# Patient Record
Sex: Female | Born: 1975 | Hispanic: No | Marital: Married | Smoking: Never smoker
Health system: Southern US, Community
[De-identification: ages and names within clinical notes are randomized; demographics above are authoritative.]

## PROBLEM LIST (undated history)

## (undated) DIAGNOSIS — N939 Abnormal uterine and vaginal bleeding, unspecified: Secondary | ICD-10-CM

## (undated) DIAGNOSIS — G629 Polyneuropathy, unspecified: Secondary | ICD-10-CM

## (undated) DIAGNOSIS — F329 Major depressive disorder, single episode, unspecified: Secondary | ICD-10-CM

## (undated) DIAGNOSIS — L630 Alopecia (capitis) totalis: Secondary | ICD-10-CM

## (undated) DIAGNOSIS — E282 Polycystic ovarian syndrome: Secondary | ICD-10-CM

## (undated) DIAGNOSIS — Z973 Presence of spectacles and contact lenses: Secondary | ICD-10-CM

## (undated) DIAGNOSIS — D649 Anemia, unspecified: Secondary | ICD-10-CM

## (undated) DIAGNOSIS — M1990 Unspecified osteoarthritis, unspecified site: Secondary | ICD-10-CM

## (undated) DIAGNOSIS — G4733 Obstructive sleep apnea (adult) (pediatric): Secondary | ICD-10-CM

## (undated) DIAGNOSIS — G43909 Migraine, unspecified, not intractable, without status migrainosus: Secondary | ICD-10-CM

## (undated) DIAGNOSIS — K219 Gastro-esophageal reflux disease without esophagitis: Secondary | ICD-10-CM

## (undated) DIAGNOSIS — I10 Essential (primary) hypertension: Secondary | ICD-10-CM

## (undated) DIAGNOSIS — F419 Anxiety disorder, unspecified: Secondary | ICD-10-CM

## (undated) DIAGNOSIS — J302 Other seasonal allergic rhinitis: Secondary | ICD-10-CM

## (undated) DIAGNOSIS — E119 Type 2 diabetes mellitus without complications: Secondary | ICD-10-CM

## (undated) DIAGNOSIS — T7840XA Allergy, unspecified, initial encounter: Secondary | ICD-10-CM

## (undated) DIAGNOSIS — F32A Depression, unspecified: Secondary | ICD-10-CM

## (undated) DIAGNOSIS — M069 Rheumatoid arthritis, unspecified: Secondary | ICD-10-CM

## (undated) DIAGNOSIS — B0229 Other postherpetic nervous system involvement: Secondary | ICD-10-CM

## (undated) HISTORY — PX: DILATION AND CURETTAGE OF UTERUS: SHX78

## (undated) HISTORY — DX: Allergy, unspecified, initial encounter: T78.40XA

## (undated) HISTORY — PX: CHOLECYSTECTOMY: SHX55

## (undated) HISTORY — DX: Anxiety disorder, unspecified: F41.9

## (undated) HISTORY — DX: Major depressive disorder, single episode, unspecified: F32.9

## (undated) HISTORY — DX: Depression, unspecified: F32.A

## (undated) HISTORY — PX: WISDOM TOOTH EXTRACTION: SHX21

## (undated) HISTORY — DX: Essential (primary) hypertension: I10

---

## 1999-07-12 ENCOUNTER — Other Ambulatory Visit: Admission: RE | Admit: 1999-07-12 | Discharge: 1999-07-12 | Payer: Self-pay | Admitting: Obstetrics and Gynecology

## 2000-05-13 ENCOUNTER — Emergency Department (HOSPITAL_COMMUNITY): Admission: EM | Admit: 2000-05-13 | Discharge: 2000-05-14 | Payer: Self-pay | Admitting: *Deleted

## 2000-10-24 HISTORY — PX: NASAL SEPTUM SURGERY: SHX37

## 2000-11-08 ENCOUNTER — Other Ambulatory Visit: Admission: RE | Admit: 2000-11-08 | Discharge: 2000-11-08 | Payer: Self-pay | Admitting: Family Medicine

## 2001-03-06 ENCOUNTER — Encounter: Admission: RE | Admit: 2001-03-06 | Discharge: 2001-05-23 | Payer: Self-pay | Admitting: Internal Medicine

## 2001-09-03 ENCOUNTER — Emergency Department (HOSPITAL_COMMUNITY): Admission: EM | Admit: 2001-09-03 | Discharge: 2001-09-03 | Payer: Self-pay | Admitting: Emergency Medicine

## 2001-10-17 ENCOUNTER — Emergency Department (HOSPITAL_COMMUNITY): Admission: EM | Admit: 2001-10-17 | Discharge: 2001-10-18 | Payer: Self-pay | Admitting: *Deleted

## 2001-10-24 ENCOUNTER — Emergency Department (HOSPITAL_COMMUNITY): Admission: EM | Admit: 2001-10-24 | Discharge: 2001-10-25 | Payer: Self-pay | Admitting: *Deleted

## 2001-12-14 ENCOUNTER — Emergency Department (HOSPITAL_COMMUNITY): Admission: EM | Admit: 2001-12-14 | Discharge: 2001-12-15 | Payer: Self-pay | Admitting: Emergency Medicine

## 2002-01-06 ENCOUNTER — Emergency Department (HOSPITAL_COMMUNITY): Admission: EM | Admit: 2002-01-06 | Discharge: 2002-01-06 | Payer: Self-pay | Admitting: Emergency Medicine

## 2002-02-05 ENCOUNTER — Emergency Department (HOSPITAL_COMMUNITY): Admission: EM | Admit: 2002-02-05 | Discharge: 2002-02-05 | Payer: Self-pay | Admitting: Emergency Medicine

## 2003-04-03 ENCOUNTER — Encounter: Payer: Self-pay | Admitting: Emergency Medicine

## 2003-04-03 ENCOUNTER — Emergency Department (HOSPITAL_COMMUNITY): Admission: EM | Admit: 2003-04-03 | Discharge: 2003-04-03 | Payer: Self-pay | Admitting: Emergency Medicine

## 2003-04-24 ENCOUNTER — Encounter: Payer: Self-pay | Admitting: Surgery

## 2003-04-24 ENCOUNTER — Encounter (INDEPENDENT_AMBULATORY_CARE_PROVIDER_SITE_OTHER): Payer: Self-pay | Admitting: Specialist

## 2003-04-24 ENCOUNTER — Observation Stay (HOSPITAL_COMMUNITY): Admission: RE | Admit: 2003-04-24 | Discharge: 2003-04-25 | Payer: Self-pay | Admitting: Surgery

## 2003-04-24 HISTORY — PX: LAPAROSCOPIC CHOLECYSTECTOMY: SUR755

## 2004-02-18 ENCOUNTER — Emergency Department (HOSPITAL_COMMUNITY): Admission: EM | Admit: 2004-02-18 | Discharge: 2004-02-18 | Payer: Self-pay | Admitting: Emergency Medicine

## 2006-08-08 ENCOUNTER — Ambulatory Visit (HOSPITAL_COMMUNITY): Admission: RE | Admit: 2006-08-08 | Discharge: 2006-08-08 | Payer: Self-pay | Admitting: Family Medicine

## 2007-06-01 ENCOUNTER — Ambulatory Visit (HOSPITAL_COMMUNITY): Admission: RE | Admit: 2007-06-01 | Discharge: 2007-06-01 | Payer: Self-pay | Admitting: Emergency Medicine

## 2008-07-30 ENCOUNTER — Emergency Department (HOSPITAL_COMMUNITY): Admission: EM | Admit: 2008-07-30 | Discharge: 2008-07-30 | Payer: Self-pay | Admitting: Emergency Medicine

## 2010-11-10 LAB — CBC
HCT: 37.9 % (ref 36.0–46.0)
Hemoglobin: 12.3 g/dL (ref 12.0–15.0)
MCH: 25.2 pg — ABNORMAL LOW (ref 26.0–34.0)
MCHC: 32.5 g/dL (ref 30.0–36.0)
MCV: 77.5 fL — ABNORMAL LOW (ref 78.0–100.0)
Platelets: 436 10*3/uL — ABNORMAL HIGH (ref 150–400)
RBC: 4.89 MIL/uL (ref 3.87–5.11)
RDW: 13.8 % (ref 11.5–15.5)
WBC: 12.8 10*3/uL — ABNORMAL HIGH (ref 4.0–10.5)

## 2010-11-10 LAB — BASIC METABOLIC PANEL
BUN: 9 mg/dL (ref 6–23)
CO2: 26 mEq/L (ref 19–32)
Calcium: 9.5 mg/dL (ref 8.4–10.5)
Chloride: 102 mEq/L (ref 96–112)
Creatinine, Ser: 0.62 mg/dL (ref 0.4–1.2)
GFR calc Af Amer: >60 mL/min (ref >60)
GFR calc non Af Amer: >60 mL/min (ref >60)
Glucose, Bld: 108 mg/dL — ABNORMAL HIGH (ref 70–99)
Potassium: 3.6 mEq/L (ref 3.5–5.1)
Sodium: 137 mEq/L (ref 135–145)

## 2010-11-15 ENCOUNTER — Ambulatory Visit (HOSPITAL_COMMUNITY)
Admission: RE | Admit: 2010-11-15 | Discharge: 2010-11-15 | Payer: Self-pay | Source: Home / Self Care | Attending: Obstetrics and Gynecology | Admitting: Obstetrics and Gynecology

## 2010-11-15 HISTORY — PX: HYSTEROSCOPY WITH D & C: SHX1775

## 2010-11-15 NOTE — H&P (Signed)
NAMEMARIO, [name]                 ACCOUNT NO.:  [id number]  MEDICAL RECORD NO.:  [id number]        PATIENT TYPE:  WAMB  LOCATION:                                FACILITY:  [facility]  PHYSICIAN:  Aprille Sawhney A. Richad Ramsay, M.D. DATE OF BIRTH:  04/26/1977  DATE OF ADMISSION:  11/15/2010 DATE OF DISCHARGE:                             HISTORY & PHYSICAL   DIAGNOSES:  Endometrial polyps.  The patient desires a dilation and curettage, hysteroscopy and polypectomy.  The patient is a 35 year old, gravida 1, para 0 who presented to me complaining of very irregular menses.  The patient was without a menses from 2008-2009.  At that time, it was very stressful time in her life because her father passed away.  Her periods restarted in 2009, but she only had 5 periods that year, then her periods restarted in June 2011, and she had a period started September 03, 2010, and she continued to bleed until October 04, 2010.  She said she was changing pads 2 times a day.  Denied any bleeding disorders or shortness of breath or chest pain.  She denies being on any contraception.  No history of fibroids or hormone therapy.  She did have some new medications.  No menopausal symptoms, vaginal discharge, abdominal pain or stress.  The patient had an endometrial biopsy which was significant for chronic endometritis and LEEP secretory endometrium.  Her uterus measures 6.6 x 5.37 x 3.51 with normal ovaries.  On sonohysterogram, there were 2 masses, one measuring 1.2 x 1.16 and two measuring 1.13 x 0.31.  The patient is allergic to [facility] and PLENDIL.  PAST MEDICAL HISTORY:  Significant for diabetes, chronic hypertension and diverticulitis.  Her medications include atenolol, hydrochlorothiazide, Bella-phenobarb, metformin, Zyrtec and vitamin.  SOCIAL HISTORY:  Negative for alcohol, tobacco and drug use.  The patient is married.  PHYSICAL EXAMINATION:  VITAL SIGNS:  She weighs 203 pounds, her height is 5 feet 2  inches, her pulse is 72 and her blood pressure is 110/83. EYES:  Her pupils are equal. EARS:  Her hearing is normal. NECK:  Throat is clear.  She has 4 ball spots in her head.  Thyroid is not enlarged. CARDIOVASCULAR:  Regular rate and rhythm. CHEST:  Clear to auscultation bilaterally. BREASTS:  No masses, discharge, skin changes or nipple retraction. BACK:  No CVA tenderness. ABDOMEN:  Nontender without any masses or organomegaly. EXTREMITIES:  No cyanosis, clubbing or edema. NEUROLOGIC:  Within normal limits. PELVIC:  Full vaginal exam is within normal limits.  Cervix are nontender without any lesions.  Uterus is normal, shape, size, consistency and nontender.  Adnexa has no masses.  Urine pregnancy test was negative.  ASSESSMENT:  Abnormal vaginal bleeding with polyps.  The patient was given the option of endometritis.  PERTINENT LABORATORY DATA:  Her FSH was 7.2, prolactin was 11.9, testosterone was normal at 30.61 and a Pap smear was within normal limits.  The patient was given the option of observation with Provera for 3 months and then repeat sonohysterogram or D and C, hysteroscopy, polypectomy.  She has chosen the latter.  She understands the risk  of, but not limited to bleeding, infection, damage to internal organs such as bowel, bladder and major blood vessels.  She was also referred to Dr. 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     Arleatha Philipps A. Normand Sloop, M.D.     NAD/MEDQ  D:  11/14/2010  T:  11/15/2010  Job:  [id number]  Electronically Signed by Jaymes Graff M.D. on 11/15/2010 03:04:35 PM

## 2010-11-16 LAB — GLUCOSE, CAPILLARY
Glucose-Capillary: 127 mg/dL — ABNORMAL HIGH (ref 70–99)
Glucose-Capillary: 101 mg/dL — ABNORMAL HIGH (ref 70–99)

## 2010-11-16 LAB — HCG, QUANTITATIVE, PREGNANCY: hCG, Beta Chain, Quant, S: <2 m[IU]/mL (ref <5)

## 2010-12-01 NOTE — Op Note (Signed)
  NAME:  [name], CARMICKLE       ACCOUNT NO.:  [id number]  MEDICAL RECORD NO.:  [id number]          PATIENT TYPE:  AMB  LOCATION:  SDC                           [facility]ACILITY:  [facility]  PHYSICIAN:  Chevi Lim A. Xayla Puzio, M.D. DATE OF BIRTH:  February 21, 1976  DATE OF PROCEDURE:  11/15/2010 DATE OF DISCHARGE:                              OPERATIVE REPORT   PREOPERATIVE DIAGNOSES: 1. Endometrial polyps. 2. Irregular vaginal bleeding.  POSTOPERATIVE DIAGNOSIS:  Irregular vaginal bleeding.  PROCEDURE:  D and C hysteroscopy.  SURGEON:  Karry Causer A. Taraji Mungo, MD  ASSISTANTS:  There are no assistants.  ANESTHESIA:  General and local.  FINDINGS:  Scant endometrium.  Both ostia were visualized.  There were no intracavitary lesions.  Endometrial curettings were sent to Pathology.  ESTIMATED BLOOD LOSS:  Minimal.  DEFICIT:  45 mL.  COMPLICATIONS:  There were no complications.  The patient went to PACU in stable condition.  PROCEDURE IN DETAIL:  The patient was taken to the operating room where she was given general LMA anesthesia, placed in dorsal lithotomy position and prepped and draped in a normal sterile fashion.  In-and-out catheter was used to drain the bladder.  The uterus was noted to be anteverted with no adnexal masses.  A bivalve speculum was placed into the vagina.  The anterior lip of the cervix was grasped with single- tooth tenaculum.  Cervical block was obtained with 20 mL of 1% Xylocaine.  The cervix was dilated with Pratt dilators up to 21.  The hysteroscope was placed in to the uterine cavity.  Both the cervix and uterus were noted to be normal.  There was scant endometrium noted. Both ostia were visualized.  There were no intracavitary lesions.  The hysteroscope was removed.  A sharp curettage was done on all 4 walls of the uterus.  The endometrium was obtained and sent to Pathology.  All instruments were removed from the cervix and vagina without difficulty.  The tenaculum was  removed and there was some bleeding at the tenaculum site which was made hemostatic with pressure and silver nitrate.  Sponge, lap, and needle counts were correct.  The patient went to recovery room in stable condition.     Makhia Vosler A. Normand Sloop, M.D.     NAD/MEDQ  D:  11/15/2010  T:  11/16/2010  Job:  [id number]  Electronically Signed by Jaymes Graff M.D. on 12/01/2010 10:15:54 AM

## 2011-03-11 NOTE — Op Note (Signed)
[name], [name]                         ACCOUNT NO.:  [id number]   MEDICAL RECORD NO.:  [id number]                   PATIENT TYPE:  AMB   LOCATION:  DAY                                  FACILITY:  [facility]   PHYSICIAN:  Sandria Bales. Ezzard Standing, M.D.               DATE OF BIRTH:  03/12/1976   DATE OF PROCEDURE:  04/24/2003  DATE OF DISCHARGE:                                 OPERATIVE REPORT   PREOPERATIVE DIAGNOSIS:  Chronic cholecystitis and cholelithiasis.   POSTOPERATIVE DIAGNOSIS:  Chronic cholecystitis and cholelithiasis.   PROCEDURE:  Laparoscopic cholecystectomy with intraoperative cholangiogram.   SURGEON:  Christopher E. Ezzard Standing, M.D.   FIRST ASSISTANT:  ______________.   ANESTHESIA:  General endotracheal.   ESTIMATED BLOOD LOSS:  Minimal.   INDICATION FOR PROCEDURE:  Ms. [name] is a 35 year old white female who has  had symptomatic cholelithiasis.  Discussion carried out with her about  cholecystectomy as treatment for gallbladder disease.  We discussed the  indications and potential complications of the procedure, potential  complications included but not limited to bleeding, infection, bile duct  injury, bowel injury, open surgery.   The patient presents to the operating room for laparoscopic cholecystectomy.   OPERATIVE NOTE:  Patient placed in supine position, given 1 g Ancef at the  initiation of the procedure.  She had PAS stockings in place.  Her abdomen  is prepped with Betadine solution and sterilely draped.   An initial incision into the abdomen, an infraumbilical incision with sharp  dissection carried down to the abdominal cavity.  A 12 mm Hasson trocar was  secured with a 0 Vicryl suture and abdominal exploration carried out.  The  right and left lobes of the liver were unremarkable, the stomach wall was  unremarkable.  The remainder of the bowel was covered with omentum, and  there really was not much other to visualize.  Three additional trocars were  placed, a 10 mm Ethicon trocar at the subxiphoid location and two 5 mm  Ethicon trocars in the right upper quadrant location.   The gallbladder was identified and grasped and rotated cephalad.  The  patient had a healthy anterior and posterior branch of the cystic artery,  which were triply clipped and divided.  I then placed a clip on the  gallbladder side of the cystic duct and an intraoperative cholangiogram was  obtained.  The intraoperative cholangiogram was obtained using cut-off Taut  catheter inserted through a 14-gauge Jelco into the side of the cut cystic  duct.  An intraoperative cholangiogram was obtained using half-strength  Hypaque solution, about 8 mL.  This was injected under fluoroscopy, and this  showed contrast flowing freely into the cystic duct, into the common bile  duct, into the duodenum, and up the hepatic radicles.  This was read to be a  normal intraoperative cholangiogram.  There was no filling defect or mass.   The Taut  catheter was then removed.  The cystic duct was triply Endoclipped  and divided.  The gallbladder was then sharply and bluntly dissected from  the gallbladder bed.  There was a third branch of the vascular system going  up the gallbladder bed posteriorly, and this was clipped and divided.  Prior  to complete division from the gallbladder bed, the triangle of Calot and the  gallbladder bed were visualized.  There was no bleeding, no bile leak.  The  gallbladder was then delivered into an EndoCatch bag and delivered through  the umbilicus.  The patient had at least six or seven almost 2+ cm stones,  and these stones were collected for the patient.  The gallbladder was then  sent to pathology. The abdomen was irrigated with about a liter of saline,  the umbilical wound closed with 3-0 Vicryl suture.  Each trocar site was  infiltrated with 0.25% Marcaine and the skin edge was closed with 5-0  Vicryl, painted with tincture of Benzoin and  steri-stripped.   The patient tolerated the procedure well and was transported to the recovery  room in good condition.                                               Sandria Bales. Ezzard Standing, M.D.    DHN/MEDQ  D:  04/24/2003  T:  04/24/2003  Job:  [id number]   cc:   Harrel Lemon. Merla Riches, M.D.  [address]  [place]  [place] [address]  Fax: [phone]

## 2011-05-03 ENCOUNTER — Other Ambulatory Visit: Payer: Self-pay | Admitting: Internal Medicine

## 2011-05-03 DIAGNOSIS — E229 Hyperfunction of pituitary gland, unspecified: Secondary | ICD-10-CM

## 2011-05-03 DIAGNOSIS — R51 Headache: Secondary | ICD-10-CM

## 2011-05-07 ENCOUNTER — Ambulatory Visit
Admission: RE | Admit: 2011-05-07 | Discharge: 2011-05-07 | Disposition: A | Discharge status: Home / Self Care | Payer: BC Managed Care – PPO | Source: Ambulatory Visit | Attending: Internal Medicine | Admitting: Internal Medicine

## 2011-05-07 DIAGNOSIS — R51 Headache: Secondary | ICD-10-CM

## 2011-05-07 DIAGNOSIS — E229 Hyperfunction of pituitary gland, unspecified: Secondary | ICD-10-CM

## 2011-05-07 MED ORDER — GADOBENATE DIMEGLUMINE 529 MG/ML IV SOLN: 15.0000 mL | Freq: Once | INTRAVENOUS | Status: AC | PRN

## 2011-11-20 ENCOUNTER — Ambulatory Visit (INDEPENDENT_AMBULATORY_CARE_PROVIDER_SITE_OTHER): Payer: BC Managed Care – PPO

## 2011-11-20 DIAGNOSIS — G43009 Migraine without aura, not intractable, without status migrainosus: Secondary | ICD-10-CM

## 2011-11-27 ENCOUNTER — Emergency Department (HOSPITAL_COMMUNITY)
Admission: EM | Admit: 2011-11-27 | Discharge: 2011-11-28 | Disposition: A | Discharge status: Home / Self Care | Payer: BC Managed Care – PPO | Attending: Emergency Medicine | Admitting: Emergency Medicine

## 2011-11-27 ENCOUNTER — Encounter (HOSPITAL_COMMUNITY): Payer: Self-pay | Admitting: *Deleted

## 2011-11-27 DIAGNOSIS — G43909 Migraine, unspecified, not intractable, without status migrainosus: Secondary | ICD-10-CM | POA: Insufficient documentation

## 2011-11-27 MED ORDER — METOCLOPRAMIDE HCL 5 MG/ML IJ SOLN
10.0000 mg | Freq: Once | INTRAMUSCULAR | Status: AC
  Administered 2011-11-27: 10 mg via INTRAVENOUS
  Filled 2011-11-27: qty 2

## 2011-11-27 MED ORDER — HYDROMORPHONE HCL PF 1 MG/ML IJ SOLN
1.0000 mg | Freq: Once | INTRAMUSCULAR | Status: AC
  Administered 2011-11-27: 1 mg via INTRAVENOUS
  Filled 2011-11-27: qty 1

## 2011-11-27 MED ORDER — DIPHENHYDRAMINE HCL 50 MG/ML IJ SOLN
25.0000 mg | Freq: Once | INTRAMUSCULAR | Status: AC
Start: 2011-11-27 — End: 2011-11-27
  Administered 2011-11-27: 25 mg via INTRAVENOUS
  Filled 2011-11-27: qty 1

## 2011-11-27 NOTE — ED Provider Notes (Signed)
36 year old female with a history of migraines had onset this morning of a bifrontal headache typical of her migraines. Pain is severe and throbbing. There's been associated nausea and vomiting. She will be given IV fluids and a headache cocktail and reassessed.  Dione Booze, MD 11/27/11 [phone]

## 2011-11-27 NOTE — ED Provider Notes (Signed)
History     CSN: [id number]  Arrival date & time 11/27/11  2223   First MD Initiated Contact with Patient 11/27/11 2248      Chief Complaint  Patient presents with  . Migraine    (Consider location/radiation/quality/duration/timing/severity/associated sxs/prior treatment) HPI Comments: Patient with history of migraine headaches presents with frontal migraine that started yesterday. The pain is described as throbbing. It is similar to previous headaches. Patient has taken her typical prescribed medications for migraines but these have not helped. She also took a Zofran and Vicodin today. Patient had a negative MRI last year. Patient has photophobia and phonophobia. No vomiting. Denies head injury. Patient states that dilaudid shots have worked well for her in the past. She has also been treated with Nubain in the past. She is not recall if she has been treated with Reglan.  Patient is a 36 y.o. female presenting with migraine. The history is provided by the patient.  Migraine This is a recurrent problem. The current episode started yesterday. The problem has been unchanged. Associated symptoms include headaches and nausea. Pertinent negatives include no fever, neck pain, numbness, vomiting or weakness. The symptoms are aggravated by nothing.    Past Medical History  Diagnosis Date  . Migraines     History reviewed. No pertinent past surgical history.  History reviewed. No pertinent family history.  History  Substance Use Topics  . Smoking status: Not on file  . Smokeless tobacco: Not on file  . Alcohol Use:     OB History    Grav Para Term Preterm Abortions TAB SAB Ect Mult Living                  Review of Systems  Constitutional: Negative for fever.  HENT: Negative for neck pain and neck stiffness.   Eyes: Positive for photophobia. Negative for pain and visual disturbance.  Gastrointestinal: Positive for nausea. Negative for vomiting.  Neurological: Positive for  headaches. Negative for dizziness, weakness, light-headedness and numbness.  Psychiatric/Behavioral: Negative for confusion.    Allergies  Review of patient's allergies indicates no known allergies.  Home Medications   Current Outpatient Rx  Name Route Sig Dispense Refill  . ATENOLOL 25 MG PO TABS Oral Take 25 mg by mouth daily.    Marland Kitchen OMEPRAZOLE 10 MG PO CPDR Oral Take 10 mg by mouth daily.      BP 145/97  Temp(Src) 98.4 F (36.9 C) (Oral)  Resp 19  SpO2 100%  LMP 11/15/2011  Physical Exam  Nursing note and vitals reviewed. Constitutional: She is oriented to person, place, and time. She appears well-developed and well-nourished.  HENT:  Head: Normocephalic and atraumatic.  Right Ear: External ear normal.  Left Ear: External ear normal.  Nose: Nose normal.  Mouth/Throat: Oropharynx is clear and moist.  Eyes: Conjunctivae and EOM are normal. Pupils are equal, round, and reactive to light. Right eye exhibits no discharge. Left eye exhibits no discharge.  Neck: Normal range of motion. Neck supple.  Cardiovascular: Normal rate, regular rhythm and normal heart sounds.   Pulmonary/Chest: Effort normal and breath sounds normal.  Neurological: She is alert and oriented to person, place, and time. She has normal strength. No cranial nerve deficit or sensory deficit. Coordination normal. GCS eye subscore is 4. GCS verbal subscore is 5. GCS motor subscore is 6.  Skin: Skin is warm and dry.  Psychiatric: She has a normal mood and affect.    ED Course  Procedures (including critical care time)  Labs Reviewed - No data to display No results found.   1. Migraine     11:05 PM Patient seen and examined. Medications ordered.   Vital signs reviewed and are as follows: Filed Vitals:   11/27/11 2240  BP: 145/97  Temp: 98.4 F (36.9 C)  Resp: 19   12:28 AM patient seen and reexamined. Exam stable. With treatments the pain decreased to 1/10. Patient states that she is ready for  discharge. Urged followup with her primary care physician for management of her headaches. She verbalizes understanding and agrees with plan.   MDM  Migraine, typical of previous migraines. Pain controlled in emergency department. Patient has followup for migraines. No concern for intracranial injury or process that is causing her headaches. Patient had MRI of her head that was normal in the past year.        Carolee Rota, [place] 11/28/11 0030

## 2011-11-27 NOTE — ED Notes (Signed)
Pt has a severe headache that is similar to the migraines she has experienced since she was 17 and has been diagnosed with and is treated for.  Pt states that she has used her usual medications and nothing has worked so far.

## 2011-11-28 NOTE — ED Provider Notes (Signed)
Medical screening examination/treatment/procedure(s) were conducted as a shared visit with non-physician practitioner(s) and myself.  I personally evaluated the patient during the encounter   Dione Booze, MD 11/28/11 [phone]

## 2011-12-13 ENCOUNTER — Other Ambulatory Visit: Payer: Self-pay | Admitting: Internal Medicine

## 2011-12-23 DIAGNOSIS — Z0271 Encounter for disability determination: Secondary | ICD-10-CM

## 2011-12-28 ENCOUNTER — Encounter: Payer: Self-pay | Admitting: Internal Medicine

## 2012-01-27 ENCOUNTER — Ambulatory Visit (INDEPENDENT_AMBULATORY_CARE_PROVIDER_SITE_OTHER): Payer: BC Managed Care – PPO | Admitting: Physician Assistant

## 2012-01-27 VITALS — BP 147/86 | HR 105 | Temp 98.1°F | Resp 18 | Ht 62.5 in | Wt 207.0 lb

## 2012-01-27 DIAGNOSIS — H9209 Otalgia, unspecified ear: Secondary | ICD-10-CM

## 2012-01-27 DIAGNOSIS — H6500 Acute serous otitis media, unspecified ear: Secondary | ICD-10-CM

## 2012-01-27 DIAGNOSIS — H6590 Unspecified nonsuppurative otitis media, unspecified ear: Secondary | ICD-10-CM

## 2012-01-27 MED ORDER — IPRATROPIUM BROMIDE 0.06 % NA SOLN: 2.0000 | Freq: Three times a day (TID) | NASAL | Status: DC

## 2012-01-27 NOTE — Progress Notes (Signed)
[name] MRN: [id number], DOB: 12-02-1975, 36 y.o. Date of Encounter: 01/27/2012, 4:04 PM  Primary Physician: No primary provider on file.  Chief Complaint:  Chief Complaint  Patient presents with  . Otalgia    left ear pain for 3 to 4 days    HPI: 36 y.o. year old female presents with a 3-4 day history of left greater than right otalgia. Typically with allergies this time of year. Symptoms began after cleaning a very dusty/dirty room in her house. Afebrile. Mild rhinorrhea. No cough. Left ear more painful than the right ear, but both feel like she is swimming under water, leading to sensation of muffled hearing. No drainage or discharge from affected ear. Has tried OTC cold preps without success. No GI complaints. Appetite normal.  No sick contacts, recent antibiotics, or recent travels.    No past medical history on file.   Home Meds: Prior to Admission medications   Medication Sig Start Date End Date Taking? Authorizing Provider  atenolol (TENORMIN) 25 MG tablet Take 25 mg by mouth daily. Takes 1/2 pill   Yes Historical Provider, MD  cetirizine (ZYRTEC) 10 MG tablet Take 10 mg by mouth daily.   Yes Historical Provider, MD  drospirenone-ethinyl estradiol (YAZ,GIANVI,LORYNA) 3-0.02 MG tablet Take 1 tablet by mouth daily.   Yes Historical Provider, MD  eletriptan (RELPAX) 20 MG tablet One tablet by mouth at onset of headache. May repeat in 2 hours if headache persists or recurs. may repeat in 2 hours if necessary   Yes Historical Provider, MD  HYDROcodone-acetaminophen (NORCO) 5-325 MG per tablet Take 1 tablet by mouth every 6 (six) hours as needed.   Yes Historical Provider, MD  omeprazole (PRILOSEC) 40 MG capsule Take 40 mg by mouth daily.   Yes Historical Provider, MD  ipratropium (ATROVENT) 0.06 % nasal spray Place 2 sprays into the nose 3 (three) times daily. 01/27/12 01/26/13  Sondra Barges, [name]    Allergies:  Allergies  Allergen Reactions  . Darvocet  (Propoxacet-N)   . Imitrex (Sumatriptan Base)   . Plendil     History   Social History  . Marital Status: Married    Spouse Name: N/A    Number of Children: N/A  . Years of Education: N/A   Occupational History  . Not on file.   Social History Main Topics  . Smoking status: Never Smoker   . Smokeless tobacco: Not on file  . Alcohol Use: Not on file  . Drug Use: Not on file  . Sexually Active: Not on file   Other Topics Concern  . Not on file   Social History Narrative  . No narrative on file     Review of Systems: Constitutional: negative for chills, fever, night sweats or weight changes HEENT: see above Respiratory: negative for hemoptysis, wheezing, or shortness of breath Abdominal: negative for abdominal pain, nausea, vomiting or diarrhea Dermatological: negative for rash Neurologic: negative for headache   Physical Exam: Blood pressure 147/86, pulse 105, temperature 98.1 F (36.7 C), temperature source Oral, resp. rate 18, height 5' 2.5" (1.588 m), weight 207 lb (93.895 kg)., Body mass index is 37.26 kg/(m^2). General: Well developed, well nourished, in no acute distress. Head: Normocephalic, atraumatic, eyes without discharge, sclera non-icteric, nares are congested. Bilateral auditory canals clear. Bilateral TM's with serous effusion behind. No perforation visualized. TM's pearly grey bilaterally. Oral cavity moist, dentition normal. Posterior pharynx with post nasal drip. No erythema, peritonsillar abscess, or tonsillar  exudate. Neck: Supple. No thyromegaly. Full ROM. No lymphadenopathy. Lungs: Clear bilaterally to auscultation without wheezes, rales, or rhonchi. Breathing is unlabored.  Heart: RRR with S1 S2. No murmurs, rubs, or gallops appreciated. Msk:  Strength and tone normal for age. Extremities: No clubbing or cyanosis. No edema. Neuro: Alert and oriented X 3. Moves all extremities spontaneously. CNII-XII grossly in tact. Psych:  Responds to questions  appropriately with a normal affect.     ASSESSMENT AND PLAN:  36 y.o. year old female with acute serous otitis media of nilateral ears without perforation or infection. -Atrovent NS 0.06% 2 sprays each nare bid prn #1 RF 2 -Mucinex  -Tylenol prn -Rest/fluids -RTC precautions -RTC 3 days if no improvement  SignedEula Listen, [name] 01/27/2012 4:04 PM

## 2012-02-15 ENCOUNTER — Other Ambulatory Visit: Payer: Self-pay | Admitting: Family Medicine

## 2012-02-15 MED ORDER — HYDROCODONE-ACETAMINOPHEN 5-325 MG PO TABS
1.0000 | ORAL_TABLET | Freq: Four times a day (QID) | ORAL | Status: DC | PRN
Start: 2012-02-15 — End: 2012-10-18

## 2012-02-28 ENCOUNTER — Ambulatory Visit: Payer: BC Managed Care – PPO

## 2012-02-28 ENCOUNTER — Ambulatory Visit (INDEPENDENT_AMBULATORY_CARE_PROVIDER_SITE_OTHER): Payer: BC Managed Care – PPO | Admitting: Internal Medicine

## 2012-02-28 VITALS — BP 135/88 | HR 123 | Temp 98.9°F | Resp 18 | Ht 62.5 in | Wt 207.0 lb

## 2012-02-28 DIAGNOSIS — R05 Cough: Secondary | ICD-10-CM

## 2012-02-28 DIAGNOSIS — G4700 Insomnia, unspecified: Secondary | ICD-10-CM

## 2012-02-28 DIAGNOSIS — G43909 Migraine, unspecified, not intractable, without status migrainosus: Secondary | ICD-10-CM

## 2012-02-28 DIAGNOSIS — J9801 Acute bronchospasm: Secondary | ICD-10-CM

## 2012-02-28 DIAGNOSIS — R42 Dizziness and giddiness: Secondary | ICD-10-CM

## 2012-02-28 MED ORDER — ALBUTEROL SULFATE HFA 108 (90 BASE) MCG/ACT IN AERS: 2.0000 | INHALATION_SPRAY | Freq: Four times a day (QID) | RESPIRATORY_TRACT | Status: DC | PRN

## 2012-02-28 MED ORDER — HYDROCODONE-ACETAMINOPHEN 5-325 MG PO TABS: 1.0000 | ORAL_TABLET | Freq: Four times a day (QID) | ORAL | Status: DC | PRN

## 2012-02-28 MED ORDER — ATENOLOL 25 MG PO TABS: 25.0000 mg | ORAL_TABLET | Freq: Every day | ORAL | Status: DC

## 2012-02-28 MED ORDER — CLONAZEPAM 0.5 MG PO TABS: 0.5000 mg | ORAL_TABLET | Freq: Every evening | ORAL | Status: DC | PRN

## 2012-02-28 NOTE — Progress Notes (Signed)
  Subjective:    [name], female    DOB: [birth year], 36 y.o.   MRN: [id number]  HPI Episodes of feeling shakey and dizzy 3 times this morning. First episode was in the shower. She suspected hypoglycemia and took sugar and lay down. The symptoms resolved but then recurred when she got up to move. There was no chest pain shortness of breath or palpitations. Her vision was not altered. She is not having a migraine headache. The second episode resolve with rest but then it happened again before her husband got home.  Recent cough For 5-7 days-nonproductive-no dyspnea on exertion or shortness of breath-occasionally worse at night. 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 So sleeping only 4-5 hours a night and feeling stressed.  Chest tightness Noted for the last 2 days episodically without clear shortness of breath.  C. Allergic problems causing serous otitis last month that resolved with treatment. 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 Now her hair seems to be starting to regrow After treatment with Dr. Zella Ball acupunc/adjustments/probiotics/Supplements-he wants her to avoid all prednisone and although biotics is much as possible  Her migraine headaches have been good except for one problematic. From April 21 to the 25th when she couldn't get the headache to break and she had to miss work for 4 days     Objective:   Physical Exam Vital signs revealed pulse to be 123/recheck in exam room is 105 HEENT is clear including pupils and extraocular movements/nares are boggy without purulent discharge The heart is regular without murmurs rubs or gallops/her heart was rechecked several times during the  exam and the pulse continued to come down until an accident it was 82 Lungs clear except very mild wheeze with forced expiration Neurological-cranial nerves II through XII are intact Romberg negative, deep tendon reflexes symmetrical   [facility] reading (PRIMARY) by  Dr. Yamir Carignan=No active infiltrate/no cardiomegaly    Assessment & Plan:  Problem #1 allergic rhinitis with mild allergic bronchospasm 2 use Ventolin as needed Continue Atrovent nasal spray and Zyrtec  Problem #2 dizziness episodes related to sleep deprivation Klonopin 0.5 mg at bedtime to ensure 7-8 hours sleep sessions She will work on decreasing her stress  Problem #3 migraine headaches Refill hydrocodone and Tenormin  Followup in 6 days if not well

## 2012-03-25 ENCOUNTER — Ambulatory Visit (INDEPENDENT_AMBULATORY_CARE_PROVIDER_SITE_OTHER): Payer: BC Managed Care – PPO | Admitting: Internal Medicine

## 2012-03-25 VITALS — BP 133/83 | HR 99 | Temp 99.3°F | Resp 16 | Ht 62.5 in | Wt 206.0 lb

## 2012-03-25 DIAGNOSIS — E119 Type 2 diabetes mellitus without complications: Secondary | ICD-10-CM | POA: Insufficient documentation

## 2012-03-25 DIAGNOSIS — H9209 Otalgia, unspecified ear: Secondary | ICD-10-CM

## 2012-03-25 DIAGNOSIS — K219 Gastro-esophageal reflux disease without esophagitis: Secondary | ICD-10-CM | POA: Insufficient documentation

## 2012-03-25 DIAGNOSIS — G43009 Migraine without aura, not intractable, without status migrainosus: Secondary | ICD-10-CM

## 2012-03-25 DIAGNOSIS — Z6837 Body mass index (BMI) 37.0-37.9, adult: Secondary | ICD-10-CM | POA: Insufficient documentation

## 2012-03-25 DIAGNOSIS — L658 Other specified nonscarring hair loss: Secondary | ICD-10-CM

## 2012-03-25 DIAGNOSIS — H9203 Otalgia, bilateral: Secondary | ICD-10-CM

## 2012-03-25 DIAGNOSIS — G43909 Migraine, unspecified, not intractable, without status migrainosus: Secondary | ICD-10-CM | POA: Insufficient documentation

## 2012-03-25 DIAGNOSIS — L630 Alopecia (capitis) totalis: Secondary | ICD-10-CM | POA: Insufficient documentation

## 2012-03-25 DIAGNOSIS — E229 Hyperfunction of pituitary gland, unspecified: Secondary | ICD-10-CM | POA: Insufficient documentation

## 2012-03-25 DIAGNOSIS — E221 Hyperprolactinemia: Secondary | ICD-10-CM | POA: Insufficient documentation

## 2012-03-25 DIAGNOSIS — E282 Polycystic ovarian syndrome: Secondary | ICD-10-CM | POA: Insufficient documentation

## 2012-03-25 DIAGNOSIS — J301 Allergic rhinitis due to pollen: Secondary | ICD-10-CM | POA: Insufficient documentation

## 2012-03-25 DIAGNOSIS — E249 Cushing's syndrome, unspecified: Secondary | ICD-10-CM | POA: Insufficient documentation

## 2012-03-25 NOTE — Progress Notes (Signed)
  Subjective:    [name], female    DOB: May 15, 1976, 36 y.o.   MRN: [id number]  [facility] been using headphones at work and has ear pain bilaterally/no change in hearing/no popping/serous otitis resolved with treatment in April/allergies have improved but still require daily therapy  Migraine headaches still require FMLA and new forms needed today-headaches still occur 2-4 times a month requiring one to 3 days to recover-she did respond to axert but it leaves her too sleepy to drive home from work  She enjoys her job has less stress from the job now  Has been seeing acupuncture for autoimmune hair loss and is having some recovery of underarm hair Her GYN and endocrinology consults see no reason for further hormone testing and see no other options for treatment besides intermittent steroids-she got after Iatrogenic Cushing's and does not want any steroids any more  Hemoglobin A1c was below 6 at Dr. Daune Perch office    Review of Systems  Constitutional: Negative for activity change and appetite change.       Still gets easily fatigued but has been very sedentary in the past few years  HENT: Negative for hearing loss, neck stiffness, tinnitus and ear discharge.   Eyes: Negative for visual disturbance.  Respiratory: Negative for shortness of breath and wheezing.   Cardiovascular: Negative for palpitations and leg swelling.  Psychiatric/Behavioral:       Insomnia responded well to treatment and is now intact       Objective:   Physical Exam Stable vital signs Both TMs are clear with no evidence of fluid/both canals are clear/nontender to palpation/no regional nodes Nares with watery discharge Throat clear Heart regular without murmur No peripheral edema Neurological intact Has newly grown axillary hair       Assessment & Plan:   1. Polycystic ovaries - GI intolerance to metformin/no other reason to treat at this point/they are discussing childbearing  2.  BMI 37.0-37.9,adult   3. Alopecia areata totalis -Continue acupuncture/remember that both testosterone and prolactin have been elevated with negative workups beyond that but may need reevaluation in the future  4. Allergic rhinitis due to pollen-Add Sudafed for one week to current regimen to see if the otalgia resolves   5. Migraine headache -Continue same protocol except increase atenolol to 50 mg  6. Otalgia of both ears

## 2012-04-01 ENCOUNTER — Emergency Department (HOSPITAL_COMMUNITY)
Admission: EM | Admit: 2012-04-01 | Discharge: 2012-04-01 | Disposition: A | Discharge status: Home / Self Care | Payer: BC Managed Care – PPO | Attending: Emergency Medicine | Admitting: Emergency Medicine

## 2012-04-01 ENCOUNTER — Encounter (HOSPITAL_COMMUNITY): Payer: Self-pay | Admitting: Emergency Medicine

## 2012-04-01 DIAGNOSIS — K219 Gastro-esophageal reflux disease without esophagitis: Secondary | ICD-10-CM | POA: Insufficient documentation

## 2012-04-01 DIAGNOSIS — G43909 Migraine, unspecified, not intractable, without status migrainosus: Secondary | ICD-10-CM | POA: Insufficient documentation

## 2012-04-01 HISTORY — DX: Migraine, unspecified, not intractable, without status migrainosus: G43.909

## 2012-04-01 HISTORY — DX: Polycystic ovarian syndrome: E28.2

## 2012-04-01 HISTORY — DX: Alopecia (capitis) totalis: L63.0

## 2012-04-01 HISTORY — DX: Gastro-esophageal reflux disease without esophagitis: K21.9

## 2012-04-01 MED ORDER — DEXAMETHASONE SODIUM PHOSPHATE 10 MG/ML IJ SOLN
10.0000 mg | Freq: Once | INTRAMUSCULAR | Status: DC
  Filled 2012-04-01: qty 1

## 2012-04-01 MED ORDER — SODIUM CHLORIDE 0.9 % IV BOLUS (SEPSIS)
1000.0000 mL | Freq: Once | INTRAVENOUS | Status: AC
  Administered 2012-04-01: 1000 mL via INTRAVENOUS

## 2012-04-01 MED ORDER — DIPHENHYDRAMINE HCL 50 MG/ML IJ SOLN
25.0000 mg | Freq: Once | INTRAMUSCULAR | Status: AC
  Administered 2012-04-01: 25 mg via INTRAVENOUS
  Filled 2012-04-01: qty 1

## 2012-04-01 MED ORDER — HYDROMORPHONE HCL PF 1 MG/ML IJ SOLN
1.0000 mg | Freq: Once | INTRAMUSCULAR | Status: AC
  Administered 2012-04-01: 1 mg via INTRAVENOUS
  Filled 2012-04-01: qty 1

## 2012-04-01 MED ORDER — METOCLOPRAMIDE HCL 5 MG/ML IJ SOLN
10.0000 mg | Freq: Once | INTRAMUSCULAR | Status: AC
  Administered 2012-04-01: 10 mg via INTRAVENOUS
  Filled 2012-04-01: qty 2

## 2012-04-01 NOTE — ED Notes (Signed)
Pt alert, nad, c/o migraine h/a, onset last week, pt ambulates to triage, room, steady gait noted, speech clear, per states "i need a pain medication", "toradol doesn't work for me", "i cant take dexamethasone, it will give me cushings", pt alert, resp even unlabored, skin pwd, pt states "my Vicodin, relpax, and generic nausea medication not working"

## 2012-04-01 NOTE — ED Notes (Signed)
Pt c/o frontal HA x 1 week. Pt unsure has never had HA last this long, pt has had several visits to PCP tried accupuncture. Pt also states started period 2 days ago, may be hormonal.

## 2012-04-01 NOTE — Discharge Instructions (Signed)

## 2012-04-01 NOTE — ED Provider Notes (Signed)
History     CSN: [id number]  Arrival date & time 04/01/12  2030   First MD Initiated Contact with Patient 04/01/12 2148      Chief Complaint  Patient presents with  . Migraine    (Consider location/radiation/quality/duration/timing/severity/associated sxs/prior treatment) HPI Comments: Typical for prior migraines. Gradual in onset. Not worst headache of life. No associated neurologic symptoms  Patient is a 36 y.o. female presenting with migraine. The history is provided by the patient. No language interpreter was used.  Migraine This is a new problem. The current episode started more than 2 days ago (1 week ago). The problem has been gradually worsening. Associated symptoms include headaches. Pertinent negatives include no chest pain, no abdominal pain and no shortness of breath. Exacerbated by: light and sound. The symptoms are relieved by nothing. Treatments tried: acupuncture and home meds prescribed for migraines without improvement. The treatment provided no relief.    Past Medical History  Diagnosis Date  . Migraine   . Alopecia areata totalis   . Polycystic ovary disease   . GERD (gastroesophageal reflux disease)     Past Surgical History  Procedure Date  . Dilation and curettage of uterus     No family history on file.  History  Substance Use Topics  . Smoking status: Never Smoker   . Smokeless tobacco: Not on file  . Alcohol Use: Yes     occasional    OB History    Grav Para Term Preterm Abortions TAB SAB Ect Mult Living                  Review of Systems  Constitutional: Negative for fever, chills, activity change, appetite change and fatigue.  HENT: Negative for congestion, sore throat, rhinorrhea, neck pain and neck stiffness.   Eyes: Positive for photophobia. Negative for pain and visual disturbance.  Respiratory: Negative for cough and shortness of breath.   Cardiovascular: Negative for chest pain and palpitations.  Gastrointestinal: Positive for  nausea. Negative for vomiting and abdominal pain.  Genitourinary: Negative for dysuria, urgency, frequency and flank pain.  Musculoskeletal: Negative for myalgias, back pain and arthralgias.  Neurological: Positive for headaches. Negative for dizziness, weakness, light-headedness and numbness.  All other systems reviewed and are negative.    Allergies  Darvocet; Felodipine er; and Imitrex  Home Medications   Current Outpatient Rx  Name Route Sig Dispense Refill  . ATENOLOL 25 MG PO TABS Oral Take 1 tablet (25 mg total) by mouth daily. Takes 1/2 pill 30 tablet 5  . CETIRIZINE HCL 10 MG PO TABS Oral Take 10 mg by mouth daily.    Marland Kitchen CLONAZEPAM 0.5 MG PO TABS Oral Take 0.25 mg by mouth daily.    . DROSPIRENONE-ETHINYL ESTRADIOL 3-0.02 MG PO TABS Oral Take 1 tablet by mouth daily.    Marland Kitchen ELETRIPTAN HYDROBROMIDE 20 MG PO TABS Oral One tablet by mouth at onset of headache. May repeat in 2 hours if headache persists or recurs. may repeat in 2 hours if necessary    . HYDROCODONE-ACETAMINOPHEN 5-325 MG PO TABS Oral Take 1 tablet by mouth every 6 (six) hours as needed. 30 tablet 5  . IPRATROPIUM BROMIDE 0.06 % NA SOLN Nasal Place 2 sprays into the nose 3 (three) times daily. 15 mL 2  . OMEPRAZOLE 40 MG PO CPDR Oral Take 40 mg by mouth daily.    . ALBUTEROL SULFATE HFA 108 (90 BASE) MCG/ACT IN AERS Inhalation Inhale 2 puffs into the lungs every 6 (  six) hours as needed for wheezing. 1 Inhaler 2    BP 135/74  Pulse 83  Temp(Src) 98.2 F (36.8 C) (Oral)  Resp 16  SpO2 98%  LMP 03/30/2012  Physical Exam  Nursing note and vitals reviewed. Constitutional: She is oriented to person, place, and time. She appears well-developed and well-nourished. No distress.       Appears slightly uncomfortable  HENT:  Head: Normocephalic and atraumatic.  Mouth/Throat: Oropharynx is clear and moist.  Eyes: Conjunctivae and EOM are normal. Pupils are equal, round, and reactive to light.  Neck: Normal range of  motion. Neck supple.  Cardiovascular: Normal rate, regular rhythm, normal heart sounds and intact distal pulses.  Exam reveals no gallop and no friction rub.   No murmur heard. Pulmonary/Chest: Effort normal and breath sounds normal. No respiratory distress. She exhibits no tenderness.  Abdominal: Soft. Bowel sounds are normal. There is no tenderness. There is no rebound and no guarding.  Musculoskeletal: Normal range of motion. She exhibits no edema and no tenderness.  Neurological: She is alert and oriented to person, place, and time. She has normal strength. No cranial nerve deficit or sensory deficit.  Skin: Skin is warm and dry. No rash noted.    ED Course  Procedures (including critical care time)  Labs Reviewed - No data to display No results found.   1. Migraine       MDM  Migraine headache typical for prior migraines. I have no concern about a malignant cause of headache such as subarachnoid hemorrhage or meningitis. There is no indication for imaging or testing at this time. I administered IV fluids and a headache cocktail. On receiving the medications the patient refused dexamethasone and requested something for pain. I explained that narcotics are not indicated for migraine headaches and that they will resolve and rebound headaches when administered. I explained that this would worsen her overall condition. When given this information the patient still requested medication for pain. I explained that the Reglan and Benadryl would be the therapeutic choice for her migraine however she still requested pain medication. She received a single dose of Dilaudid and was probably discharge in the emergency department. She'll not be discharged home with any additional medications.        Dayton Bailiff, MD 04/01/12 2235

## 2012-05-07 ENCOUNTER — Other Ambulatory Visit: Payer: Self-pay | Admitting: Emergency Medicine

## 2012-05-23 ENCOUNTER — Telehealth: Payer: Self-pay

## 2012-05-23 NOTE — Telephone Encounter (Signed)
Chart is in your box. I don't see where she has been on Clonezapam. Please advise.

## 2012-05-23 NOTE — Telephone Encounter (Signed)
[facility] pls advise on Clonazepam refill

## 2012-05-23 NOTE — Telephone Encounter (Signed)
The [facility] called regarding request for Clonezapam .5mg .  Please call the pharmacy back at [phone].

## 2012-05-23 NOTE — Telephone Encounter (Signed)
Please pull chart.

## 2012-05-24 NOTE — Telephone Encounter (Signed)
I can't find in her chart that this was done in the past Could the pharmacy fax [place] a copy of the written prescription or tell us the date it was written on? She is a long-time patient and has been anxious before It also might be possible that they're making mistaken she is requesting a refill of her hydrocodone

## 2012-05-24 NOTE — Telephone Encounter (Signed)
Left mssg for patient to call back, have verified with pharmacy and this is the medication requested.

## 2012-05-28 NOTE — Telephone Encounter (Signed)
Multiple attempts, patient does not call back. If she has further questions, she was advised to call on message

## 2012-06-23 ENCOUNTER — Ambulatory Visit (INDEPENDENT_AMBULATORY_CARE_PROVIDER_SITE_OTHER): Payer: BC Managed Care – PPO | Admitting: Internal Medicine

## 2012-06-23 VITALS — BP 150/102 | HR 82 | Temp 98.6°F | Resp 18 | Ht 62.5 in | Wt 212.0 lb

## 2012-06-23 DIAGNOSIS — G4700 Insomnia, unspecified: Secondary | ICD-10-CM

## 2012-06-23 DIAGNOSIS — M2550 Pain in unspecified joint: Secondary | ICD-10-CM

## 2012-06-23 LAB — POCT CBC
Granulocyte percent: 66.9 %G (ref 37–80)
HCT, POC: 43.6 % (ref 37.7–47.9)
Hemoglobin: 13.5 g/dL (ref 12.2–16.2)
Lymph, poc: 3.1 (ref 0.6–3.4)
MCH, POC: 24.6 pg — AB (ref 27–31.2)
MCHC: 31 g/dL — AB (ref 31.8–35.4)
MCV: 79.5 fL — AB (ref 80–97)
MID (cbc): 0.5 (ref 0–0.9)
MPV: 9.8 fL (ref 0–99.8)
POC Granulocyte: 7.3 — AB (ref 2–6.9)
POC LYMPH PERCENT: 28.6 %L (ref 10–50)
POC MID %: 4.5 %M (ref 0–12)
Platelet Count, POC: 523 10*3/uL — AB (ref 142–424)
RBC: 5.48 M/uL (ref 4.04–5.48)
RDW, POC: 14 %
WBC: 10.9 10*3/uL — AB (ref 4.6–10.2)

## 2012-06-23 LAB — POCT SEDIMENTATION RATE: POCT SED RATE: 31 mm/hr — AB (ref 0–22)

## 2012-06-23 MED ORDER — CLONAZEPAM 0.5 MG PO TABS: 0.2500 mg | ORAL_TABLET | Freq: Every evening | ORAL | Status: DC | PRN

## 2012-06-23 MED ORDER — MELOXICAM 15 MG PO TABS: 15.0000 mg | ORAL_TABLET | Freq: Every day | ORAL | Status: AC

## 2012-06-23 NOTE — Progress Notes (Signed)
Subjective:    [name], female    DOB: [birth year], 36 y.o.   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 Polycystic ovaries  . Other abnormal glucose  . BMI 37.0-37.9,adult  . Cushing's syndrome-iatrogenic  . Other and unspecified anterior pituitary hyperfunction  . Hyperprolactinemia  . Alopecia areata totalis  . Allergic rhinitis due to pollen  . GERD (gastroesophageal reflux disease)  . Migraine headache  Still has no recovery of hair growth  Review of Systems She has had problems with insomnia recently Her migraine headaches have not been active No fever chills or night sweats No weight loss Blood pressure has been elevated intermittently however at 50 mg of atenolol she will get blood pressures in the 100/60 range and be dizzy with postural changes    Objective:   Physical Exam Filed Vitals:   06/23/12 1551  BP: 150/102  Pulse: 82  Temp: 98.6 F (37 C)  Resp: 18    She is mildly tender over the right forearm and the left epicondyle area/Supination and pronation are intact/wrist range of motion is intact She is tender around the first [facility] joint bilaterally but has good thumb function/tinels negative The right shoulder has mild discomfort at 90 of abduction        Results for orders placed in visit on 06/23/12  POCT CBC      Component Value Range   WBC 10.9 (*) 4.6 - 10.2 K/uL   Lymph, poc 3.1  0.6 - 3.4   POC LYMPH PERCENT 28.6  10 - 50 %L   MID (cbc) 0.5  0 - 0.9   POC  MID % 4.5  0 - 12 %M   POC Granulocyte 7.3 (*) 2 - 6.9   Granulocyte percent 66.9  37 - 80 %G   RBC 5.48  4.04 - 5.48 M/uL   Hemoglobin 13.5  12.2 - 16.2 g/dL   HCT, POC 40.9  81.1 - 47.9 %   MCV 79.5 (*) 80 - 97 fL   MCH, POC 24.6 (*) 27 - 31.2 pg   MCHC 31.0 (*) 31.8 - 35.4 g/dL   RDW, POC 91.4     Platelet Count, POC 523 (*) 142 - 424 K/uL   MPV 9.8  0 - 99.8 fL    Assessment & Plan:   1. Arthralgia of multiple joints  TSH, POCT SEDIMENTATION RATE, ANA, POCT CBC, Comprehensive metabolic panel  2. Insomnia  TSH, T4, Free   Meds ordered this encounter  Medications  . clonazePAM (KLONOPIN) 0.5 MG tablet    Sig: Take 0.5 tablets (0.25 mg total) by mouth at bedtime as needed for anxiety.    Dispense:  30 tablet    Refill:  5  . meloxicam (MOBIC) 15 MG tablet    Sig: Take 1 tablet (15 mg total) by mouth daily.    Dispense:  30 tablet    Refill:  0   Call with lab results and  plan

## 2012-06-24 LAB — COMPREHENSIVE METABOLIC PANEL
ALT: 18 U/L (ref 0–35)
AST: 16 U/L (ref 0–37)
Alkaline Phosphatase: 73 U/L (ref 39–117)
Calcium: 9.5 mg/dL (ref 8.4–10.5)
Chloride: 106 mEq/L (ref 96–112)
Creat: 0.68 mg/dL (ref 0.50–1.10)

## 2012-06-24 LAB — T4, FREE: Free T4: 1.09 ng/dL (ref 0.80–1.80)

## 2012-06-24 LAB — TSH: TSH: 2.029 u[IU]/mL (ref 0.350–4.500)

## 2012-06-27 ENCOUNTER — Encounter: Payer: Self-pay | Admitting: Internal Medicine

## 2012-06-27 LAB — ANA: Anti Nuclear Antibody(ANA): NEGATIVE

## 2012-10-15 ENCOUNTER — Ambulatory Visit: Payer: BC Managed Care – PPO

## 2012-10-15 ENCOUNTER — Ambulatory Visit (INDEPENDENT_AMBULATORY_CARE_PROVIDER_SITE_OTHER): Payer: BC Managed Care – PPO | Admitting: Emergency Medicine

## 2012-10-15 VITALS — BP 134/93 | HR 105 | Temp 98.7°F | Resp 16 | Ht 62.0 in | Wt 212.0 lb

## 2012-10-15 DIAGNOSIS — J111 Influenza due to unidentified influenza virus with other respiratory manifestations: Secondary | ICD-10-CM

## 2012-10-15 DIAGNOSIS — R05 Cough: Secondary | ICD-10-CM

## 2012-10-15 DIAGNOSIS — J9801 Acute bronchospasm: Secondary | ICD-10-CM

## 2012-10-15 DIAGNOSIS — J45909 Unspecified asthma, uncomplicated: Secondary | ICD-10-CM

## 2012-10-15 LAB — POCT INFLUENZA A/B
Influenza A, POC: POSITIVE
Influenza B, POC: NEGATIVE

## 2012-10-15 MED ORDER — ALBUTEROL SULFATE (2.5 MG/3ML) 0.083% IN NEBU
2.5000 mg | INHALATION_SOLUTION | Freq: Once | RESPIRATORY_TRACT | Status: AC
  Administered 2012-10-15: 2.5 mg via RESPIRATORY_TRACT

## 2012-10-15 MED ORDER — OSELTAMIVIR PHOSPHATE 75 MG PO CAPS: 75.0000 mg | ORAL_CAPSULE | Freq: Two times a day (BID) | ORAL | Status: DC

## 2012-10-15 MED ORDER — ALBUTEROL SULFATE HFA 108 (90 BASE) MCG/ACT IN AERS: 2.0000 | INHALATION_SPRAY | Freq: Four times a day (QID) | RESPIRATORY_TRACT | Status: DC | PRN

## 2012-10-15 NOTE — Patient Instructions (Addendum)
Influenza, Adult  Influenza ("the flu") is a viral infection of the respiratory tract. It occurs more often in winter months because people spend more time in close contact with one another. Influenza can make you feel very sick. Influenza easily spreads from person to person (contagious).  CAUSES   Influenza is caused by a virus that infects the respiratory tract. You can catch the virus by breathing in droplets from an infected person's cough or sneeze. You can also catch the virus by touching something that was recently contaminated with the virus and then touching your mouth, nose, or eyes.  SYMPTOMS   Symptoms typically last 4 to 10 days and may include:   Fever.   Chills.   Headache, body aches, and muscle aches.   Sore throat.   Chest discomfort and cough.   Poor appetite.   Weakness or feeling tired.   Dizziness.   Nausea or vomiting.  DIAGNOSIS   Diagnosis of influenza is often made based on your history and a physical exam. A nose or throat swab test can be done to confirm the diagnosis.  RISKS AND COMPLICATIONS  You may be at risk for a more severe case of influenza if you smoke cigarettes, have diabetes, have chronic heart disease (such as heart failure) or lung disease (such as asthma), or if you have a weakened immune system. Elderly people and pregnant women are also at risk for more serious infections. The most common complication of influenza is a lung infection (pneumonia). Sometimes, this complication can require emergency medical care and may be life-threatening.  PREVENTION   An annual influenza vaccination (flu shot) is the best way to avoid getting influenza. An annual flu shot is now routinely recommended for all adults in the [place]  TREATMENT   In mild cases, influenza goes away on its own. Treatment is directed at relieving symptoms. For more severe cases, your caregiver may prescribe antiviral medicines to shorten the sickness. Antibiotic medicines are not effective, because the  infection is caused by a virus, not by bacteria.  HOME CARE INSTRUCTIONS   Only take over-the-counter or prescription medicines for pain, discomfort, or fever as directed by your caregiver.   Use a cool mist humidifier to make breathing easier.   Get plenty of rest until your temperature returns to normal. This usually takes 3 to 4 days.   Drink enough fluids to keep your urine clear or pale yellow.   Cover your mouth and nose when coughing or sneezing, and wash your hands well to avoid spreading the virus.   Stay home from work or school until your fever has been gone for at least 1 full day.  SEEK MEDICAL CARE IF:    You have chest pain or a deep cough that worsens or produces more mucus.   You have nausea, vomiting, or diarrhea.  SEEK IMMEDIATE MEDICAL CARE IF:    You have difficulty breathing, shortness of breath, or your skin or nails turn bluish.   You have severe neck pain or stiffness.   You have a severe headache, facial pain, or earache.   You have a worsening or recurring fever.   You have nausea or vomiting that cannot be controlled.  MAKE SURE YOU:   Understand these instructions.   Will watch your condition.   Will get help right away if you are not doing well or get worse.  Document Released: 10/07/2000 Document Revised: 04/10/2012 Document Reviewed: 01/09/2012  ExitCare Patient Information 2013 [employer]    Asthma, Acute Bronchospasm  Your exam shows you have asthma, or acute bronchospasm that acts like asthma. Bronchospasm means your air passages become narrowed. These conditions are due to inflammation and airway spasm that cause narrowing of the bronchial tubes in the lungs. This causes you to have wheezing and shortness of breath.  CAUSES   Respiratory infections and allergies most often bring on these attacks. Smoking, air pollution, cold air, emotional upsets, and vigorous exercise can also bring them on.   TREATMENT    Treatment is aimed at making the narrowed airways  larger. Mild asthma/bronchospasm is usually controlled with inhaled medicines. Albuterol is a common medicine that you breathe in to open spastic or narrowed airways. Some trade names for albuterol are Ventolin or Proventil. Steroid medicine is also used to reduce the inflammation when an attack is moderate or severe. Antibiotics (medications used to kill germs) are only used if a bacterial infection is present.   If you are pregnant and need to use Albuterol (Ventolin or Proventil), you can expect the baby to move more than usual shortly after the medicine is used.  HOME CARE INSTRUCTIONS    Rest.   Drink plenty of liquids. This helps the mucus to remain thin and easily coughed up. Do not use caffeine or alcohol.   Do not smoke. Avoid being exposed to second-hand smoke.   You play a critical role in keeping yourself in good health. Avoid exposure to things that cause you to wheeze. Avoid exposure to things that cause you to have breathing problems. Keep your medications up-to-date and available. Carefully follow your doctor's treatment plan.   When pollen or pollution is bad, keep windows closed and use an air conditioner go to places with air conditioning. If you are allergic to furry pets or birds, find new homes for them or keep them outside.   Take your medicine exactly as prescribed.   Asthma requires careful medical attention. See your caregiver for follow-up as advised. If you are more than [redacted] weeks pregnant and you were prescribed any new medications, let your Obstetrician know about the visit and how you are doing. Arrange a recheck.  SEEK IMMEDIATE MEDICAL CARE IF:    You are getting worse.   You have trouble breathing. If severe, call 911.   You develop chest pain or discomfort.   You are throwing up or not drinking fluids.   You are not getting better within 24 hours.   You are coughing up yellow, green, brown, or bloody sputum.   You develop a fever over 102 F (38.9 C).   You have  trouble swallowing.  MAKE SURE YOU:    Understand these instructions.   Will watch your condition.   Will get help right away if you are not doing well or get worse.  Document Released: 01/25/2007 Document Revised: 01/02/2012 Document Reviewed: 09/24/2007  [employer] Patient Information 2013 [employer]

## 2012-10-15 NOTE — Progress Notes (Signed)
  Subjective:    [name], female    DOB: 07/15/1976, 36 y.o.   MRN: [id number]  HPI patient became ill on Friday. She started feeling bad with head congestion scratchy throat. She then developed chest congestion cough and wheezing. She's been using for the weekend and has been using her inhaler once a day. She does not have a history of asthma but has used inhalers with illness in the past. She has had some flulike symptoms with aching associated with the scratchy throat and cough.    Review of Systems     Objective:   Physical Exam patient is a wheezing in the room. She has paroxysms of cough. Her neck is supple. Her chest examination reveals bilateral wheezes there are no areas of dullness cardiac exam reveals a regular rate without murmurs or gallops. Extremities are without edema  [facility] reading (PRIMARY) by  Dr. Cleta Alberts poor inspiration but otherwise  Results for orders placed in visit on 10/15/12  POCT INFLUENZA A/B      Component Value Range   Influenza A, POC Positive     Influenza B, POC Negative          Assessment & Plan:  Patient here with probable reactive airways disease. We'll check a flu swab and give an  albuterol treatment. Test is positive we'll treat with albuterol and Tamiflu

## 2012-10-18 ENCOUNTER — Other Ambulatory Visit: Payer: Self-pay | Admitting: Internal Medicine

## 2012-10-18 ENCOUNTER — Ambulatory Visit (INDEPENDENT_AMBULATORY_CARE_PROVIDER_SITE_OTHER): Payer: BC Managed Care – PPO | Admitting: Internal Medicine

## 2012-10-18 VITALS — BP 135/94 | HR 109 | Temp 98.1°F | Resp 16 | Ht 62.0 in | Wt 209.0 lb

## 2012-10-18 DIAGNOSIS — J189 Pneumonia, unspecified organism: Secondary | ICD-10-CM

## 2012-10-18 DIAGNOSIS — J9801 Acute bronchospasm: Secondary | ICD-10-CM

## 2012-10-18 MED ORDER — PREDNISONE 20 MG PO TABS: ORAL_TABLET | ORAL | Status: DC

## 2012-10-18 MED ORDER — AZITHROMYCIN 500 MG PO TABS: 500.0000 mg | ORAL_TABLET | Freq: Every day | ORAL | Status: DC

## 2012-10-19 NOTE — Progress Notes (Signed)
  Subjective:    [name], female    DOB: July 30, 1976, 36 y.o.   MRN: [id number]  HPIsee 12/23 influ A w/ RAD   is somewhat improved, but nail has productive cough with purulent sputum, and anterior chest wall pain with coughing. Continues with marked fatigue. No fever or night sweats. No sinus congestion. Limited response to albuterol with regard to wheezing. Has never had reactive airway disease before although has a history of allergic rhinitis.  Review of Systems No rhinitis No sore throat No palpitations or edema No nausea or vomiting/ No diarrhea No skin rash No headaches or neck stiffness    Objective:   Physical Exam Vital signs stable except overweight Conjunctiva clear TMs clear Nares with clear rhinorrhea Oral pharynx is noninjected No anterior cervical nodes Chest with wheezing bilaterally on expiration/rhonchi at the right base       Assessment & Plan:  Problem #1 pneumonia status post viral influenza Meds ordered this encounter  Medications  . azithromycin (ZITHROMAX) 500 MG tablet    Sig: Take 1 tablet (500 mg total) by mouth daily. For pneumonia    Dispense:  5 tablet    Refill:  0  . predniSONE (DELTASONE) 20 MG tablet    Sig: 3/3/2/2/1/1single daily dose for 6 days    Dispense:  12 tablet    Refill:  0   Continue albuterol Out of work until 10/25/2012 Followup 10/24/2012 if not better/resolved

## 2012-11-10 ENCOUNTER — Ambulatory Visit: Payer: BC Managed Care – PPO

## 2012-11-10 ENCOUNTER — Ambulatory Visit (INDEPENDENT_AMBULATORY_CARE_PROVIDER_SITE_OTHER): Payer: BC Managed Care – PPO | Admitting: Internal Medicine

## 2012-11-10 VITALS — BP 136/96 | HR 94 | Temp 98.5°F | Resp 18 | Wt 211.0 lb

## 2012-11-10 DIAGNOSIS — D72829 Elevated white blood cell count, unspecified: Secondary | ICD-10-CM

## 2012-11-10 DIAGNOSIS — R05 Cough: Secondary | ICD-10-CM

## 2012-11-10 DIAGNOSIS — R059 Cough, unspecified: Secondary | ICD-10-CM

## 2012-11-10 DIAGNOSIS — J9801 Acute bronchospasm: Secondary | ICD-10-CM

## 2012-11-10 LAB — COMPREHENSIVE METABOLIC PANEL
Albumin: 4.6 g/dL (ref 3.5–5.2)
CO2: 24 mEq/L (ref 19–32)
Calcium: 9.6 mg/dL (ref 8.4–10.5)
Chloride: 103 mEq/L (ref 96–112)
Glucose, Bld: 146 mg/dL — ABNORMAL HIGH (ref 70–99)
Potassium: 4.5 mEq/L (ref 3.5–5.3)
Sodium: 138 mEq/L (ref 135–145)
Total Protein: 7.7 g/dL (ref 6.0–8.3)

## 2012-11-10 LAB — POCT CBC
Granulocyte percent: 65.2 %G (ref 37–80)
HCT, POC: 45.8 % (ref 37.7–47.9)
Hemoglobin: 14.1 g/dL (ref 12.2–16.2)
Lymph, poc: 4 — AB (ref 0.6–3.4)
MCHC: 30.8 g/dL — AB (ref 31.8–35.4)
POC Granulocyte: 8.9 — AB (ref 2–6.9)
RBC: 5.68 M/uL — AB (ref 4.04–5.48)

## 2012-11-10 MED ORDER — PREDNISONE 10 MG PO TABS: ORAL_TABLET | ORAL | Status: DC

## 2012-11-10 MED ORDER — MOXIFLOXACIN HCL 400 MG PO TABS: 400.0000 mg | ORAL_TABLET | Freq: Every day | ORAL | Status: DC

## 2012-11-10 NOTE — Progress Notes (Signed)
  Subjective:    [name], female    DOB: 1976-04-18, 37 y.o.   MRN: [id number]  HPIpost hosp for syncope-[facility] via em[facility]//8d ago=1/10 Fever/cough/ht rate 140/leucocytosis No dx but high dose antibios(rocephin and vancomycin alternating Had Resumed work after pneu 1/2 feeling OK , then Cough restarted 1/8-nonproduc but very bothersome No fever or night sweats Feels slightly winded with activity Mild congestion with headaches in the sinus area   Past medical history Patient Active Problem List  Diagnosis  . Polycystic ovaries  . Other abnormal glucose  . BMI 37.0-37.9,adult  . Cushing's syndrome-iatrogenic  . Other and unspecified anterior pituitary hyperfunction  . Hyperprolactinemia  . Alopecia areata totalis  . Allergic rhinitis due to pollen  . GERD (gastroesophageal reflux disease)  . Migraine headache   Current medications include albuterol, Tenormin, omeprazole among others    Review of Systems No fever chills or night sweats No weight loss No pleuritic chest pain No shortness of breath No nausea or vomiting/appetite is decreased No peripheral edema No genitourinary symptoms No skin rash    Objective:   Physical Exam BP 136/96  Pulse 94  Temp 98.5 F (36.9 C) (Oral)  Resp 18  Wt 211 lb (95.709 kg)  SpO2 95%  LMP 08/08/2012 Conjunctiva clear/TMs clear/nares slightly boggy without mucus Throat clear/no cervical adenopathy Neck supple Lungs clear except wheezing bilaterally at the bases with forced expiration Heart regular without murmur Abdomen benign No peripheral edema/good pulses No flank tenderness Neurological intact      [facility] reading (PRIMARY) by  Dr. Aava Deland=NAD  Results for orders placed in visit on 11/10/12  POCT CBC      Component Value Range   WBC 13.6 (*) 4.6 - 10.2 K/uL   Lymph, poc 4.0 (*) 0.6 - 3.4   POC LYMPH PERCENT 29.3  10 - 50 %L   POC Granulocyte 8.9 (*) 2 - 6.9   Granulocyte percent 65.2   37 - 80 %G   RBC 5.68 (*) 4.04 - 5.48 M/uL   Hemoglobin 14.1  12.2 - 16.2 g/dL   HCT, POC 40.9  81.1 - 47.9 %   Platelet Count, POC 538 (*) 142 - 424 K/uL     Assessment & Plan:   1. Cough  POCT CBC, DG Chest 2 View, CT Chest W Contrast, moxifloxacin (AVELOX) 400 MG tablet  2. Leucocytosis  CT Chest W Contrast, Comprehensive metabolic panel, Sedimentation rate  3. Bronchospasm  predniSONE (DELTASONE) 10 MG tablet   This does not appear to be a viral illness/the most likely source of infection for what has occurred would be in the chest so we should proceed with a CT Meds ordered this encounter  Medications  . pseudoephedrine-guaifenesin (MUCINEX D) 60-600 MG per tablet    Sig: Take 1 tablet by mouth every 12 (twelve) hours.  . benzonatate (TESSALON) 100 MG capsule    Sig: Take 100 mg by mouth 2 (two) times daily as needed.  . predniSONE (DELTASONE) 10 MG tablet    Sig: 3/3/2/2/1/1 Single daily dose for 6 days    Dispense:  12 tablet    Refill:  0  . moxifloxacin (AVELOX) 400 MG tablet    Sig: Take 1 tablet (400 mg total) by mouth daily.    Dispense:  7 tablet    Refill:  0    Dr. Mike Craze [phone]  [address] [place]

## 2012-11-11 LAB — SEDIMENTATION RATE: Sed Rate: 28 mm/hr — ABNORMAL HIGH (ref 0–22)

## 2012-11-12 ENCOUNTER — Encounter: Payer: Self-pay | Admitting: Internal Medicine

## 2012-11-14 ENCOUNTER — Encounter: Payer: Self-pay | Admitting: Internal Medicine

## 2012-11-14 ENCOUNTER — Ambulatory Visit
Admission: RE | Admit: 2012-11-14 | Discharge: 2012-11-14 | Disposition: A | Discharge status: Home / Self Care | Payer: BC Managed Care – PPO | Source: Ambulatory Visit | Attending: Internal Medicine | Admitting: Internal Medicine

## 2012-11-14 DIAGNOSIS — D72829 Elevated white blood cell count, unspecified: Secondary | ICD-10-CM

## 2012-11-14 DIAGNOSIS — R05 Cough: Secondary | ICD-10-CM

## 2012-11-14 MED ORDER — IOHEXOL 300 MG/ML  SOLN
75.0000 mL | Freq: Once | INTRAMUSCULAR | Status: AC | PRN
  Administered 2012-11-14: 75 mL via INTRAVENOUS

## 2012-11-25 DIAGNOSIS — Z0271 Encounter for disability determination: Secondary | ICD-10-CM

## 2012-12-20 ENCOUNTER — Other Ambulatory Visit: Payer: Self-pay | Admitting: Internal Medicine

## 2013-01-04 ENCOUNTER — Ambulatory Visit (INDEPENDENT_AMBULATORY_CARE_PROVIDER_SITE_OTHER): Payer: BC Managed Care – PPO | Admitting: Internal Medicine

## 2013-01-04 VITALS — BP 122/84 | HR 84 | Temp 97.9°F | Resp 18 | Ht 62.0 in | Wt 214.0 lb

## 2013-01-04 DIAGNOSIS — G4700 Insomnia, unspecified: Secondary | ICD-10-CM

## 2013-01-04 DIAGNOSIS — G43909 Migraine, unspecified, not intractable, without status migrainosus: Secondary | ICD-10-CM

## 2013-01-04 DIAGNOSIS — R05 Cough: Secondary | ICD-10-CM

## 2013-01-04 DIAGNOSIS — J45909 Unspecified asthma, uncomplicated: Secondary | ICD-10-CM

## 2013-01-04 DIAGNOSIS — R059 Cough, unspecified: Secondary | ICD-10-CM

## 2013-01-04 MED ORDER — CLONAZEPAM 0.5 MG PO TABS: 0.2500 mg | ORAL_TABLET | Freq: Every evening | ORAL | Status: DC | PRN

## 2013-01-04 MED ORDER — HYDROCODONE-ACETAMINOPHEN 5-325 MG PO TABS: 1.0000 | ORAL_TABLET | Freq: Four times a day (QID) | ORAL | Status: DC | PRN

## 2013-01-04 MED ORDER — ALMOTRIPTAN MALATE 12.5 MG PO TABS: ORAL_TABLET | ORAL | Status: DC

## 2013-01-04 MED ORDER — BECLOMETHASONE DIPROPIONATE 80 MCG/ACT IN AERS: 1.0000 | INHALATION_SPRAY | Freq: Two times a day (BID) | RESPIRATORY_TRACT | Status: DC

## 2013-01-04 MED ORDER — ATENOLOL 25 MG PO TABS: 25.0000 mg | ORAL_TABLET | Freq: Every day | ORAL | Status: DC

## 2013-01-04 NOTE — Progress Notes (Signed)
  Subjective:    [name], female    DOB: 05/21/1976, 37 y.o.   MRN: [id number]  HPI  Patient comes in today with paperwork from work to have filled out for her migraines. No history. FMLA for migraines which are serious. Has had a better year home treatment but still has 1-4 headaches per month.   She has been having some coughing and congestion since being in the hospital with pneumonia. Note post hospital treatment with Avelox and prednisone in January. She got well although she  is still coughing. She is not sleeping long. Maybe 2-6 hours a night.   Her grandmother fell and broke her hip, a stroke and a heart attack. Patient has been taking care of her. Patient has been stressed out. Still trying to work. No nasal discharge or trouble breathing at night. Cough wakes her occasionally. No sore throat tho scratchy in the morning. Not hoarse  She needs refills.     Review of Systems No fever chills or night sweats  No chest pain or palpitations  no GI disturbance  no GU symptoms    Objective:   Physical Exam BP 122/84  Pulse 84  Temp(Src) 97.9 F (36.6 C) (Oral)  Resp 18  Ht 5\' 2"  (1.575 m)  Wt 214 lb (97.07 kg)  BMI 39.13 kg/m2  SpO2 98%  LMP 12/28/2012 TMs clear Nares clear Throat clear   no a.c. nodes or thyromegaly Chest clear to auscultation except mild rhonchi anteriorly but clear with cough Neuro intact       Assessment & Plan:    Problem #1 migraine syndrome  FMLA  Refill meds Problem #2 prolonged cough-suspect post infection inflammatory  Start Qvar  problem #3 insomnia  Refill Klonopin  Meds ordered this encounter  Medications  . beclomethasone (QVAR) 80 MCG/ACT inhaler    Sig: Inhale 1 puff into the lungs 2 (two) times daily.    Dispense:  1 Inhaler    Refill:  12  . atenolol (TENORMIN) 25 MG tablet    Sig: Take 1 tablet (25 mg total) by mouth daily. Takes 1/2 pill    Dispense:  30 tablet    Refill:  5  . almotriptan  (AXERT) 12.5 MG tablet    Sig: TAKE 1 TAB AT ONSET OF H/A,MAY REPEAT IN 2 HRS IF NEEDED. MAX.2 COURSES PER WEEK, 4 COURSES PER MONT    Dispense:  8 tablet    Refill:  0  . clonazePAM (KLONOPIN) 0.5 MG tablet    Sig: Take 0.5 tablets (0.25 mg total) by mouth at bedtime as needed for anxiety.    Dispense:  30 tablet    Refill:  5  . HYDROcodone-acetaminophen (NORCO/VICODIN) 5-325 MG per tablet    Sig: Take 1 tablet by mouth every 6 (six) hours as needed.    Dispense:  30 tablet    Refill:  5

## 2013-02-06 ENCOUNTER — Other Ambulatory Visit: Payer: Self-pay

## 2013-02-06 MED ORDER — IPRATROPIUM BROMIDE 0.06 % NA SOLN: 2.0000 | Freq: Three times a day (TID) | NASAL | Status: DC

## 2013-06-04 ENCOUNTER — Other Ambulatory Visit: Payer: Self-pay | Admitting: Internal Medicine

## 2013-06-05 NOTE — Telephone Encounter (Signed)
This RX request was sent to me by mistake; I have routed it to Dr. Merla Riches.

## 2013-06-06 NOTE — Telephone Encounter (Signed)
I phoned in this medication as I do not think the routing that I attempted on 06/05/13 was successful.

## 2013-08-27 ENCOUNTER — Ambulatory Visit (INDEPENDENT_AMBULATORY_CARE_PROVIDER_SITE_OTHER): Payer: BC Managed Care – PPO | Admitting: Internal Medicine

## 2013-08-27 VITALS — BP 110/70 | HR 82 | Temp 97.8°F | Resp 16 | Ht 62.0 in | Wt 217.0 lb

## 2013-08-27 DIAGNOSIS — M25561 Pain in right knee: Secondary | ICD-10-CM

## 2013-08-27 DIAGNOSIS — Z23 Encounter for immunization: Secondary | ICD-10-CM

## 2013-08-27 DIAGNOSIS — R739 Hyperglycemia, unspecified: Secondary | ICD-10-CM

## 2013-08-27 DIAGNOSIS — M25569 Pain in unspecified knee: Secondary | ICD-10-CM

## 2013-08-27 DIAGNOSIS — M2550 Pain in unspecified joint: Secondary | ICD-10-CM

## 2013-08-27 DIAGNOSIS — J019 Acute sinusitis, unspecified: Secondary | ICD-10-CM

## 2013-08-27 DIAGNOSIS — R7309 Other abnormal glucose: Secondary | ICD-10-CM

## 2013-08-27 LAB — POCT CBC
HCT, POC: 39 % (ref 37.7–47.9)
Hemoglobin: 12.1 g/dL — AB (ref 12.2–16.2)
Lymph, poc: 2.7 (ref 0.6–3.4)
MCH, POC: 24.5 pg — AB (ref 27–31.2)
MCHC: 31 g/dL — AB (ref 31.8–35.4)
MCV: 79.2 fL — AB (ref 80–97)
POC LYMPH PERCENT: 20.6 %L (ref 10–50)
RDW, POC: 16.1 %
WBC: 13 10*3/uL — AB (ref 4.6–10.2)

## 2013-08-27 LAB — POCT GLYCOSYLATED HEMOGLOBIN (HGB A1C): Hemoglobin A1C: 6.1

## 2013-08-27 MED ORDER — AMOXICILLIN 500 MG PO CAPS: 1000.0000 mg | ORAL_CAPSULE | Freq: Two times a day (BID) | ORAL | Status: AC

## 2013-08-27 NOTE — Progress Notes (Signed)
Subjective:    [name], female    DOB: [birth year], 37 y.o.   MRN: [id number] This chart was scribed for [name], MD by Valera Castle, ED Scribe. This patient was seen in room 10 and the patient's care was started at 2:31 PM.   HPI HPI Comments: [name] is a 37 y.o. female who presents toumfc complaining of sinus pressure and nasal congestion, onset a few days ago. She reports that on Sunday her ears started popping. She reports an associated headache. She states the nasal congestion is green phlegm. She reports having some nasal spray left over and using that for the symptoms, with some relief. She states she is out of her other medications, and is requesting a Rx refill.   She also reports bilateral knee, ankle, and arm pain, currently being evaluated Dr. Buzzy Han pending. She denies fever, and any other associated symptoms.  She is also requesting a flu vaccination.  Patient Active Problem List   Diagnosis Date Noted  . Insomnia 01/04/2013  . Polycystic ovaries 03/25/2012  . Other abnormal glucose 03/25/2012  . BMI 37.0-37.9,adult 03/25/2012  . Cushing's syndrome-iatrogenic 03/25/2012  . 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 Hyperprolactinemia 03/25/2012  . 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 Allergic rhinitis due to pollen 03/25/2012  . GERD (gastroesophageal reflux disease) 03/25/2012  . Migraine headache 03/25/2012   Past Medical History  Diagnosis Date  . Migraines   . Migraine   . Alopecia areata totalis   . Polycystic ovary disease   . GERD (gastroesophageal reflux disease)   . Hypertension   . Allergy   . Anxiety   . Alopecia    Past Surgical History  Procedure Laterality Date  . Dilation and curettage of uterus    . Cholecystectomy    . Dnc     Allergies  Allergen Reactions  . Darvocet [Propoxyphene-Acetaminophen]   . Felodipine Er   . Imitrex [Sumatriptan Base]    . Prednisone Other (See Comments)    Severe reactions. Patient was on 60mg  and was put into full cushions disease (face swelling and humped back)  . Imitrex [Sumatriptan Succinate] Palpitations    Chest pain   Prior to Admission medications   Medication Sig Start Date End Date Taking? Authorizing Provider  atenolol (TENORMIN) 25 MG tablet Take 1 tablet (25 mg total) by mouth daily. Takes 1/2 pill 01/04/13  Yes [name], MD  AXERT 12.5 MG tablet TAKE 1 TAB AT ONSET OF H/A,MAY REPEAT IN 2 HRS IF NEEDED. MAX.2 COURSES PER WEEK,4 COURSES PER MONTH 06/04/13  Yes Maurice March, MD  cetirizine (ZYRTEC) 10 MG tablet Take 10 mg by mouth daily.   Yes Historical Provider, MD  eletriptan (RELPAX) 20 MG tablet One tablet by mouth at onset of headache. May repeat in 2 hours if headache persists or recurs. may repeat in 2 hours if necessary   Yes Historical Provider, MD  HYDROcodone-acetaminophen (NORCO/VICODIN) 5-325 MG per tablet Take 1 tablet by mouth every 6 (six) hours as needed. 01/04/13  Yes [name], MD  ipratropium (ATROVENT) 0.06 % nasal spray Place 2 sprays into the nose 3 (three) times daily. 02/06/13  Yes [name], MD  Multiple Vitamin (MULITIVITAMIN WITH MINERALS) TABS Take 1 tablet by mouth daily.   Yes Historical Provider, MD  omeprazole (PRILOSEC) 20 MG capsule Take 20 mg by mouth daily.   Yes Historical Provider, MD  pseudoephedrine-guaifenesin [facility]  D) 60-600 MG per tablet Take 1 tablet by mouth every 12 (twelve) hours.   Yes Historical Provider, MD  albuterol (PROVENTIL HFA;VENTOLIN HFA) 108 (90 BASE) MCG/ACT inhaler Inhale 2 puffs into the lungs every 6 (six) hours as needed for wheezing. 10/15/12 10/15/13  Collene Gobble, MD  beclomethasone (QVAR) 80 MCG/ACT inhaler Inhale 1 puff into the lungs 2 (two) times daily. 01/04/13   [name], MD  Biotin 2.5 MG TABS Take 2.5 mg by mouth daily.    Historical Provider, MD  clonazePAM (KLONOPIN) 0.5 MG  tablet Take 0.5 tablets (0.25 mg total) by mouth at bedtime as needed for anxiety. 01/04/13   [name], MD  ipratropium (ATROVENT) 0.06 % nasal spray Place 2 sprays into the nose 3 (three) times daily. 01/27/12 01/26/13  Ryan M Dunn, [name]  RELPAX 40 MG tablet TAKE 1 TABLET BY MOUTH AT ONSET OF HEADACHE, MAY REPEAT IN 2 HOURS IF NEEDED 05/07/12   Nelva Nay, [name]    Review of Systems  Constitutional: Negative for fever.  HENT: Positive for congestion (nasal) and sinus pressure.        Bilateral ear popping.   Musculoskeletal: Positive for arthralgias (bilateral knees, ankles, elbows. ).  Neurological: Positive for headaches.       Objective:   Physical Exam  Nursing note and vitals reviewed. Constitutional: She is oriented to person, place, and time. She appears well-developed and well-nourished. No distress.  HENT:  Head: Normocephalic and atraumatic.  Right Ear: External ear normal.  Left Ear: External ear normal.  Mouth/Throat: Oropharynx is clear and moist.  Nares boggy with purulent discharge. Tender maxillary areas to percussion  Eyes: Conjunctivae and EOM are normal. Pupils are equal, round, and reactive to light.  Neck: Neck supple. No thyromegaly present.  Cardiovascular: Normal rate, regular rhythm and normal heart sounds.   Pulmonary/Chest: Effort normal and breath sounds normal.  Musculoskeletal: Normal range of motion.  Lymphadenopathy:    She has no cervical adenopathy.  Neurological: She is alert and oriented to person, place, and time. No cranial nerve deficit.  Skin: Skin is warm and dry. No rash noted.  Psychiatric: She has a normal mood and affect. Her behavior is normal.     Filed Vitals:   08/27/13 1301  BP: 110/70  Pulse: 82  Temp: 97.8 F (36.6 C)  TempSrc: Oral  Resp: 16  Height: 5\' 2"  (1.575 m)  Weight: 217 lb (98.431 kg)  SpO2: 97%   labs for Dr.Deveschwar ordered Results for orders placed in visit on 08/27/13  B. BURGDORFI  ANTIBODIES      Result Value Range   B burgdorferi Ab IgG+IgM 0.31    ROCKY MTN SPOTTED FVR AB, IGG-BLOOD      Result Value Range   RMSF IgG      PATHOLOGIST SMEAR REVIEW      Result Value Range   Path Review      POCT CBC      Result Value Range   WBC 13.0 (*) 4.6 - 10.2 K/uL   Lymph, poc 2.7  0.6 - 3.4   POC LYMPH PERCENT 20.6  10 - 50 %L   MID (cbc) 0.5  0 - 0.9   POC MID % 4.1  0 - 12 %M   POC Granulocyte 9.8 (*) 2 - 6.9   Granulocyte percent 75.3  37 - 80 %G   RBC 4.93  4.04 - 5.48 M/uL   Hemoglobin 12.1 (*) 12.2 -  16.2 g/dL   HCT, POC 91.4  78.2 - 47.9 %   MCV 79.2 (*) 80 - 97 fL   MCH, POC 24.5 (*) 27 - 31.2 pg   MCHC 31.0 (*) 31.8 - 35.4 g/dL   RDW, POC 95.6     Platelet Count, POC 399  142 - 424 K/uL   MPV 9.9  0 - 99.8 fL  POCT SEDIMENTATION RATE      Result Value Range   POCT SED RATE 47 (*) 0 - 22 mm/hr  POCT GLYCOSYLATED HEMOGLOBIN (HGB A1C)      Result Value Range   Hemoglobin A1C 6.1          Assessment & Plan:  Acute sinusitis, unspecified  Need for prophylactic vaccination and inoculation against influenza - Plan: Flu Vaccine QUAD 36+ mos IM  Arthralgia - Plan: POCT CBC, POCT SEDIMENTATION RATE, B. Burgdorfi Antibodies, Rocky mtn spotted fvr ab, IgG-blood, Pathologist smear review  Bilateral knee pain - Plan: POCT CBC, POCT SEDIMENTATION RATE, B. Burgdorfi Antibodies, Pathologist smear review  Hyperglycemia - Plan: POCT glycosylated hemoglobin (Hb A1C)  Meds ordered this encounter  Medications  . amoxicillin (AMOXIL) 500 MG capsule    Sig: Take 2 capsules (1,000 mg total) by mouth 2 (two) times daily.    Dispense:  40 capsule    Refill:  0       I have completed the patient encounter in its entirety as documented by the scribe, with editing by me where necessary. Robert P. Merla Riches, M.D.

## 2013-08-28 LAB — PATHOLOGIST SMEAR REVIEW

## 2013-08-29 NOTE — Progress Notes (Signed)
Fax ov to Dr Joan Mayans Deveschwar at new location([facility])

## 2013-09-03 ENCOUNTER — Encounter: Payer: Self-pay | Admitting: Internal Medicine

## 2013-09-04 ENCOUNTER — Telehealth: Payer: Self-pay

## 2013-09-04 DIAGNOSIS — G43909 Migraine, unspecified, not intractable, without status migrainosus: Secondary | ICD-10-CM

## 2013-09-04 MED ORDER — HYDROCODONE-ACETAMINOPHEN 5-325 MG PO TABS: 1.0000 | ORAL_TABLET | Freq: Four times a day (QID) | ORAL | Status: DC | PRN

## 2013-09-04 MED ORDER — ELETRIPTAN HYDROBROMIDE 40 MG PO TABS: ORAL_TABLET | ORAL | Status: DC

## 2013-09-04 NOTE — Telephone Encounter (Signed)
Meds ordered this encounter  Medications  . eletriptan (RELPAX) 40 MG tablet    Sig: TAKE 1 TABLET BY MOUTH AT ONSET OF HEADACHE, MAY REPEAT IN 2 HOURS IF NEEDED    Dispense:  8 tablet    Refill:  5  . HYDROcodone-acetaminophen (NORCO/VICODIN) 5-325 MG per tablet    Sig: Take 1 tablet by mouth every 6 (six) hours as needed.    Dispense:  30 tablet    Refill:  0

## 2013-09-04 NOTE — Telephone Encounter (Signed)
Pt called and reported that when she was in to see Dr Merla Riches last week, they discussed RFing her medications but then didn't pursue getting done at OV. Dr Merla Riches only sent in the Abx for her. Pt needs RF of her Relpax, which I am sending in for her, and her vicodin. Dr Merla Riches, do you want to RF? I will pend it for 1 RF for your review.

## 2013-09-05 ENCOUNTER — Telehealth: Payer: Self-pay | Admitting: Oncology

## 2013-09-05 NOTE — Telephone Encounter (Signed)
S/W PT AND GVE NP APPT 12/05 @ 10:30 W/DR. SHADAD REFERRING NAITIK PANWALA, PA DX- INC'D SED RATE/INC'D WBC WELCOME PACKET MAILED.

## 2013-09-05 NOTE — Telephone Encounter (Signed)
Rx in drawer, pt notified on VM

## 2013-09-06 ENCOUNTER — Telehealth: Payer: Self-pay | Admitting: Oncology

## 2013-09-06 NOTE — Telephone Encounter (Signed)
C/D 09/06/13 for appt. 09/27/13

## 2013-09-20 ENCOUNTER — Other Ambulatory Visit: Payer: Self-pay | Admitting: Oncology

## 2013-09-20 DIAGNOSIS — D72829 Elevated white blood cell count, unspecified: Secondary | ICD-10-CM

## 2013-09-27 ENCOUNTER — Encounter: Payer: Self-pay | Admitting: Oncology

## 2013-09-27 ENCOUNTER — Ambulatory Visit (HOSPITAL_BASED_OUTPATIENT_CLINIC_OR_DEPARTMENT_OTHER): Payer: BC Managed Care – PPO

## 2013-09-27 ENCOUNTER — Other Ambulatory Visit (HOSPITAL_BASED_OUTPATIENT_CLINIC_OR_DEPARTMENT_OTHER): Payer: BC Managed Care – PPO | Admitting: Lab

## 2013-09-27 ENCOUNTER — Encounter (INDEPENDENT_AMBULATORY_CARE_PROVIDER_SITE_OTHER): Payer: Self-pay

## 2013-09-27 ENCOUNTER — Ambulatory Visit (HOSPITAL_BASED_OUTPATIENT_CLINIC_OR_DEPARTMENT_OTHER): Payer: BC Managed Care – PPO | Admitting: Oncology

## 2013-09-27 ENCOUNTER — Telehealth: Payer: Self-pay | Admitting: Oncology

## 2013-09-27 VITALS — BP 135/89 | HR 83 | Temp 98.3°F | Resp 20 | Ht 62.0 in | Wt 217.2 lb

## 2013-09-27 DIAGNOSIS — D72829 Elevated white blood cell count, unspecified: Secondary | ICD-10-CM

## 2013-09-27 DIAGNOSIS — R718 Other abnormality of red blood cells: Secondary | ICD-10-CM

## 2013-09-27 LAB — CBC WITH DIFFERENTIAL/PLATELET
Basophils Absolute: 0.1 10*3/uL (ref 0.0–0.1)
EOS%: 3.1 % (ref 0.0–7.0)
Eosinophils Absolute: 0.4 10*3/uL (ref 0.0–0.5)
HGB: 12.8 g/dL (ref 11.6–15.9)
MCH: 24.4 pg — ABNORMAL LOW (ref 25.1–34.0)
MONO#: 0.7 10*3/uL (ref 0.1–0.9)
NEUT#: 8.3 10*3/uL — ABNORMAL HIGH (ref 1.5–6.5)
RDW: 14.9 % — ABNORMAL HIGH (ref 11.2–14.5)
WBC: 13.6 10*3/uL — ABNORMAL HIGH (ref 3.9–10.3)
lymph#: 4.1 10*3/uL — ABNORMAL HIGH (ref 0.9–3.3)

## 2013-09-27 LAB — COMPREHENSIVE METABOLIC PANEL (CC13)
ALT: 21 U/L (ref 0–55)
AST: 19 U/L (ref 5–34)
Albumin: 4.2 g/dL (ref 3.5–5.0)
BUN: 11.8 mg/dL (ref 7.0–26.0)
CO2: 25 mEq/L (ref 22–29)
Calcium: 9.9 mg/dL (ref 8.4–10.4)
Chloride: 104 mEq/L (ref 98–109)
Potassium: 3.9 mEq/L (ref 3.5–5.1)

## 2013-09-27 NOTE — Telephone Encounter (Signed)
appts made per 12/5 POF AVS and CAL mailed shh

## 2013-09-27 NOTE — Progress Notes (Signed)
Checked in new patient with no financial issues and has appt card. °

## 2013-09-27 NOTE — Progress Notes (Signed)
Please see consult note.  

## 2013-09-27 NOTE — Consult Note (Signed)
Reason for Referral: Leukocytosis.   HPI: This is a pleasant 37 year old woman with a past medical history significant for alopecia as well as polycystic ovary disease. She reports that she was in her normal state of health still she turned 34 about 3 years ago and developed a multitude of issues. She reports she developed alopecia as well as chronic joint swelling and pain. She was hospitalized earlier this year in January of 2014 for a respiratory infection. She been evaluated by rheumatology and her workup revealed a mild leukocytosis with a white cell count of 10.6 and an elevated sedimentation rate. I was asked to comments on these findings and to rule out a hematological disorder. Clinically, she does report symptoms of fatigue and tiredness. She does report joint swelling  and pain. She also reports physical and psychological stress associated with her life and job. She has not reported any fevers or chills or sweats. She has not reported any chest pain or shortness of breath or difficulty breathing. She does not report any cough or hemoptysis or hematemesis. She does report chronic sinus problems and sinusitis at times. She does not report any nausea or vomiting or abdominal pain. She has not reported any hematochezia or melena. Is that report any genitourinary complaints. She does reports menstrual cycles intermittently associated with her PCOS. She has not reported any thrombosis or bleeding history. She has not reported any heat or cold intolerance or any recent hospitalizations since January of 2014.   Past Medical History  Diagnosis Date  . Migraines   . Migraine   . Alopecia areata totalis   . Polycystic ovary disease   . GERD (gastroesophageal reflux disease)   . Hypertension   . Allergy   . Anxiety   . Alopecia   :  Past Surgical History  Procedure Laterality Date  . Dilation and curettage of uterus    . Cholecystectomy    . Dnc    :  Current Outpatient Prescriptions   Medication Sig Dispense Refill  . albuterol (PROVENTIL HFA;VENTOLIN HFA) 108 (90 BASE) MCG/ACT inhaler Inhale 2 puffs into the lungs every 6 (six) hours as needed for wheezing.  1 Inhaler  2  . atenolol (TENORMIN) 25 MG tablet Take 1 tablet (25 mg total) by mouth daily. Takes 1/2 pill  30 tablet  5  . AXERT 12.5 MG tablet TAKE 1 TAB AT ONSET OF H/A,MAY REPEAT IN 2 HRS IF NEEDED. MAX.2 COURSES PER WEEK,4 COURSES PER MONTH  8 tablet  0  . beclomethasone (QVAR) 80 MCG/ACT inhaler Inhale 1 puff into the lungs 2 (two) times daily.  1 Inhaler  12  . Biotin 2.5 MG TABS Take 2.5 mg by mouth daily.      . cetirizine (ZYRTEC) 10 MG tablet Take 10 mg by mouth daily.      . clonazePAM (KLONOPIN) 0.5 MG tablet Take 0.5 tablets (0.25 mg total) by mouth at bedtime as needed for anxiety.  30 tablet  5  . eletriptan (RELPAX) 20 MG tablet One tablet by mouth at onset of headache. May repeat in 2 hours if headache persists or recurs. may repeat in 2 hours if necessary      . eletriptan (RELPAX) 40 MG tablet TAKE 1 TABLET BY MOUTH AT ONSET OF HEADACHE, MAY REPEAT IN 2 HOURS IF NEEDED  8 tablet  5  . HYDROcodone-acetaminophen (NORCO/VICODIN) 5-325 MG per tablet Take 1 tablet by mouth every 6 (six) hours as needed.  30 tablet  0  .  ipratropium (ATROVENT) 0.06 % nasal spray Place 2 sprays into the nose 3 (three) times daily.  45 mL  1  . Melatonin 1 MG CAPS Take 1 capsule by mouth at bedtime as needed.      . Multiple Vitamin (MULITIVITAMIN WITH MINERALS) TABS Take 1 tablet by mouth daily.      Marland Kitchen omeprazole (PRILOSEC) 20 MG capsule Take 20 mg by mouth daily.      . pseudoephedrine-guaifenesin (MUCINEX D) 60-600 MG per tablet Take 1 tablet by mouth every 12 (twelve) hours.      Marland Kitchen ipratropium (ATROVENT) 0.06 % nasal spray Place 2 sprays into the nose 3 (three) times daily.  15 mL  2   No current facility-administered medications for this visit.       Allergies  Allergen Reactions  . Darvocet  [Propoxyphene-Acetaminophen]   . Felodipine Er   . Imitrex [Sumatriptan Base]   . Prednisone Other (See Comments)    Severe reactions. Patient was on 60mg  and was put into full cushions disease (face swelling and humped back)  . Imitrex [Sumatriptan Succinate] Palpitations    Chest pain  :  Family History  Problem Relation Age of Onset  . Hypertension Mother   . Hyperlipidemia Mother   . Hyperlipidemia Maternal Grandmother   . Hypertension Maternal Grandmother   :  History   Social History  . Marital Status: Married    Spouse Name: N/A    Number of Children: N/A  . Years of Education: N/A   Occupational History  . Not on file.   Social History Main Topics  . Smoking status: Never Smoker   . Smokeless tobacco: Not on file  . Alcohol Use: Yes     Comment: occasional  . Drug Use: No  . Sexual Activity: Yes   Other Topics Concern  . Not on file   Social History Narrative   ** Merged History Encounter **      :  Pertinent items are noted in HPI.  Exam: ECOG 1 Blood pressure 135/89, pulse 83, temperature 98.3 F (36.8 C), temperature source Oral, resp. rate 20, height 5\' 2"  (1.575 m), weight 217 lb 3.2 oz (98.521 kg), SpO2 98.00%. General appearance: alert, cooperative and appears stated age Head: Normocephalic, without obvious abnormality, atraumatic Nose: Nares normal. Septum midline. Mucosa normal. No drainage or sinus tenderness. Throat: lips, mucosa, and tongue normal; teeth and gums normal Neck: no adenopathy, no carotid bruit, no JVD, supple, symmetrical, trachea midline and thyroid not enlarged, symmetric, no tenderness/mass/nodules Back: negative Resp: clear to auscultation bilaterally Cardio: regular rate and rhythm, S1, S2 normal, no murmur, click, rub or gallop GI: soft, non-tender; bowel sounds normal; no masses,  no organomegaly Extremities: extremities normal, atraumatic, no cyanosis or edema Pulses: 2+ and symmetric   Recent Labs   09/27/13 1028  WBC 13.6*  HGB 12.8  HCT 39.5  PLT 428*    Recent Labs  09/27/13 1028  NA 140  K 3.9  CO2 25  GLUCOSE 159*  BUN 11.8  CREATININE 0.8  CALCIUM 9.9     Blood smear review: Leukocytosis and neutrophilia was noted slight the microcytosis and increased platelets noted.    Assessment and Plan:   36 year old woman with the following issues:  1. Leukocytosis an elevated sedimentation rate: Her differential is perfectly normal and her white cell count from 13.6 which has not dramatically changed from 13.0 about a month ago. Looking at the previous blood counts in the last 2 years  showed that she had fluctuating leukocytosis as high as 13.6 and is normal as 10.6. Differential diagnosis was discussed today with the patient and her husband. Most likely we're dealing with a reactive leukocytosis due to chronic inflammatory conditions. I think that there would be elevated sedimentation rate is most likely a baseline of chronic inflammatory conditions rather than a blood disorder. I doubt that there is anything to suggest a myeloproliferative disorder or a malignancy. I see no need for extensive hematological workup or bone marrow biopsy. I think continuous observation would be where it did like to repeat her blood count in about 3 months.  I recommended that she continue to follow with her rheumatologist as I see no clear-cut evidence of a hematological disorder.  2. Microcytosis and elevated RDW. This is most likely related to borderline iron deficiency which we can certainly continue to monitor and his iron supplementation if needed to.  All her questions were answered today and I will see her back again in 3 months

## 2013-10-29 ENCOUNTER — Ambulatory Visit (INDEPENDENT_AMBULATORY_CARE_PROVIDER_SITE_OTHER): Payer: BC Managed Care – PPO | Admitting: Internal Medicine

## 2013-10-29 VITALS — BP 138/88 | HR 110 | Temp 99.3°F | Resp 16 | Ht 63.0 in | Wt 218.0 lb

## 2013-10-29 DIAGNOSIS — J029 Acute pharyngitis, unspecified: Secondary | ICD-10-CM

## 2013-10-29 DIAGNOSIS — G43909 Migraine, unspecified, not intractable, without status migrainosus: Secondary | ICD-10-CM

## 2013-10-29 DIAGNOSIS — G4700 Insomnia, unspecified: Secondary | ICD-10-CM

## 2013-10-29 LAB — POCT RAPID STREP A (OFFICE): Rapid Strep A Screen: NEGATIVE

## 2013-10-29 MED ORDER — AMOXICILLIN 500 MG PO CAPS: 1000.0000 mg | ORAL_CAPSULE | Freq: Two times a day (BID) | ORAL | Status: AC

## 2013-10-29 MED ORDER — HYDROCODONE-ACETAMINOPHEN 5-325 MG PO TABS: 1.0000 | ORAL_TABLET | Freq: Four times a day (QID) | ORAL | Status: DC | PRN

## 2013-10-29 MED ORDER — CLONAZEPAM 0.5 MG PO TABS: 0.2500 mg | ORAL_TABLET | Freq: Two times a day (BID) | ORAL | Status: DC | PRN

## 2013-10-29 MED ORDER — CYCLOBENZAPRINE HCL 10 MG PO TABS: 10.0000 mg | ORAL_TABLET | Freq: Every day | ORAL | Status: DC

## 2013-10-29 NOTE — Progress Notes (Signed)
Subjective:    [name], female    DOB: September 24, 1976, 38 y.o.   MRN: [id number]  HPI This chart was scribed for [employer] by Smiley Houseman, Scribe. This patient was seen in room 14 and the patient's care was started at 7:30 PM.  HPI Comments: [name] is a 38 y.o. female who presents to the [facility] complaining of flu-like symptoms that started about 3 days ago.  She has an associated cough, subjective fever, sore throat, otalgia, and rhinorrhea.  Temperature now is 99.68F.  She states the cough is keeping her up at night.  She states it is extremely painful when she swallows.  Pt states she did receive a flu shot this year.  Pt states she needs a new prescription for Vicodin for her chronic joint pain.  She states she has tried taking aleve, but without relief. Evaluation by multiple specialists now indicates a likely to a mean etiology for her hair loss and multiple joint pains. 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  She states her mother is going through breast cancer adding another stress factor.  These factors may contribute to her loss of sleep.  She states these factors and the lack of sleep is causing her anxiety.      Past Surgical History  Procedure Laterality Date   Dilation and curettage of uterus     Cholecystectomy     Dnc      Family History  Problem Relation Age of Onset   Hypertension Mother    Hyperlipidemia Mother    Hyperlipidemia Maternal Grandmother    Hypertension Maternal Grandmother       Allergies  Allergen Reactions   Darvocet [Propoxyphene N-Acetaminophen]    Felodipine Er    Imitrex [Sumatriptan Base]    Prednisone Other (See Comments)    Severe reactions. 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 Takes 1/2 pill, Disp: 30 tablet, Rfl: 5 AXERT 12.5 MG tablet, TAKE 1 TAB AT ONSET OF H/A,MAY REPEAT IN 2 HRS IF NEEDED. MAX.2 COURSES PER WEEK,4 COURSES PER MONTH, Disp: 8 tablet, Rfl: 0;  beclomethasone (QVAR) 80 MCG/ACT inhaler, Inhale 1 puff into the lungs 2 (two) times daily., Disp: 1 Inhaler, Rfl: 12;  Biotin 2.5 MG TABS, Take 2.5 mg by mouth daily., Disp: , Rfl: ;  cetirizine (ZYRTEC) 10 MG tablet,  Take 10 mg by mouth daily., Disp: , Rfl:  clonazePAM (KLONOPIN) 0.5 MG tablet, Take 0.5 tablets (0.25 mg total) by mouth 2 (two) times daily as needed for anxiety., Disp: 60 tablet, Rfl: 2;  cyclobenzaprine (FLEXERIL) 10 MG tablet, Take 1 tablet (10 mg total) by mouth at bedtime., Disp: 30 tablet, Rfl: 0;  eletriptan (RELPAX) 20 MG tablet, One tablet by mouth at onset of headache. May repeat in 2 hours if headache persists or recurs. may repeat in 2 hours if necessary, Disp: , Rfl:  eletriptan (RELPAX) 40 MG tablet, TAKE 1 TABLET BY  MOUTH AT ONSET OF HEADACHE, MAY REPEAT IN 2 HOURS IF NEEDED, Disp: 8 tablet, Rfl: 5;  HYDROcodone-acetaminophen (NORCO/VICODIN) 5-325 MG per tablet, Take 1 tablet by mouth every 6 (six) hours as needed., Disp: 30 tablet, Rfl: 0;  HYDROcodone-acetaminophen (NORCO/VICODIN) 5-325 MG per tablet, Take 1 tablet by mouth every 6 (six) hours as needed. For 30d from signing date, Disp: 30 tablet, Rfl: 0 ipratropium (ATROVENT) 0.06 % nasal spray, Place 2 sprays into the nose 3 (three) times daily., Disp: 15 mL, Rfl: 2;  ipratropium (ATROVENT) 0.06 % nasal spray, Place 2 sprays into the nose 3 (three) times daily., Disp: 45 mL, Rfl: 1;  Melatonin 1 MG CAPS, Take 1 capsule by mouth at bedtime as needed., Disp: , Rfl: ;  Multiple Vitamin (MULITIVITAMIN WITH MINERALS) TABS, Take 1 tablet by mouth daily., Disp: , Rfl:  omeprazole (PRILOSEC) 20 MG capsule, Take 20 mg by mouth daily., Disp: , Rfl: ;  pseudoephedrine-guaifenesin (MUCINEX D) 60-600 MG per tablet, Take 1 tablet by mouth every 12 (twelve) hours., Disp: , Rfl:    Review of Systems  Constitutional: Positive for fever. Negative for chills.  HENT: Positive for congestion, ear pain and sore throat. Negative for rhinorrhea.   Respiratory: Positive for cough. Negative for shortness of breath.   Cardiovascular: Negative for chest pain.  Gastrointestinal: Negative for nausea, vomiting, abdominal pain and diarrhea.  Musculoskeletal: Negative for back pain.  Skin: Negative for color change and rash.  Neurological: Negative for syncope.       Objective:   Physical Exam  Nursing note and vitals reviewed. Constitutional: She is oriented to person, place, and time. She appears well-developed and well-nourished. No distress.  HENT:  Head: Normocephalic and atraumatic.  Left Ear: External ear normal.  Nares are boggy with purulent.  PND Some erythema.     Eyes: Conjunctivae and EOM are normal. Pupils are equal, round, and reactive to light. Right eye exhibits  no discharge. Left eye exhibits no discharge.  Neck: Normal range of motion. No thyromegaly present.  Pulmonary/Chest: Effort normal. No respiratory distress.  Lymphadenopathy:    She has no cervical adenopathy.  Neurological: She is alert and oriented to person, place, and time.  Skin: Skin is warm and dry.  Psychiatric: She has a normal mood and affect. Her behavior is normal.    Triage Vitals: BP 138/88   Pulse 110   Temp(Src) 99.3 F (37.4 C) (Oral)   Resp 16   Ht 5\' 3"  (1.6 m)   Wt 218 lb (98.884 kg)   BMI 38.63 kg/m2   SpO2 97%  DIAGNOSTIC STUDIES: Oxygen Saturation is 97% on RA, normal by my interpretation.    COORDINATION OF CARE: 7:43 PM-Will prescribe Klonopin, Vicodin, and Flexeril.  Patient informed of current plan of treatment and evaluation and agrees with plan.        Assessment & Plan:  Sore throat -  Plan: POCT rapid strep A, Culture, Group A Strep  Insomnia - Plan: clonazePAM (KLONOPIN) 0.5 MG tablet, Culture, Group A Strep  Headache, migraine - Plan: HYDROcodone-acetaminophen (NORCO/VICODIN) 5-325 MG per tablet, HYDROcodone-acetaminophen (NORCO/VICODIN) 5-325 MG per tablet, Culture, Group A Strep    Meds ordered this encounter  Medications   clonazePAM (KLONOPIN) 0.5 MG tablet    Sig: Take 0.5 tablets (0.25 mg total) by mouth 2 (two) times daily as needed for anxiety.    Dispense:  60 tablet    Refill:  2   HYDROcodone-acetaminophen (NORCO/VICODIN) 5-325 MG per tablet    Sig: Take 1 tablet by mouth every 6 (six) hours as needed.    Dispense:  30 tablet    Refill:  0   cyclobenzaprine (FLEXERIL) 10 MG tablet    Sig: Take 1 tablet (10 mg total) by mouth at bedtime.    Dispense:  30 tablet    Refill:  0   HYDROcodone-acetaminophen (NORCO/VICODIN) 5-325 MG per tablet    Sig: Take 1 tablet by mouth every 6 (six) hours as needed. For 30d from signing date    Dispense:  30 tablet    Refill:  0     I have completed the patient encounter in its entirety  as documented by the scribe, with editing by me where necessary. Robert P. Merla Riches, M.D.

## 2013-11-01 LAB — CULTURE, GROUP A STREP: ORGANISM ID, BACTERIA: NORMAL

## 2013-11-24 ENCOUNTER — Other Ambulatory Visit: Payer: Self-pay | Admitting: Internal Medicine

## 2013-11-25 NOTE — Telephone Encounter (Signed)
Do you want to give pt RFs?

## 2013-11-25 NOTE — Telephone Encounter (Signed)
Looks like refiil authorized --that's ok Anything else

## 2013-12-31 ENCOUNTER — Telehealth: Payer: Self-pay | Admitting: Oncology

## 2013-12-31 ENCOUNTER — Other Ambulatory Visit (HOSPITAL_BASED_OUTPATIENT_CLINIC_OR_DEPARTMENT_OTHER): Payer: BC Managed Care – PPO

## 2013-12-31 ENCOUNTER — Encounter: Payer: Self-pay | Admitting: Oncology

## 2013-12-31 ENCOUNTER — Ambulatory Visit (HOSPITAL_BASED_OUTPATIENT_CLINIC_OR_DEPARTMENT_OTHER): Payer: BC Managed Care – PPO | Admitting: Oncology

## 2013-12-31 VITALS — BP 136/78 | HR 76 | Temp 98.1°F | Resp 18 | Ht 63.0 in | Wt 218.3 lb

## 2013-12-31 DIAGNOSIS — D72829 Elevated white blood cell count, unspecified: Secondary | ICD-10-CM

## 2013-12-31 DIAGNOSIS — D500 Iron deficiency anemia secondary to blood loss (chronic): Secondary | ICD-10-CM

## 2013-12-31 DIAGNOSIS — D649 Anemia, unspecified: Secondary | ICD-10-CM

## 2013-12-31 DIAGNOSIS — R718 Other abnormality of red blood cells: Secondary | ICD-10-CM

## 2013-12-31 LAB — COMPREHENSIVE METABOLIC PANEL (CC13)
ALBUMIN: 3.7 g/dL (ref 3.5–5.0)
ALT: 24 U/L (ref 0–55)
ANION GAP: 11 meq/L (ref 3–11)
AST: 22 U/L (ref 5–34)
Alkaline Phosphatase: 85 U/L (ref 40–150)
BUN: 8.2 mg/dL (ref 7.0–26.0)
CALCIUM: 9.2 mg/dL (ref 8.4–10.4)
CHLORIDE: 107 meq/L (ref 98–109)
CO2: 23 meq/L (ref 22–29)
CREATININE: 0.8 mg/dL (ref 0.6–1.1)
Glucose: 164 mg/dl — ABNORMAL HIGH (ref 70–140)
POTASSIUM: 3.8 meq/L (ref 3.5–5.1)
SODIUM: 141 meq/L (ref 136–145)
TOTAL PROTEIN: 7 g/dL (ref 6.4–8.3)
Total Bilirubin: 0.6 mg/dL (ref 0.20–1.20)

## 2013-12-31 LAB — CBC WITH DIFFERENTIAL/PLATELET
BASO%: 1 % (ref 0.0–2.0)
Basophils Absolute: 0.1 10*3/uL (ref 0.0–0.1)
EOS%: 3.4 % (ref 0.0–7.0)
Eosinophils Absolute: 0.4 10*3/uL (ref 0.0–0.5)
HEMATOCRIT: 34.8 % (ref 34.8–46.6)
HGB: 11.1 g/dL — ABNORMAL LOW (ref 11.6–15.9)
LYMPH#: 3 10*3/uL (ref 0.9–3.3)
LYMPH%: 28.1 % (ref 14.0–49.7)
MCH: 23.3 pg — AB (ref 25.1–34.0)
MCHC: 31.8 g/dL (ref 31.5–36.0)
MCV: 73.4 fL — ABNORMAL LOW (ref 79.5–101.0)
MONO#: 0.6 10*3/uL (ref 0.1–0.9)
MONO%: 5.4 % (ref 0.0–14.0)
NEUT#: 6.6 10*3/uL — ABNORMAL HIGH (ref 1.5–6.5)
NEUT%: 62.1 % (ref 38.4–76.8)
Platelets: 374 10*3/uL (ref 145–400)
RBC: 4.74 10*6/uL (ref 3.70–5.45)
RDW: 14.9 % — AB (ref 11.2–14.5)
WBC: 10.5 10*3/uL — AB (ref 3.9–10.3)

## 2013-12-31 NOTE — Progress Notes (Signed)
Hematology and Oncology Follow Up Visit  [name] [id number] Jun 30, 1976 38 y.o. 12/31/2013 12:22 PM DOOLITTLE, Harrel Lemon, MDDoolittle, Harrel Lemon, MD   Principle Diagnosis: 38 year old woman with leukocytosis likely reactive as well as iron deficiency anemia. This was diagnosed in December of 2014.  Current therapy: oral iron replacement to start soon.  Interim History:  This is a pleasant 38 year old woman him seen in consultation in followup after her initial evaluation in December of 2014. She was evaluated for mild leukocytosis and microcytosis. Her workup did not reveal any findings to suggest any malignancy. Her leukocytosis likely reactive but she does have microcytosis unlikely iron deficiency anemia. Since her last visit, she was started on a muscle relaxant which have helped her symptoms and was able to sleep better. She feels less stressed and more likely less inflammatory response in her body. She has not had any hospitalization or illnesses. Has not reported any constitutional symptoms weight loss or appetite changes.  Medications: I have reviewed the patient's current medications.  Current Outpatient Prescriptions  Medication Sig Dispense Refill  . atenolol (TENORMIN) 25 MG tablet Take 1 tablet (25 mg total) by mouth daily. Takes 1/2 pill  30 tablet  5  . AXERT 12.5 MG tablet TAKE 1 TAB AT ONSET OF H/A,MAY REPEAT IN 2 HRS IF NEEDED. MAX.2 COURSES PER WEEK,4 COURSES PER MONTH  8 tablet  0  . beclomethasone (QVAR) 80 MCG/ACT inhaler Inhale 1 puff into the lungs 2 (two) times daily.  1 Inhaler  12  . Biotin 2.5 MG TABS Take 2.5 mg by mouth daily.      . cetirizine (ZYRTEC) 10 MG tablet Take 10 mg by mouth daily.      . clonazePAM (KLONOPIN) 0.5 MG tablet Take 0.5 tablets (0.25 mg total) by mouth 2 (two) times daily as needed for anxiety.  60 tablet  2  . cyclobenzaprine (FLEXERIL) 10 MG tablet TAKE 1 TABLET (10 MG TOTAL) BY MOUTH AT BEDTIME.  30 tablet  4  . eletriptan  (RELPAX) 20 MG tablet One tablet by mouth at onset of headache. May repeat in 2 hours if headache persists or recurs. may repeat in 2 hours if necessary      . eletriptan (RELPAX) 40 MG tablet TAKE 1 TABLET BY MOUTH AT ONSET OF HEADACHE, MAY REPEAT IN 2 HOURS IF NEEDED  8 tablet  5  . HYDROcodone-acetaminophen (NORCO/VICODIN) 5-325 MG per tablet Take 1 tablet by mouth every 6 (six) hours as needed. For 30d from signing date  30 tablet  0  . ipratropium (ATROVENT) 0.06 % nasal spray Place 2 sprays into the nose 3 (three) times daily.  45 mL  1  . Melatonin 1 MG CAPS Take 1 capsule by mouth at bedtime as needed.      . Multiple Vitamin (MULITIVITAMIN WITH MINERALS) TABS Take 1 tablet by mouth daily.      Marland Kitchen omeprazole (PRILOSEC) 20 MG capsule Take 20 mg by mouth daily.      . pseudoephedrine-guaifenesin (MUCINEX D) 60-600 MG per tablet Take 1 tablet by mouth every 12 (twelve) hours.      Marland Kitchen albuterol (PROVENTIL HFA;VENTOLIN HFA) 108 (90 BASE) MCG/ACT inhaler Inhale 2 puffs into the lungs every 6 (six) hours as needed for wheezing.  1 Inhaler  2  . ipratropium (ATROVENT) 0.06 % nasal spray Place 2 sprays into the nose 3 (three) times daily.  15 mL  2   No current facility-administered medications for this visit.  Allergies:  Allergies  Allergen Reactions  . Darvocet [Propoxyphene N-Acetaminophen]   . Felodipine Er   . Imitrex [Sumatriptan Base]   . Prednisone Other (See Comments)    Severe reactions. Patient was on 60mg  and was put into full cushions disease (face swelling and humped back)  . Imitrex [Sumatriptan Succinate] Palpitations    Chest pain    Past Medical History, Surgical history, Social history, and Family History were reviewed and updated.  Review of Systems: Constitutional:  Negative for fever, chills, night sweats, anorexia, weight loss, pain. Cardiovascular: no chest pain or dyspnea on exertion Respiratory: no cough, shortness of breath, or wheezing Neurological: no  TIA or stroke symptoms Dermatological: negative ENT: negative Skin: Negative. Gastrointestinal: no abdominal pain, change in bowel habits, or black or bloody stools Genito-Urinary: no dysuria, trouble voiding, or hematuria Hematological and Lymphatic: negative Breast: negative Musculoskeletal: negative Remaining ROS negative. Physical Exam: Blood pressure 136/78, pulse 76, temperature 98.1 F (36.7 C), temperature source Oral, resp. rate 18, height 5\' 3"  (1.6 m), weight 218 lb 4.8 oz (99.02 kg), SpO2 100.00%. ECOG: 0 General appearance: alert, cooperative and appears stated age Head: Normocephalic, without obvious abnormality, atraumatic Neck: no adenopathy, no carotid bruit, no JVD, supple, symmetrical, trachea midline and thyroid not enlarged, symmetric, no tenderness/mass/nodules Lymph nodes: Cervical, supraclavicular, and axillary nodes normal. Heart:regular rate and rhythm, S1, S2 normal, no murmur, click, rub or gallop Lung:chest clear, no wheezing, rales, normal symmetric air entry Abdomin: soft, non-tender, without masses or organomegaly EXT:no erythema, induration, or nodules   Lab Results: Lab Results  Component Value Date   WBC 10.5* 12/31/2013   HGB 11.1* 12/31/2013   HCT 34.8 12/31/2013   MCV 73.4* 12/31/2013   PLT 374 12/31/2013     Chemistry      Component Value Date/Time   NA 140 09/27/2013 1028   NA 138 11/10/2012 1517   K 3.9 09/27/2013 1028   K 4.5 11/10/2012 1517   CL 103 11/10/2012 1517   CO2 25 09/27/2013 1028   CO2 24 11/10/2012 1517   BUN 11.8 09/27/2013 1028   BUN 7 11/10/2012 1517   CREATININE 0.8 09/27/2013 1028   CREATININE 0.75 11/10/2012 1517   CREATININE 0.62 11/08/2010 1143      Component Value Date/Time   CALCIUM 9.9 09/27/2013 1028   CALCIUM 9.6 11/10/2012 1517   ALKPHOS 105 09/27/2013 1028   ALKPHOS 105 11/10/2012 1517   AST 19 09/27/2013 1028   AST 49* 11/10/2012 1517   ALT 21 09/27/2013 1028   ALT 63* 11/10/2012 1517   BILITOT 0.76 09/27/2013 1028    BILITOT 0.6 11/10/2012 1517          Impression and Plan:   38 year old woman with the following issues:  1. Leukocytosis with a normal differential: This is most likely reactive or peripheral smear and differentials perfectly normal and her white cell count have nearly normalized. The differential diagnosis includes lymphoproliferative disorders but these conditions are extremely unlikely at this point. I see no need for any extensive workup at this time but we'll continue to monitor.  2. Microcytosis with mild anemia: This is likely related to iron deficiency I've instructed her to take oral iron daily with vitamin C and I will recheck her iron stores in about 3 months. I've also discuss with her lateral IV iron as a potential modality treatment.   [facility], MD 3/10/201512:22 PM

## 2013-12-31 NOTE — Telephone Encounter (Signed)
gv adn printed appt sched for pt for June....pt needed monday or tuesday appt.

## 2014-01-01 ENCOUNTER — Ambulatory Visit (INDEPENDENT_AMBULATORY_CARE_PROVIDER_SITE_OTHER): Payer: BC Managed Care – PPO | Admitting: Internal Medicine

## 2014-01-01 ENCOUNTER — Encounter: Payer: Self-pay | Admitting: Internal Medicine

## 2014-01-01 VITALS — BP 130/89 | HR 93 | Temp 98.4°F | Resp 16 | Ht 63.0 in | Wt 217.8 lb

## 2014-01-01 DIAGNOSIS — D649 Anemia, unspecified: Secondary | ICD-10-CM

## 2014-01-01 DIAGNOSIS — G4700 Insomnia, unspecified: Secondary | ICD-10-CM

## 2014-01-01 DIAGNOSIS — G43909 Migraine, unspecified, not intractable, without status migrainosus: Secondary | ICD-10-CM

## 2014-01-01 MED ORDER — ONDANSETRON HCL 4 MG PO TABS: 4.0000 mg | ORAL_TABLET | Freq: Three times a day (TID) | ORAL | Status: DC | PRN

## 2014-01-01 MED ORDER — IRON (FERROUS GLUCONATE) 256 (28 FE) MG PO TABS: 1.0000 | ORAL_TABLET | Freq: Two times a day (BID) | ORAL | Status: DC

## 2014-01-01 MED ORDER — CLONAZEPAM 0.5 MG PO TABS: 0.2500 mg | ORAL_TABLET | Freq: Two times a day (BID) | ORAL | Status: DC | PRN

## 2014-01-01 MED ORDER — HYDROCODONE-ACETAMINOPHEN 5-325 MG PO TABS: 1.0000 | ORAL_TABLET | Freq: Four times a day (QID) | ORAL | Status: DC | PRN

## 2014-01-01 MED ORDER — FE FUM-VIT C-VIT B12-FA 460-60-0.01-1 MG PO CAPS: 1.0000 | ORAL_CAPSULE | Freq: Every day | ORAL | Status: DC

## 2014-01-01 NOTE — Progress Notes (Signed)
This chart was scribed for [name], MD by Joaquin Music, ED Scribe. This patient was seen in room Room/bed 24 and the patient's care was started at 12:36 PM. Subjective:    [name], female    DOB: 1976/08/05, 38 y.o.   MRN: [id number] Chief Complaint  Patient presents with  . Follow-up    flexeril, paperwork for fmla,  discuss iron   HPI [name] is a 38 y.o. female who presents to the [facility] for F/U apt. Pt was seen by Dr. Clelia Croft due to concern for pts anemia and requested lab work to be done on patient. Pt states she was taking an Iron supplement without a prescription with orange juice and states "she did not tolerate it too well".  Pt will have a F/U apt with Dr. Clelia Croft in 3 months and is hoping to be released at this time. Pt states she has begun taking probiotics.   Pt states she has been able to sleep well due to being on Flexeril. Pt denies having any complications with Flexeril since prescribed.  Pt states Dr. Richelle Ito has requested a full bone scan to be done. She states she was informed no rheumatological abnormalities were found during her last scan to her hands. Pt had a doppler done to bilateral feet.    Pt states she has menstrual cycles every 4 months. She reports taking a certain drug that causes the 74-months bleeding. Pt states she is more stressed at this time compared to the last 2-years. She states she is change her [employer] cart at this time. She states this is her first time she hates going to work due to hating how the system operates. Pt reports working 6 consecutive days with 12 hour shifts last week while having her mother face chemotherapy.  Pt has requested her FMLA paperwork to be filled out.   Pt states her "HA have slightly worsened".  Pt has also requested a refill of her Vicodin and Klonopin. Pt states the 30 mg of Vicodin generally last her a month. She denies refill with Albuterol, Qvar,  Axel, Zyrtec, and Atrovent Nasal spray.  Current outpatient prescriptions:atenolol (TENORMIN) 25 MG tablet, Take 1 tablet (25 mg total) by mouth daily. Takes 1/2 pill, Disp: 30 tablet, Rfl: 5;  AXERT 12.5 MG tablet, TAKE 1 TAB AT ONSET OF H/A,MAY REPEAT IN 2 HRS IF NEEDED. MAX.2 COURSES PER WEEK,4 COURSES PER MONTH, Disp: 8 tablet, Rfl: 0;  beclomethasone (QVAR) 80 MCG/ACT inhaler, Inhale 1 puff into the lungs 2 (two) times daily., Disp: 1 Inhaler, Rfl: 12 Biotin 2.5 MG TABS, Take 2.5 mg by mouth daily., Disp: , Rfl: ;  cetirizine (ZYRTEC) 10 MG tablet, Take 10 mg by mouth daily., Disp: , Rfl: ;  clonazePAM (KLONOPIN) 0.5 MG tablet, Take 0.5 tablets (0.25 mg total) by mouth 2 (two) times daily as needed for anxiety., Disp: 60 tablet, Rfl: 2;  cyclobenzaprine (FLEXERIL) 10 MG tablet, TAKE 1 TABLET (10 MG TOTAL) BY MOUTH AT BEDTIME., Disp: 30 tablet, Rfl: 4 eletriptan (RELPAX) 20 MG tablet, One tablet by mouth at onset of headache. May repeat in 2 hours if headache persists or recurs. may repeat in 2 hours if necessary, Disp: , Rfl: ;  eletriptan (RELPAX) 40 MG tablet, TAKE 1 TABLET BY MOUTH AT ONSET OF HEADACHE, MAY REPEAT IN 2 HOURS IF NEEDED, Disp: 8 tablet, Rfl: 5 HYDROcodone-acetaminophen (NORCO/VICODIN) 5-325 MG per tablet, Take 1 tablet by mouth every 6 (  six) hours as needed. For 30d from signing date, Disp: 30 tablet, Rfl: 0;  ipratropium (ATROVENT) 0.06 % nasal spray, Place 2 sprays into the nose 3 (three) times daily., Disp: 45 mL, Rfl: 1;  Melatonin 1 MG CAPS, Take 1 capsule by mouth at bedtime as needed., Disp: , Rfl:  Multiple Vitamin (MULITIVITAMIN WITH MINERALS) TABS, Take 1 tablet by mouth daily., Disp: , Rfl: ;  omeprazole (PRILOSEC) 20 MG capsule, Take 20 mg by mouth daily., Disp: , Rfl: ;  pseudoephedrine-guaifenesin (MUCINEX D) 60-600 MG per tablet, Take 1 tablet by mouth every 12 (twelve) hours., Disp: , Rfl:  albuterol (PROVENTIL HFA;VENTOLIN HFA) 108 (90 BASE) MCG/ACT inhaler, Inhale 2  puffs into the lungs every 6 (six) hours as needed for wheezing., Disp: 1 Inhaler, Rfl: 2;  Fe Fum-Vit C-Vit B12-FA (TRIGELS-F) 460-60-0.01-1 MG CAPS capsule, Take 1 capsule by mouth daily., Disp: 30 capsule, Rfl: 2;  ipratropium (ATROVENT) 0.06 % nasal spray, Place 2 sprays into the nose 3 (three) times daily., Disp: 15 mL, Rfl: 2 Iron, Ferrous Gluconate, 256 (28 FE) MG TABS, Take 1 capsule by mouth 2 (two) times daily., Disp: 60 tablet, Rfl: 2  Patient Active Problem List   Diagnosis Date Noted  . Insomnia 01/04/2013  . Polycystic ovaries 03/25/2012  . Other abnormal glucose 03/25/2012  . BMI 37.0-37.9,adult 03/25/2012  . Cushing's syndrome-iatrogenic 03/25/2012  . 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 Hyperprolactinemia 03/25/2012  . 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 Allergic rhinitis due to pollen 03/25/2012  . GERD (gastroesophageal reflux disease) 03/25/2012  . Migraine headache 03/25/2012    Review of Systems  Psychiatric/Behavioral: Negative for sleep disturbance.   Objective:   Physical Exam  Nursing note and vitals reviewed. Constitutional: She is oriented to person, place, and time. She appears well-developed and well-nourished. No distress.  HENT:  Head: Normocephalic.  Eyes: EOM are normal. Pupils are equal, round, and reactive to light.  Neck: Neck supple.  Cardiovascular: Normal rate.   Pulmonary/Chest: Effort normal.  Neurological: She is alert and oriented to person, place, and time.  Psychiatric: She has a normal mood and affect. Her behavior is normal. Thought content normal.   Last A1C 08/27/2013 was 6.1.  Results for orders placed in visit on 12/31/13  CBC WITH DIFFERENTIAL      Result Value Ref Range   WBC 10.5 (*) 3.9 - 10.3 10e3/uL   NEUT# 6.6 (*) 1.5 - 6.5 10e3/uL   HGB 11.1 (*) 11.6 - 15.9 g/dL   HCT 95.6  38.7 - 56.4 %   Platelets 374  145 - 400 10e3/uL   MCV 73.4 (*) 79.5 - 101.0 fL   MCH 23.3 (*) 25.1 - 34.0  pg   MCHC 31.8  31.5 - 36.0 g/dL   RBC 3.32  9.51 - 8.84 10e6/uL   RDW 14.9 (*) 11.2 - 14.5 %   lymph# 3.0  0.9 - 3.3 10e3/uL   MONO# 0.6  0.1 - 0.9 10e3/uL   Eosinophils Absolute 0.4  0.0 - 0.5 10e3/uL   Basophils Absolute 0.1  0.0 - 0.1 10e3/uL   NEUT% 62.1  38.4 - 76.8 %   LYMPH% 28.1  14.0 - 49.7 %   MONO% 5.4  0.0 - 14.0 %   EOS% 3.4  0.0 - 7.0 %   BASO% 1.0  0.0 - 2.0 %  COMPREHENSIVE METABOLIC PANEL (CC13)      Result Value Ref Range   Sodium 141  136 - 145  mEq/L   Potassium 3.8  3.5 - 5.1 mEq/L   Chloride 107  98 - 109 mEq/L   CO2 23  22 - 29 mEq/L   Glucose 164 (*) 70 - 140 mg/dl   BUN 8.2  7.0 - 93.2 mg/dL   Creatinine 0.8  0.6 - 1.1 mg/dL   Total Bilirubin 6.71  0.20 - 1.20 mg/dL   Alkaline Phosphatase 85  40 - 150 U/L   AST 22  5 - 34 U/L   ALT 24  0 - 55 U/L   Total Protein 7.0  6.4 - 8.3 g/dL   Albumin 3.7  3.5 - 5.0 g/dL   Calcium 9.2  8.4 - 24.5 mg/dL   Anion Gap 11  3 - 11 mEq/L   Assessment & Plan:   I have completed the patient encounter in its entirety as documented by the scribe, with editing by me where necessary. Narvel Kozub P. Merla Riches, M.D. Insomnia - Plan: clonazePAM (KLONOPIN) 0.5 MG tablet  Headache, migraine - Plan: HYDROcodone-acetaminophen (NORCO/VICODIN) 5-325 MG per tablet//Zofran  Relpax or Axert, FMLA  Anemia--unclear etiology for iron loss/followed by oncology/intolerant to ferrous sulfate so far/  Try ferrous gluconate  Meds ordered this encounter  Medications  . Fe Fum-Vit C-Vit B12-FA (TRIGELS-F) 460-60-0.01-1 MG CAPS capsule    Sig: Take 1 capsule by mouth daily.    Dispense:  30 capsule    Refill:  2  . Iron, Ferrous Gluconate, 256 (28 FE) MG TABS    Sig: Take 1 capsule by mouth 2 (two) times daily.    Dispense:  60 tablet    Refill:  2  . ondansetron (ZOFRAN) 4 MG tablet    Sig: Take 1 tablet (4 mg total) by mouth every 8 (eight) hours as needed for nausea or vomiting.    Dispense:  20 tablet    Refill:  5  . clonazePAM  (KLONOPIN) 0.5 MG tablet    Sig: Take 0.5 tablets (0.25 mg total) by mouth 2 (two) times daily as needed for anxiety.    Dispense:  60 tablet    Refill:  2  . HYDROcodone-acetaminophen (NORCO/VICODIN) 5-325 MG per tablet    Sig: Take 1 tablet by mouth every 6 (six) hours as needed. For 30d from signing date    Dispense:  30 tablet    Refill:  0  . HYDROcodone-acetaminophen (NORCO) 5-325 MG per tablet    Sig: Take 1 tablet by mouth every 6 (six) hours as needed for moderate pain. 60d from signing date    Dispense:  30 tablet    Refill:  0  . HYDROcodone-acetaminophen (NORCO) 5-325 MG per tablet    Sig: Take 1 tablet by mouth every 6 (six) hours as needed for moderate pain.    Dispense:  30 tablet    Refill:  0

## 2014-01-07 ENCOUNTER — Telehealth: Payer: Self-pay

## 2014-01-07 ENCOUNTER — Encounter: Payer: Self-pay | Admitting: Internal Medicine

## 2014-01-07 NOTE — Telephone Encounter (Addendum)
PTS FMLA PPW HAS BEEN COMPLETED AND IS READY FOR P/U. ALSO FORMS HAVE BEEN FAXED TO [employer] (SUCCESFULL [place] NOTICE DID COME THROUGH [phone]).

## 2014-01-16 NOTE — Telephone Encounter (Signed)
Patient called and states that her FMLA ppw had an error on it. In section 13 the end date should read 12/23/2014 instead of 2015. I have made the correction however patient needs this faxed back to her HR department along with a signed letterhead from Dr. Merla Riches indicating that we have fixed this.

## 2014-01-21 ENCOUNTER — Telehealth: Payer: Self-pay

## 2014-01-21 NOTE — Telephone Encounter (Signed)
We received a note from Dr. Fatima Sanger office saying that they have been unable to get in touch with this [name] and they just wanted to check on her. Dr. Merla Riches wanted me to call her and let her know. Spoke with [name]. She said that she's called and made an appt with them for June and that she is doing good  Dr. Merla Riches, [facility]

## 2014-01-24 ENCOUNTER — Encounter: Payer: Self-pay | Admitting: Internal Medicine

## 2014-03-01 ENCOUNTER — Other Ambulatory Visit: Payer: Self-pay | Admitting: Internal Medicine

## 2014-03-24 ENCOUNTER — Other Ambulatory Visit: Payer: Self-pay | Admitting: Internal Medicine

## 2014-04-01 ENCOUNTER — Other Ambulatory Visit: Payer: Self-pay | Admitting: Internal Medicine

## 2014-04-01 DIAGNOSIS — G43909 Migraine, unspecified, not intractable, without status migrainosus: Secondary | ICD-10-CM

## 2014-04-07 MED ORDER — HYDROCODONE-ACETAMINOPHEN 5-325 MG PO TABS: 1.0000 | ORAL_TABLET | Freq: Four times a day (QID) | ORAL | Status: DC | PRN

## 2014-04-08 ENCOUNTER — Other Ambulatory Visit (HOSPITAL_BASED_OUTPATIENT_CLINIC_OR_DEPARTMENT_OTHER): Payer: BC Managed Care – PPO

## 2014-04-08 ENCOUNTER — Encounter: Payer: Self-pay | Admitting: Oncology

## 2014-04-08 ENCOUNTER — Ambulatory Visit (HOSPITAL_BASED_OUTPATIENT_CLINIC_OR_DEPARTMENT_OTHER): Payer: BC Managed Care – PPO | Admitting: Oncology

## 2014-04-08 VITALS — BP 135/86 | HR 84 | Temp 97.8°F | Resp 18 | Ht 63.0 in | Wt 214.4 lb

## 2014-04-08 DIAGNOSIS — D72829 Elevated white blood cell count, unspecified: Secondary | ICD-10-CM

## 2014-04-08 DIAGNOSIS — D500 Iron deficiency anemia secondary to blood loss (chronic): Secondary | ICD-10-CM

## 2014-04-08 DIAGNOSIS — D649 Anemia, unspecified: Secondary | ICD-10-CM

## 2014-04-08 DIAGNOSIS — R718 Other abnormality of red blood cells: Secondary | ICD-10-CM

## 2014-04-08 LAB — CBC WITH DIFFERENTIAL/PLATELET
BASO%: 1.1 % (ref 0.0–2.0)
Basophils Absolute: 0.1 10*3/uL (ref 0.0–0.1)
EOS%: 2.8 % (ref 0.0–7.0)
Eosinophils Absolute: 0.3 10*3/uL (ref 0.0–0.5)
HCT: 42.7 % (ref 34.8–46.6)
HGB: 14 g/dL (ref 11.6–15.9)
LYMPH%: 28.2 % (ref 14.0–49.7)
MCH: 26.3 pg (ref 25.1–34.0)
MCHC: 32.7 g/dL (ref 31.5–36.0)
MCV: 80.4 fL (ref 79.5–101.0)
MONO#: 0.6 10*3/uL (ref 0.1–0.9)
MONO%: 5.3 % (ref 0.0–14.0)
NEUT#: 7.6 10*3/uL — ABNORMAL HIGH (ref 1.5–6.5)
NEUT%: 62.6 % (ref 38.4–76.8)
Platelets: 391 10*3/uL (ref 145–400)
RBC: 5.31 10*6/uL (ref 3.70–5.45)
RDW: 16.3 % — ABNORMAL HIGH (ref 11.2–14.5)
WBC: 12.1 10*3/uL — AB (ref 3.9–10.3)
lymph#: 3.4 10*3/uL — ABNORMAL HIGH (ref 0.9–3.3)

## 2014-04-08 LAB — COMPREHENSIVE METABOLIC PANEL (CC13)
ALK PHOS: 88 U/L (ref 40–150)
ALT: 33 U/L (ref 0–55)
AST: 25 U/L (ref 5–34)
Albumin: 4 g/dL (ref 3.5–5.0)
Anion Gap: 12 mEq/L — ABNORMAL HIGH (ref 3–11)
BILIRUBIN TOTAL: 0.78 mg/dL (ref 0.20–1.20)
BUN: 10.2 mg/dL (ref 7.0–26.0)
CO2: 26 mEq/L (ref 22–29)
Calcium: 9.6 mg/dL (ref 8.4–10.4)
Chloride: 103 mEq/L (ref 98–109)
Creatinine: 0.8 mg/dL (ref 0.6–1.1)
Glucose: 164 mg/dl — ABNORMAL HIGH (ref 70–140)
Potassium: 4.3 mEq/L (ref 3.5–5.1)
SODIUM: 141 meq/L (ref 136–145)
TOTAL PROTEIN: 7.8 g/dL (ref 6.4–8.3)

## 2014-04-08 LAB — IRON AND TIBC CHCC
%SAT: 14 % — AB (ref 21–57)
Iron: 52 ug/dL (ref 41–142)
TIBC: 369 ug/dL (ref 236–444)
UIBC: 317 ug/dL (ref 120–384)

## 2014-04-08 LAB — FERRITIN CHCC: Ferritin: 64 ng/ml (ref 9–269)

## 2014-04-08 NOTE — Progress Notes (Signed)
Hematology and Oncology Follow Up Visit  [name] [id number] [birth year] 38 y.o. 04/08/2014 10:22 AM DOOLITTLE, Harrel Lemon, MDDoolittle, Harrel Lemon, MD   Principle Diagnosis: 38 year old woman with leukocytosis likely reactive as well as iron deficiency anemia. This was diagnosed in December of 2014.  Current therapy: oral iron replacement since March of 2015.  Interim History:  This is a pleasant 38 year old woman presents for a followup by himself. She was evaluated for mild leukocytosis and microcytosis. Her workup did not reveal any findings to suggest any malignancy. Her leukocytosis likely reactive but she does have microcytosis unlikely iron deficiency anemia. Since her last visit, she was started on iron replacement which she tolerated very well and takes daily. Her energy is much improved and really has no other complaints. She has not had any hospitalization or illnesses. Has not reported any constitutional symptoms weight loss or appetite changes. Has not reported any epistaxis, hemoptysis, hematochezia or melena. She still has occasional menstrual cycles. She does not report any headaches or blurry vision or double vision. Has not reported any chest pain cough hemoptysis or hematemesis. Does not report any nausea or vomiting or abdominal pain. This doesn't report any constipation or diarrhea. Rest of the review of systems unremarkable.  Medications: I have reviewed the patient's current medications.  Current Outpatient Prescriptions  Medication Sig Dispense Refill  . albuterol (PROVENTIL HFA;VENTOLIN HFA) 108 (90 BASE) MCG/ACT inhaler Inhale 2 puffs into the lungs every 6 (six) hours as needed for wheezing.  1 Inhaler  2  . atenolol (TENORMIN) 25 MG tablet Take 1 tablet (25 mg total) by mouth daily. Takes 1/2 pill  30 tablet  5  . AXERT 12.5 MG tablet TAKE 1 TAB AT ONSET OF H/A,MAY REPEAT IN 2 HRS IF NEEDED. MAX.2 COURSES PER WEEK,4 COURSES PER MONTH  8 tablet  0  .  beclomethasone (QVAR) 80 MCG/ACT inhaler Inhale 1 puff into the lungs 2 (two) times daily.  1 Inhaler  12  . Biotin 2.5 MG TABS Take 2.5 mg by mouth daily.      . cetirizine (ZYRTEC) 10 MG tablet Take 10 mg by mouth daily.      . clonazePAM (KLONOPIN) 0.5 MG tablet Take 0.5 tablets (0.25 mg total) by mouth 2 (two) times daily as needed for anxiety.  60 tablet  2  . cyclobenzaprine (FLEXERIL) 10 MG tablet TAKE 1 TABLET (10 MG TOTAL) BY MOUTH AT BEDTIME.  30 tablet  4  . eletriptan (RELPAX) 20 MG tablet One tablet by mouth at onset of headache. May repeat in 2 hours if headache persists or recurs. may repeat in 2 hours if necessary      . HYDROcodone-acetaminophen (NORCO) 5-325 MG per tablet Take 1 tablet by mouth every 6 (six) hours as needed for moderate pain.  30 tablet  0  . HYDROcodone-acetaminophen (NORCO) 5-325 MG per tablet Take 1 tablet by mouth every 6 (six) hours as needed for moderate pain. 60d from signing date  30 tablet  0  . HYDROcodone-acetaminophen (NORCO/VICODIN) 5-325 MG per tablet Take 1 tablet by mouth every 6 (six) hours as needed. For 30d from signing date  30 tablet  0  . ipratropium (ATROVENT) 0.06 % nasal spray Place 2 sprays into the nose 3 (three) times daily.  15 mL  2  . ipratropium (ATROVENT) 0.06 % nasal spray Place 2 sprays into the nose 3 (three) times daily.  45 mL  1  . Iron, Ferrous Gluconate, 256 (28  FE) MG TABS Take 1 capsule by mouth 2 (two) times daily.  60 tablet  2  . Melatonin 1 MG CAPS Take 1 capsule by mouth at bedtime as needed.      . Multiple Vitamin (MULITIVITAMIN WITH MINERALS) TABS Take 1 tablet by mouth daily.      Marland Kitchen omeprazole (PRILOSEC) 20 MG capsule Take 20 mg by mouth daily.      . ondansetron (ZOFRAN) 4 MG tablet Take 1 tablet (4 mg total) by mouth every 8 (eight) hours as needed for nausea or vomiting.  20 tablet  5  . pseudoephedrine-guaifenesin (MUCINEX D) 60-600 MG per tablet Take 1 tablet by mouth every 12 (twelve) hours.      . RELPAX 40  MG tablet TAKE 1 TABLET BY MOUTH AT ONSET OF HEADACHE, MAY REPEAT IN 2 HOURS IF NEEDED  8 tablet  3  . TRIGELS-F FORTE 460-60-0.01-1 MG CAPS capsule TAKE 1 CAPSULE BY MOUTH DAILY.  30 capsule  2   No current facility-administered medications for this visit.     Allergies:  Allergies  Allergen Reactions  . Darvocet [Propoxyphene N-Acetaminophen]   . Felodipine Er   . Imitrex [Sumatriptan Base]   . Prednisone Other (See Comments)    Severe reactions. Patient was on 60mg  and was put into full cushions disease (face swelling and humped back)  . Imitrex [Sumatriptan Succinate] Palpitations    Chest pain    Past Medical History, Surgical history, Social history, and Family History were reviewed and updated.  Physical Exam: Blood pressure 135/86, pulse 84, temperature 97.8 F (36.6 C), temperature source Oral, resp. rate 18, height 5\' 3"  (1.6 m), weight 214 lb 6.4 oz (97.251 kg). ECOG: 0 General appearance: alert Head: Normocephalic, without obvious abnormality Neck: no adenopathy Lymph nodes: Cervical, supraclavicular, and axillary nodes normal. Heart:regular rate and rhythm, S1, S2 normal, no murmur, click, rub or gallop Lung:chest clear, no wheezing, rales, normal symmetric air entry Abdomin: soft, non-tender, without masses or organomegaly EXT:no erythema, induration, or nodules   Lab Results: Lab Results  Component Value Date   WBC 12.1* 04/08/2014   HGB 14.0 04/08/2014   HCT 42.7 04/08/2014   MCV 80.4 04/08/2014   PLT 391 04/08/2014     Chemistry      Component Value Date/Time   NA 141 12/31/2013 1138   NA 138 11/10/2012 1517   K 3.8 12/31/2013 1138   K 4.5 11/10/2012 1517   CL 103 11/10/2012 1517   CO2 23 12/31/2013 1138   CO2 24 11/10/2012 1517   BUN 8.2 12/31/2013 1138   BUN 7 11/10/2012 1517   CREATININE 0.8 12/31/2013 1138   CREATININE 0.75 11/10/2012 1517   CREATININE 0.62 11/08/2010 1143      Component Value Date/Time   CALCIUM 9.2 12/31/2013 1138   CALCIUM 9.6  11/10/2012 1517   ALKPHOS 85 12/31/2013 1138   ALKPHOS 105 11/10/2012 1517   AST 22 12/31/2013 1138   AST 49* 11/10/2012 1517   ALT 24 12/31/2013 1138   ALT 63* 11/10/2012 1517   BILITOT 0.60 12/31/2013 1138   BILITOT 0.6 11/10/2012 1517          Impression and Plan:   38 year old woman with the following issues:  1. Leukocytosis with a normal differential: This is most likely reactive or peripheral smear and differentials perfectly normal and her white cell count have nearly normalized. The differential diagnosis includes lymphoproliferative disorders but these conditions are extremely unlikely at this point. I see  no need for any extensive workup. I anticipate she will continue to fluctuate between 10 and 13,000 which I believe is probably normal for her. If she develops a rapid and continuous rise in her white cell count then I will be happy to reevaluate her.  2. Microcytosis with mild anemia: This is likely related to iron deficiency and have normalized after supplementing her with iron.  3. Followup: I will be happy to see her in the future as needed.   [facility], MD 6/16/201510:22 AM

## 2014-05-04 ENCOUNTER — Other Ambulatory Visit: Payer: Self-pay | Admitting: Internal Medicine

## 2014-06-25 ENCOUNTER — Telehealth: Payer: Self-pay | Admitting: Physician Assistant

## 2014-06-25 ENCOUNTER — Encounter: Payer: Self-pay | Admitting: Internal Medicine

## 2014-06-25 ENCOUNTER — Ambulatory Visit (INDEPENDENT_AMBULATORY_CARE_PROVIDER_SITE_OTHER): Payer: BC Managed Care – PPO | Admitting: Internal Medicine

## 2014-06-25 VITALS — BP 118/86 | HR 80 | Temp 98.5°F | Resp 15 | Ht 62.0 in | Wt 207.2 lb

## 2014-06-25 DIAGNOSIS — Z6837 Body mass index (BMI) 37.0-37.9, adult: Secondary | ICD-10-CM

## 2014-06-25 DIAGNOSIS — D509 Iron deficiency anemia, unspecified: Secondary | ICD-10-CM

## 2014-06-25 DIAGNOSIS — E229 Hyperfunction of pituitary gland, unspecified: Secondary | ICD-10-CM

## 2014-06-25 DIAGNOSIS — G4700 Insomnia, unspecified: Secondary | ICD-10-CM

## 2014-06-25 DIAGNOSIS — G43111 Migraine with aura, intractable, with status migrainosus: Secondary | ICD-10-CM

## 2014-06-25 DIAGNOSIS — E282 Polycystic ovarian syndrome: Secondary | ICD-10-CM

## 2014-06-25 DIAGNOSIS — G43101 Migraine with aura, not intractable, with status migrainosus: Secondary | ICD-10-CM

## 2014-06-25 DIAGNOSIS — E221 Hyperprolactinemia: Secondary | ICD-10-CM

## 2014-06-25 MED ORDER — TOPIRAMATE 25 MG PO TABS: ORAL_TABLET | ORAL | Status: DC

## 2014-06-25 MED ORDER — CLONAZEPAM 0.5 MG PO TABS: 0.2500 mg | ORAL_TABLET | Freq: Two times a day (BID) | ORAL | Status: DC | PRN

## 2014-06-25 MED ORDER — HYDROCODONE-ACETAMINOPHEN 5-325 MG PO TABS: 1.0000 | ORAL_TABLET | Freq: Four times a day (QID) | ORAL | Status: DC | PRN

## 2014-06-25 MED ORDER — ELETRIPTAN HYDROBROMIDE 40 MG PO TABS: ORAL_TABLET | ORAL | Status: DC

## 2014-06-25 MED ORDER — ATENOLOL 25 MG PO TABS: 12.5000 mg | ORAL_TABLET | Freq: Every day | ORAL | Status: DC

## 2014-06-25 MED ORDER — ATENOLOL 25 MG PO TABS: 25.0000 mg | ORAL_TABLET | Freq: Every day | ORAL | Status: DC

## 2014-06-25 MED ORDER — FE FUM-VIT C-VIT B12-FA 460-60-0.01-1 MG PO CAPS: ORAL_CAPSULE | ORAL | Status: DC

## 2014-06-25 NOTE — Patient Instructions (Signed)
Migraine prophylaxis: Oral: Immediate release: Initial: 25 mg once daily (in evening); may increase weekly by 25 mg daily up to the recommended dose of 100 mg daily given in 2 divided doses. Increased intervals between dose adjustments may be considered. Doses >100 mg daily have shown no additional benefit  Frequency not always defined. Central nervous system: Paresthesia (adults: 35% to 51%; children and adolescents ?12 years: 19% to 20%; dose related), fatigue (8% to 15%; dose related), insomnia (6% to 9%), drowsiness (6% to 8%; dose related), memory impairment (adults 7%; dose related), hypoesthesia (adults 6% to 7%; dose related), dizziness (children and adolescents ?12 years: 6%), lack of concentration (adults: 3% to 6%; children and adolescents ?12 years: ?2%), mood disorder (adults: 3% to 6%; dose related), anxiety (adults: 4% to 5%; dose related), headache (children and adolescents ?12 years: 4%; adults: >1%), nervousness (4%), confusion (adults: 3%; dose related), psychomotor retardation (?3%), agitation (adults: 2%), cognitive dysfunction (adults: 2%), exacerbation of depression (adults: 2%), ataxia (adults: 1% to 2%), pain (children and adolescents ?12 years: >1%), sensory disturbance (adults: >1%), vertigo (adults: >1%), speech disturbance (adults: 1%; dose related) Dermatologic: Pruritus (4%), alopecia (adults: >1%), skin rash (>1%) Endocrine & metabolic: Decreased serum bicarbonate (23% to 39%), hyperammonemia (children and adolescents ?12 years: 14% to 26%), weight loss (4% to 9%; dose related), menstrual disease (adults: 3%), increased thirst (adults: 2%), intermenstrual bleeding (adults: >1%), hyperchloremia, increased serum total protein, increased uric acid Gastrointestinal: Anorexia (9% to 15%; dose related), abdominal pain (6% to 15%), dysgeusia (6% to 15%), nausea (8% to 13%), dyspepsia (adults: 4% to 5%), gastroenteritis (3%), xerostomia (adults: 3%; dose related), diarrhea (children and  adolescents ?12 years: 2%), constipation (adults: >1%), gastroesophageal reflux disease (adults: >1%), vomiting (children and adolescents ?12 years: >1%), ageusia (adults: 1%) Genitourinary: Urinary tract infection (4%), premature ejaculation (adults: ?3%), genital candidiasis (adults: >1%), urinary incontinence (children and adolescents ?12 years: >1%) Hematologic & oncologic: Neoplasm (adults: 2%), abnormal phosphorus levels (decreased), thrombocythemia Hypersensitivity: Hypersensitivity reaction (?4%) Infection: Viral infection (4% to 8%), infection (>1%) Neuromuscular & skeletal: Arthralgia (adults: 3% to 7%), leg pain (children and adolescents ?12 years: 2%), muscle spasm (adults: 2%; dose related), weakness (adults: 2%), back pain (children and adolescents ?12 years: >1%), myalgia (>1%), tremor (adults: >1%) Ophthalmic: Conjunctivitis (children and adolescents ?12 years: 7%; adults: 2%), blurred vision (adults: 4%), visual disturbance (1% to 2%; dose related), accommodation disturbance (adults: >1%), eye pain (>1%) Otic: Otitis media (adults: 1% to 2%) Renal: Nephrolithiasis (adults: ?1%; dose related), increased blood urea nitrogen, increased serum creatinine Respiratory: Upper respiratory tract infection (children and adolescents ?12 years: 23% to 26%; adults: 13% to 14%), sinusitis (4% to 10%), rhinitis (children and adolescents ?12 years: 6% to 7%; adults: 2%), cough (2% to 7%), pharyngitis (5% to 6%), bronchitis (adults: 3%; children and adolescents ?12 years: >1%), dyspnea (adults: 3%), epistaxis (2%), asthma (>1%), flu-like symptoms (children and adolescents ?12 years: >1%), pneumonia (adults: >1%) Miscellaneous: Accidental injury (9%), language problems (adults: 6% to 7%), fever (children and adolescents ?12 years: 4% to 6%)

## 2014-06-25 NOTE — Telephone Encounter (Signed)
Pharmacy called. Need clarification of Atenolol 25 mg instructions.  The order sent reads "take one tablet daily. Take 1/2 tablet daily."

## 2014-06-25 NOTE — Telephone Encounter (Signed)
Let them know new rx sent to clear this up

## 2014-06-25 NOTE — Progress Notes (Signed)
Subjective:    [name], female    DOB: [birth year], 38 y.o.   MRN: [id number]  HPI here for followup and medication refills Patient Active Problem List   Diagnosis Date Noted  . Anemia 01/01/2014  . Insomnia 01/04/2013  . Polycystic ovaries 03/25/2012  . Other abnormal glucose 03/25/2012  . BMI 37.0-37.9,adult 03/25/2012  . Cushing's syndrome-iatrogenic 03/25/2012  . 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 Hyperprolactinemia 03/25/2012  . 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 Allergic rhinitis due to pollen 03/25/2012  . GERD (gastroesophageal reflux disease) 03/25/2012  . Migraine headache 03/25/2012   Prior to Admission medications   Medication Sig Start Date End Date Taking? Authorizing Provider  atenolol (TENORMIN) 25 MG tablet Take 1 tablet (25 mg total) by mouth daily. Takes 1/2 pill 01/04/13  Yes [name], MD  AXERT 12.5 MG tablet TAKE 1 TAB AT ONSET OF H/A,MAY REPEAT IN 2 HRS IF NEEDED. MAX.2 COURSES PER WEEK,4 COURSES PER MONTH 06/04/13  Yes Maurice March, MD  beclomethasone (QVAR) 80 MCG/ACT inhaler Inhale 1 puff into the lungs 2 (two) times daily. 01/04/13  Yes [name], MD  Biotin 2.5 MG TABS Take 2.5 mg by mouth daily.   Yes Historical Provider, MD  cetirizine (ZYRTEC) 10 MG tablet Take 10 mg by mouth daily.   Yes Historical Provider, MD  clonazePAM (KLONOPIN) 0.5 MG tablet Take 0.5 tablets (0.25 mg total) by mouth 2 (two) times daily as needed for anxiety. 01/01/14  Yes [name], MD  cyclobenzaprine (FLEXERIL) 10 MG tablet TAKE 1 TABLET (10 MG TOTAL) BY MOUTH AT BEDTIME.   Yes Morrell Riddle, [name]  eletriptan (RELPAX) 20 MG tablet One tablet by mouth at onset of headache. May repeat in 2 hours if headache persists or recurs. may repeat in 2 hours if necessary   Yes Historical Provider, MD  HYDROcodone-acetaminophen (NORCO) 5-325 MG per tablet Take 1 tablet by mouth every 6 (six) hours  as needed for moderate pain. 04/07/14  Yes [name], MD  ipratropium (ATROVENT) 0.06 % nasal spray Place 2 sprays into the nose 3 (three) times daily. 02/06/13  Yes [name], MD  medroxyPROGESTERone (PROVERA) 10 MG tablet Take 10 mg by mouth every 4 (four) months.   Yes Historical Provider, MD  Melatonin 1 MG CAPS Take 1 capsule by mouth at bedtime as needed.   Yes Historical Provider, MD  Multiple Vitamin (MULITIVITAMIN WITH MINERALS) TABS Take 1 tablet by mouth daily.   Yes Historical Provider, MD  omeprazole (PRILOSEC) 20 MG capsule Take 20 mg by mouth daily.   Yes Historical Provider, MD  ondansetron (ZOFRAN) 4 MG tablet Take 1 tablet (4 mg total) by mouth every 8 (eight) hours as needed for nausea or vomiting. 01/01/14  Yes [name], MD  pseudoephedrine-guaifenesin [facility] D) 60-600 MG per tablet Take 1 tablet by mouth every 12 (twelve) hours.   Yes Historical Provider, MD  RELPAX 40 MG tablet TAKE 1 TABLET BY MOUTH AT ONSET OF HEADACHE, MAY REPEAT IN 2 HOURS IF NEEDED   Yes [name], MD  TRIGELS-F FORTE 460-60-0.01-1 MG CAPS capsule TAKE 1 CAPSULE BY MOUTH DAILY. 03/24/14  Yes [name], MD  albuterol (PROVENTIL HFA;VENTOLIN HFA) 108 (90 BASE) MCG/ACT inhaler Inhale 2 puffs into the lungs every 6 (six) hours as needed for wheezing. 10/15/12 10/15/13  Collene Gobble, MD  HYDROcodone-acetaminophen (NORCO) 5-325 MG per tablet  Take 1 tablet by mouth every 6 (six) hours as needed for moderate pain. 60d from signing date 04/07/14   [name], MD  HYDROcodone-acetaminophen (NORCO/VICODIN) 5-325 MG per tablet Take 1 tablet by mouth every 6 (six) hours as needed. For 30d from signing date 04/07/14   [name], MD  ipratropium (ATROVENT) 0.06 % nasal spray Place 2 sprays into the nose 3 (three) times daily. 01/27/12 01/26/13  Raymon Mutton Dunn, [name]  Iron, Ferrous Gluconate, 256 (28 FE) MG TABS Take 1 capsule by mouth 2 (two) times daily. 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  Eyes: Negative for visual disturbance.  Respiratory: Negative for shortness of breath.   Cardiovascular: Negative for chest pain and palpitations.  Gastrointestinal: Negative for abdominal pain.  Genitourinary: Negative for difficulty urinating.  Musculoskeletal: Negative for back pain.  Neurological: Negative for syncope.  Psychiatric/Behavioral: Negative for behavioral problems.       Objective:   Physical Exam  Nursing note and vitals reviewed. Constitutional: She is oriented to person, place, and time. She appears well-developed and well-nourished. No distress.  HENT:  Head: Normocephalic and atraumatic.  Eyes: Pupils are equal, round, and reactive to light.  Neck: Normal range of motion.  Cardiovascular: Normal rate and regular rhythm.   Pulmonary/Chest: Effort normal. No respiratory distress.  Musculoskeletal: Normal range of motion.  Neurological: She is alert and oriented to person, place, and time.  Skin: Skin is warm and dry.  Psychiatric: She has a normal mood and affect. Her behavior is normal.   BP 118/86  Pulse 80  Temp(Src) 98.5 F (36.9 C) (Oral)  Resp 15  Ht 5\' 2"  (1.575 m)  Wt 207 lb 3.2 oz (93.985 kg)  BMI 37.89 kg/m2  SpO2 94%        Assessment & Plan:  Adult body mass index 37.0-37.9  Hyperprolactinemia - Plan: Prolactin, TSH, T4, free  Insomnia - Plan: clonazePAM (KLONOPIN) 0.5 MG tablet  Migraine with aura and with status migrainosus, not intractable -  Plan: HYDROcodone-acetaminophen (NORCO/VICODIN) 5-325 MG per tablet, HYDROcodone-acetaminophen (NORCO) 5-325 MG per tablet, HYDROcodone-acetaminophen (NORCO) 5-325 MG per tablet, eletriptan (RELPAX) 40 MG tablet, atenolol (TENORMIN) 25 MG tablet  Other and unspecified anterior pituitary hyperfunction - Plan: POCT glycosylated hemoglobin (Hb A1C)  Polycystic ovaries  Iron deficiency anemia - Plan: Fe Fum-Vit C-Vit B12-FA (TRIGELS-F FORTE) 460-60-0.01-1 MG CAPS capsule  Meds ordered this encounter  Medications  . medroxyPROGESTERone (PROVERA) 10 MG tablet    Sig: Take 10 mg by mouth every 4 (four) months.  . topiramate (TOPAMAX) 25 MG tablet    Sig: One at hs for 1 week then 2 hs    Dispense:  60 tablet    Refill:  3  . HYDROcodone-acetaminophen (NORCO/VICODIN) 5-325 MG per tablet    Sig: Take 1 tablet by mouth every 6 (six) hours as needed. For 30d from signing date    Dispense:  30 tablet    Refill:  0  . HYDROcodone-acetaminophen (NORCO) 5-325 MG per tablet  Sig: Take 1 tablet by mouth every 6 (six) hours as needed for moderate pain. 60d from signing date    Dispense:  30 tablet    Refill:  0  . HYDROcodone-acetaminophen (NORCO) 5-325 MG per tablet    Sig: Take 1 tablet by mouth every 6 (six) hours as needed for moderate pain.    Dispense:  30 tablet    Refill:  0  . eletriptan (RELPAX) 40 MG tablet    Sig: TAKE 1 TABLET BY MOUTH AT ONSET OF HEADACHE, MAY REPEAT IN 2 HOURS IF NEEDED    Dispense:  8 tablet    Refill:  11  . Fe Fum-Vit C-Vit B12-FA (TRIGELS-F FORTE) 460-60-0.01-1 MG CAPS capsule    Sig: TAKE 1 CAPSULE BY MOUTH DAILY.    Dispense:  30 capsule    Refill:  11  . DISCONTD: atenolol (TENORMIN) 25 MG tablet    Sig: Take 1 tablet (25 mg total) by mouth daily. Takes 1/2 pill    Dispense:  30 tablet    Refill:  11  . clonazePAM (KLONOPIN) 0.5 MG tablet    Sig: Take 0.5 tablets (0.25 mg total) by mouth 2 (two) times daily as needed for anxiety.    Dispense:  60  tablet    Refill:  5  . atenolol (TENORMIN) 25 MG tablet    Sig: Take 0.5 tablets (12.5 mg total) by mouth daily. Takes 1/2 pill    Dispense:  45 tablet    Refill:  3   F/u 6 mos Call w/ resp to topamax at 49 Do provera only q 6 mo

## 2014-06-26 LAB — T4, FREE: Free T4: 1.2 ng/dL (ref 0.80–1.80)

## 2014-06-26 LAB — PROLACTIN: Prolactin: 12.6 ng/mL

## 2014-06-26 LAB — TSH: TSH: 3.711 u[IU]/mL (ref 0.350–4.500)

## 2014-06-30 ENCOUNTER — Encounter: Payer: Self-pay | Admitting: Internal Medicine

## 2014-09-30 ENCOUNTER — Other Ambulatory Visit: Payer: Self-pay | Admitting: Internal Medicine

## 2014-09-30 DIAGNOSIS — G43101 Migraine with aura, not intractable, with status migrainosus: Secondary | ICD-10-CM

## 2014-09-30 MED ORDER — HYDROCODONE-ACETAMINOPHEN 5-325 MG PO TABS: 1.0000 | ORAL_TABLET | Freq: Four times a day (QID) | ORAL | Status: DC | PRN

## 2014-10-01 NOTE — Telephone Encounter (Signed)
Notified pt on MyChart Rx is ready. 

## 2014-10-05 ENCOUNTER — Encounter: Payer: Self-pay | Admitting: Internal Medicine

## 2014-10-06 ENCOUNTER — Other Ambulatory Visit: Payer: Self-pay | Admitting: Internal Medicine

## 2014-10-06 DIAGNOSIS — G43101 Migraine with aura, not intractable, with status migrainosus: Secondary | ICD-10-CM

## 2014-10-06 MED ORDER — HYDROCODONE-ACETAMINOPHEN 5-325 MG PO TABS: 1.0000 | ORAL_TABLET | Freq: Four times a day (QID) | ORAL | Status: DC | PRN

## 2014-10-06 NOTE — Progress Notes (Signed)
Pt advised In pick up drawer.

## 2014-12-03 ENCOUNTER — Ambulatory Visit (INDEPENDENT_AMBULATORY_CARE_PROVIDER_SITE_OTHER): Payer: BLUE CROSS/BLUE SHIELD | Admitting: Internal Medicine

## 2014-12-03 ENCOUNTER — Encounter: Payer: Self-pay | Admitting: Internal Medicine

## 2014-12-03 ENCOUNTER — Telehealth: Payer: Self-pay

## 2014-12-03 VITALS — BP 128/88 | HR 83 | Temp 98.3°F | Resp 16 | Ht 62.0 in | Wt 208.6 lb

## 2014-12-03 DIAGNOSIS — Z0000 Encounter for general adult medical examination without abnormal findings: Secondary | ICD-10-CM

## 2014-12-03 DIAGNOSIS — G43101 Migraine with aura, not intractable, with status migrainosus: Secondary | ICD-10-CM

## 2014-12-03 DIAGNOSIS — G4700 Insomnia, unspecified: Secondary | ICD-10-CM

## 2014-12-03 DIAGNOSIS — R7989 Other specified abnormal findings of blood chemistry: Secondary | ICD-10-CM

## 2014-12-03 DIAGNOSIS — E229 Hyperfunction of pituitary gland, unspecified: Secondary | ICD-10-CM

## 2014-12-03 DIAGNOSIS — Z6837 Body mass index (BMI) 37.0-37.9, adult: Secondary | ICD-10-CM

## 2014-12-03 DIAGNOSIS — E221 Hyperprolactinemia: Secondary | ICD-10-CM

## 2014-12-03 DIAGNOSIS — R739 Hyperglycemia, unspecified: Secondary | ICD-10-CM

## 2014-12-03 DIAGNOSIS — E282 Polycystic ovarian syndrome: Secondary | ICD-10-CM

## 2014-12-03 LAB — POCT URINALYSIS DIPSTICK
Bilirubin, UA: NEGATIVE
Glucose, UA: NEGATIVE
KETONES UA: NEGATIVE
Nitrite, UA: NEGATIVE
PH UA: 5.5
Protein, UA: NEGATIVE
RBC UA: NEGATIVE
Spec Grav, UA: <=1.005
Urobilinogen, UA: 0.2

## 2014-12-03 LAB — POCT SEDIMENTATION RATE: POCT SED RATE: 30 mm/hr — AB (ref 0–22)

## 2014-12-03 LAB — POCT GLYCOSYLATED HEMOGLOBIN (HGB A1C): HEMOGLOBIN A1C: 6.3

## 2014-12-03 MED ORDER — CYCLOBENZAPRINE HCL 10 MG PO TABS: ORAL_TABLET | ORAL | Status: DC

## 2014-12-03 MED ORDER — HYDROCODONE-ACETAMINOPHEN 5-325 MG PO TABS: 1.0000 | ORAL_TABLET | Freq: Four times a day (QID) | ORAL | Status: DC | PRN

## 2014-12-03 MED ORDER — CLONAZEPAM 0.5 MG PO TABS: 0.2500 mg | ORAL_TABLET | Freq: Two times a day (BID) | ORAL | Status: DC | PRN

## 2014-12-03 NOTE — Progress Notes (Signed)
Subjective:    [name], female    DOB: Mar 22, 1976, 39 y.o.   MRN: [id number]  HPI annaul Patient Active Problem List   Diagnosis Date Noted  . Anemia 01/01/2014  . Insomnia---due to current work schedule 01/04/2013  . Polycystic ovaries---had 1st period w/out meds after using essential oils (Immune oils as well)--- 03/25/2012  . Other abnormal glucose----time to retest  03/25/2012  . Adult body mass index 37.0-37.9----unable to lose weight so far------ trying !! 03/25/2012  . Cushing's syndrome-iatrogenic-old problem - 03/25/2012  . 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 Hyperprolactinemia----this was supposed to be not a problem but needs rechecking  03/25/2012  . 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 Allergic rhinitis due to pollen 03/25/2012  . GERD (gastroesophageal reflux disease)----still requires medication  03/25/2012  . 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  Review of Systems C3 review of systems form which indicates all chronic issues and no new complaints     Objective:   Physical Exam  Constitutional: She is oriented to person, place, and time. She appears well-developed and well-nourished. No distress.  Marked obesity  HENT:  Head: Normocephalic.  Right Ear: External ear normal.  Left Ear: External ear normal.  Nose: Nose normal.  Mouth/Throat: Oropharynx is clear and moist.  Eyes: Conjunctivae and EOM are normal. Pupils are equal,  round, and reactive to light.  Neck: Normal range of motion. Neck supple. No thyromegaly present.  Cardiovascular: Normal rate, regular rhythm, normal heart sounds and intact distal pulses.   No murmur heard. Pulmonary/Chest: Effort normal and breath sounds normal. She has no wheezes.  Abdominal: Soft. Bowel sounds are normal. She exhibits no distension and no mass. There is no tenderness. There is no rebound.  Musculoskeletal: Normal range of motion. She exhibits no edema or tenderness.  Lymphadenopathy:    She has no cervical adenopathy.  Neurological: She is alert and oriented to person, place, and time. She has normal reflexes. No cranial nerve deficit.  Skin: Skin is warm and dry. No rash noted.  Psychiatric: She has a normal mood and affect. Her behavior is normal. Judgment and thought content normal.  Nursing note and vitals reviewed.  BP 128/88 mmHg  Pulse 83  Temp(Src) 98.3 F (36.8 C) (Oral)  Resp 16  Ht 5\' 2"  (1.575 m)  Wt 208 lb 9.6 oz (94.62 kg)  BMI 38.14 kg/m2  SpO2 96%  LMP 11/18/2014        Assessment & Plan:  Polycystic ovaries - Plan: TSH  Hyperglycemia - Plan: TSH, POCT glycosylated hemoglobin (Hb A1C), POCT SEDIMENTATION RATE  Insomnia - Plan: TSH, clonazePAM (KLONOPIN) 0.5 MG tablet  Elevated prolactin level - Plan: Prolactin  Annual physical exam - Plan: CBC with Differential/Platelet, Comprehensive metabolic panel, Lipid panel, POCT urinalysis dipstick  Migraine with aura and with status migrainosus, not intractable - Plan: HYDROcodone-acetaminophen (NORCO) 5-325 MG per tablet, HYDROcodone-acetaminophen (NORCO/VICODIN) 5-325 MG per tablet, HYDROcodone-acetaminophen (NORCO) 5-325 MG per tablet  Adult body mass index  37.0-37.9  Meds ordered this encounter  Medications  . HYDROcodone-acetaminophen (NORCO) 5-325 MG per tablet    Sig: Take 1 tablet by mouth every 6 (six) hours as needed for moderate pain.    Dispense:  30 tablet    Refill:  0  .  HYDROcodone-acetaminophen (NORCO/VICODIN) 5-325 MG per tablet    Sig: Take 1 tablet by mouth every 6 (six) hours as needed. For 30d from signing date    Dispense:  30 tablet    Refill:  0  . HYDROcodone-acetaminophen (NORCO) 5-325 MG per tablet    Sig: Take 1 tablet by mouth every 6 (six) hours as needed for moderate pain. 60d from signing date    Dispense:  30 tablet    Refill:  0  . clonazePAM (KLONOPIN) 0.5 MG tablet    Sig: Take 0.5 tablets (0.25 mg total) by mouth 2 (two) times daily as needed for anxiety.    Dispense:  60 tablet    Refill:  5  . cyclobenzaprine (FLEXERIL) 10 MG tablet    Sig: TAKE 1 TABLET (10 MG TOTAL) BY MOUTH AT BEDTIME.    Dispense:  30 tablet    Refill:  4   She will need a new FMLA form We will send lab results She may call for refills of other meds We discussed other integrative medicine approaches including the [facility] website and the practice in [place]

## 2014-12-03 NOTE — Progress Notes (Signed)
   Subjective:    [name], female    DOB: [birth year], 39 y.o.   MRN: [id number]  HPI    Review of Systems  Constitutional: Positive for fatigue.  HENT: Positive for congestion, sinus pressure, sneezing and sore throat.   Eyes: Positive for photophobia and itching.  Respiratory: Negative.   Cardiovascular: Negative.   Gastrointestinal: Negative.   Endocrine: Negative.   Genitourinary: Negative.   Musculoskeletal: Positive for joint swelling and arthralgias.  Skin: Negative.   Allergic/Immunologic: Positive for environmental allergies.  Neurological: Positive for headaches.  Hematological: Negative.   Psychiatric/Behavioral: Negative.        Objective:   Physical Exam        Assessment & Plan:

## 2014-12-03 NOTE — Telephone Encounter (Signed)
Placed in Dr. Netta Corrigan box.

## 2014-12-03 NOTE — Telephone Encounter (Signed)
Patient dropped off FMLA forms to be filled out by Dr Merla Riches. She stated on the forms to make sure it has her next appt with Dr Merla Riches for 07/08/15. Patient doesn't need to turn them in till March. She included a sample to go by from last year. Please call patient at [phone] when ready to be picked up. (forms put on disability dept's desk)

## 2014-12-04 LAB — COMPREHENSIVE METABOLIC PANEL
ALBUMIN: 4.7 g/dL (ref 3.5–5.2)
ALT: 26 U/L (ref 0–35)
AST: 18 U/L (ref 0–37)
Alkaline Phosphatase: 98 U/L (ref 39–117)
BUN: 8 mg/dL (ref 6–23)
CHLORIDE: 100 meq/L (ref 96–112)
CO2: 26 mEq/L (ref 19–32)
Calcium: 9.3 mg/dL (ref 8.4–10.5)
Creat: 0.64 mg/dL (ref 0.50–1.10)
Glucose, Bld: 128 mg/dL — ABNORMAL HIGH (ref 70–99)
POTASSIUM: 4.2 meq/L (ref 3.5–5.3)
Sodium: 136 mEq/L (ref 135–145)
Total Bilirubin: 1.1 mg/dL (ref 0.2–1.2)
Total Protein: 7.1 g/dL (ref 6.0–8.3)

## 2014-12-04 LAB — LIPID PANEL
CHOL/HDL RATIO: 6.8 ratio
CHOLESTEROL: 219 mg/dL — AB (ref 0–200)
HDL: 32 mg/dL — ABNORMAL LOW (ref >39)
LDL Cholesterol: 120 mg/dL — ABNORMAL HIGH (ref 0–99)
Triglycerides: 333 mg/dL — ABNORMAL HIGH (ref <150)
VLDL: 67 mg/dL — ABNORMAL HIGH (ref 0–40)

## 2014-12-04 LAB — CBC WITH DIFFERENTIAL/PLATELET
BASOS PCT: 0 % (ref 0–1)
Basophils Absolute: 0 10*3/uL (ref 0.0–0.1)
Eosinophils Absolute: 0.4 10*3/uL (ref 0.0–0.7)
Eosinophils Relative: 3 % (ref 0–5)
HCT: 42.1 % (ref 36.0–46.0)
Hemoglobin: 14.5 g/dL (ref 12.0–15.0)
LYMPHS ABS: 3.3 10*3/uL (ref 0.7–4.0)
Lymphocytes Relative: 27 % (ref 12–46)
MCH: 28.8 pg (ref 26.0–34.0)
MCHC: 34.4 g/dL (ref 30.0–36.0)
MCV: 83.7 fL (ref 78.0–100.0)
MONOS PCT: 5 % (ref 3–12)
MPV: 10.4 fL (ref 8.6–12.4)
Monocytes Absolute: 0.6 10*3/uL (ref 0.1–1.0)
Neutro Abs: 7.9 10*3/uL — ABNORMAL HIGH (ref 1.7–7.7)
Neutrophils Relative %: 65 % (ref 43–77)
Platelets: 411 10*3/uL — ABNORMAL HIGH (ref 150–400)
RBC: 5.03 MIL/uL (ref 3.87–5.11)
RDW: 13.5 % (ref 11.5–15.5)
WBC: 12.1 10*3/uL — AB (ref 4.0–10.5)

## 2014-12-04 LAB — PROLACTIN: PROLACTIN: 12.9 ng/mL

## 2014-12-04 LAB — TSH: TSH: 2.358 u[IU]/mL (ref 0.350–4.500)

## 2014-12-16 DIAGNOSIS — Z0271 Encounter for disability determination: Secondary | ICD-10-CM

## 2014-12-18 ENCOUNTER — Encounter: Payer: Self-pay | Admitting: Internal Medicine

## 2014-12-19 ENCOUNTER — Telehealth: Payer: Self-pay | Admitting: Internal Medicine

## 2014-12-19 NOTE — Telephone Encounter (Signed)
Notified patient that her FMLA paperwork is ready for pick up at the walk in center.Thank you Dr. Merla Riches for completing this so quickly.  Thanks, [employer]

## 2015-01-31 ENCOUNTER — Emergency Department (HOSPITAL_COMMUNITY)
Admission: EM | Admit: 2015-01-31 | Discharge: 2015-01-31 | Disposition: A | Discharge status: Home / Self Care | Payer: BLUE CROSS/BLUE SHIELD | Source: Home / Self Care | Attending: Family Medicine | Admitting: Family Medicine

## 2015-01-31 ENCOUNTER — Emergency Department (INDEPENDENT_AMBULATORY_CARE_PROVIDER_SITE_OTHER): Payer: BLUE CROSS/BLUE SHIELD

## 2015-01-31 ENCOUNTER — Encounter (HOSPITAL_COMMUNITY): Payer: Self-pay | Admitting: Emergency Medicine

## 2015-01-31 DIAGNOSIS — M25422 Effusion, left elbow: Secondary | ICD-10-CM

## 2015-01-31 DIAGNOSIS — M25522 Pain in left elbow: Secondary | ICD-10-CM

## 2015-01-31 NOTE — Discharge Instructions (Signed)
Thank you for coming in today. Use aleve as needed Follow up with Dr. Amanda Pea.  Return as needed.   Lateral Epicondylitis (Tennis Elbow) with Rehab Lateral epicondylitis involves inflammation and pain around the outer portion of the elbow. The pain is caused by inflammation of the tendons in the forearm that bring back (extend) the wrist. Lateral epicondylitis is also called tennis elbow, because it is very common in tennis players. However, it may occur in any individual who extends the wrist repetitively. If lateral epicondylitis is left untreated, it may become a chronic problem. SYMPTOMS   Pain, tenderness, and inflammation on the outer (lateral) side of the elbow.  Pain or weakness with gripping activities.  Pain that increases with wrist-twisting motions (playing tennis, using a screwdriver, opening a door or a jar).  Pain with lifting objects, including a coffee cup. CAUSES  Lateral epicondylitis is caused by inflammation of the tendons that extend the wrist. Causes of injury may include:  Repetitive stress and strain on the muscles and tendons that extend the wrist.  Sudden change in activity level or intensity.  Incorrect grip in racquet sports.  Incorrect grip size of racquet (often too large).  Incorrect hitting position or technique (usually backhand, leading with the elbow).  Using a racket that is too heavy. RISK INCREASES WITH:  Sports or occupations that require repetitive and/or strenuous forearm and wrist movements (tennis, squash, racquetball, carpentry).  Poor wrist and forearm strength and flexibility.  Failure to warm up properly before activity.  Resuming activity before healing, rehabilitation, and conditioning are complete. PREVENTION   Warm up and stretch properly before activity.  Maintain physical fitness:  Strength, flexibility, and endurance.  Cardiovascular fitness.  Wear and use properly fitted equipment.  Learn and use proper  technique and have a coach correct improper technique.  Wear a tennis elbow (counterforce) brace. PROGNOSIS  The course of this condition depends on the degree of the injury. If treated properly, acute cases (symptoms lasting less than 4 weeks) are often resolved in 2 to 6 weeks. Chronic (longer lasting cases) often resolve in 3 to 6 months but may require physical therapy. RELATED COMPLICATIONS   Frequently recurring symptoms, resulting in a chronic problem. Properly treating the problem the first time decreases frequency of recurrence.  Chronic inflammation, scarring tendon degeneration, and partial tendon tear, requiring surgery.  Delayed healing or resolution of symptoms. TREATMENT  Treatment first involves the use of ice and medicine to reduce pain and inflammation. Strengthening and stretching exercises may help reduce discomfort if performed regularly. These exercises may be performed at home if the condition is an acute injury. Chronic cases may require a referral to a physical therapist for evaluation and treatment. Your caregiver may advise a corticosteroid injection to help reduce inflammation. Rarely, surgery is needed. MEDICATION  If pain medicine is needed, nonsteroidal anti-inflammatory medicines (aspirin and ibuprofen), or other minor pain relievers (acetaminophen), are often advised.  Do not take pain medicine for 7 days before surgery.  Prescription pain relievers may be given, if your caregiver thinks they are needed. Use only as directed and only as much as you need.  Corticosteroid injections may be recommended. These injections should be reserved only for the most severe cases, because they can only be given a certain number of times. HEAT AND COLD  Cold treatment (icing) should be applied for 10 to 15 minutes every 2 to 3 hours for inflammation and pain, and immediately after activity that aggravates your symptoms. Use ice  packs or an ice massage.  Heat treatment may  be used before performing stretching and strengthening activities prescribed by your caregiver, physical therapist, or athletic trainer. Use a heat pack or a warm water soak. SEEK MEDICAL CARE IF: Symptoms get worse or do not improve in 2 weeks, despite treatment. EXERCISES  RANGE OF MOTION (ROM) AND STRETCHING EXERCISES - Epicondylitis, Lateral (Tennis Elbow) These exercises may help you when beginning to rehabilitate your injury. Your symptoms may go away with or without further involvement from your physician, physical therapist, or athletic trainer. While completing these exercises, remember:   Restoring tissue flexibility helps normal motion to return to the joints. This allows healthier, less painful movement and activity.  An effective stretch should be held for at least 30 seconds.  A stretch should never be painful. You should only feel a gentle lengthening or release in the stretched tissue. RANGE OF MOTION - Wrist Flexion, Active-Assisted  Extend your right / left elbow with your fingers pointing down.*  Gently pull the back of your hand towards you, until you feel a gentle stretch on the top of your forearm.  Hold this position for __________ seconds. Repeat __________ times. Complete this exercise __________ times per day.  *If directed by your physician, physical therapist or athletic trainer, complete this stretch with your elbow bent, rather than extended. RANGE OF MOTION - Wrist Extension, Active-Assisted  Extend your right / left elbow and turn your palm upwards.*  Gently pull your palm and fingertips back, so your wrist extends and your fingers point more toward the ground.  You should feel a gentle stretch on the inside of your forearm.  Hold this position for __________ seconds. Repeat __________ times. Complete this exercise __________ times per day. *If directed by your physician, physical therapist or athletic trainer, complete this stretch with your elbow bent,  rather than extended. STRETCH - Wrist Flexion  Place the back of your right / left hand on a tabletop, leaving your elbow slightly bent. Your fingers should point away from your body.  Gently press the back of your hand down onto the table by straightening your elbow. You should feel a stretch on the top of your forearm.  Hold this position for __________ seconds. Repeat __________ times. Complete this stretch __________ times per day.  STRETCH - Wrist Extension   Place your right / left fingertips on a tabletop, leaving your elbow slightly bent. Your fingers should point backwards.  Gently press your fingers and palm down onto the table by straightening your elbow. You should feel a stretch on the inside of your forearm.  Hold this position for __________ seconds. Repeat __________ times. Complete this stretch __________ times per day.  STRENGTHENING EXERCISES - Epicondylitis, Lateral (Tennis Elbow) These exercises may help you when beginning to rehabilitate your injury. They may resolve your symptoms with or without further involvement from your physician, physical therapist, or athletic trainer. While completing these exercises, remember:   Muscles can gain both the endurance and the strength needed for everyday activities through controlled exercises.  Complete these exercises as instructed by your physician, physical therapist or athletic trainer. Increase the resistance and repetitions only as guided.  You may experience muscle soreness or fatigue, but the pain or discomfort you are trying to eliminate should never worsen during these exercises. If this pain does get worse, stop and make sure you are following the directions exactly. If the pain is still present after adjustments, discontinue the exercise until you  can discuss the trouble with your caregiver. STRENGTH - Wrist Flexors  Sit with your right / left forearm palm-up and fully supported on a table or countertop. Your elbow  should be resting below the height of your shoulder. Allow your wrist to extend over the edge of the surface.  Loosely holding a __________ weight, or a piece of rubber exercise band or tubing, slowly curl your hand up toward your forearm.  Hold this position for __________ seconds. Slowly lower the wrist back to the starting position in a controlled manner. Repeat __________ times. Complete this exercise __________ times per day.  STRENGTH - Wrist Extensors  Sit with your right / left forearm palm-down and fully supported on a table or countertop. Your elbow should be resting below the height of your shoulder. Allow your wrist to extend over the edge of the surface.  Loosely holding a __________ weight, or a piece of rubber exercise band or tubing, slowly curl your hand up toward your forearm.  Hold this position for __________ seconds. Slowly lower the wrist back to the starting position in a controlled manner. Repeat __________ times. Complete this exercise __________ times per day.  STRENGTH - Ulnar Deviators  Stand with a ____________________ weight in your right / left hand, or sit while holding a rubber exercise band or tubing, with your healthy arm supported on a table or countertop.  Move your wrist, so that your pinkie travels toward your forearm and your thumb moves away from your forearm.  Hold this position for __________ seconds and then slowly lower the wrist back to the starting position. Repeat __________ times. Complete this exercise __________ times per day STRENGTH - Radial Deviators  Stand with a ____________________ weight in your right / left hand, or sit while holding a rubber exercise band or tubing, with your injured arm supported on a table or countertop.  Raise your hand upward in front of you or pull up on the rubber tubing.  Hold this position for __________ seconds and then slowly lower the wrist back to the starting position. Repeat __________ times.  Complete this exercise __________ times per day. STRENGTH - Forearm Supinators   Sit with your right / left forearm supported on a table, keeping your elbow below shoulder height. Rest your hand over the edge, palm down.  Gently grip a hammer or a soup ladle.  Without moving your elbow, slowly turn your palm and hand upward to a "thumbs-up" position.  Hold this position for __________ seconds. Slowly return to the starting position. Repeat __________ times. Complete this exercise __________ times per day.  STRENGTH - Forearm Pronators   Sit with your right / left forearm supported on a table, keeping your elbow below shoulder height. Rest your hand over the edge, palm up.  Gently grip a hammer or a soup ladle.  Without moving your elbow, slowly turn your palm and hand upward to a "thumbs-up" position.  Hold this position for __________ seconds. Slowly return to the starting position. Repeat __________ times. Complete this exercise __________ times per day.  STRENGTH - Grip  Grasp a tennis ball, a dense sponge, or a large, rolled sock in your hand.  Squeeze as hard as you can, without increasing any pain.  Hold this position for __________ seconds. Release your grip slowly. Repeat __________ times. Complete this exercise __________ times per day.  STRENGTH - Elbow Extensors, Isometric  Stand or sit upright, on a firm surface. Place your right / left arm so  that your palm faces your stomach, and it is at the height of your waist.  Place your opposite hand on the underside of your forearm. Gently push up as your right / left arm resists. Push as hard as you can with both arms, without causing any pain or movement at your right / left elbow. Hold this stationary position for __________ seconds. Gradually release the tension in both arms. Allow your muscles to relax completely before repeating. Document Released: 10/10/2005 Document Revised: 02/24/2014 Document Reviewed:  01/22/2009 [facility] Patient Information 2015 [place], [place]. This information is not intended to replace advice given to you by your health care provider. Make sure you discuss any questions you have with your health care provider.

## 2015-01-31 NOTE — ED Provider Notes (Signed)
[name] is a 39 y.o. female who presents to Urgent Care today for left elbow pain. Patient has a 5 day history of left-sided elbow pain. Symptoms have recurred without injury. She notes difficulty extending her supinating her arm fully due to pain. She has tried rest is elevation and NSAIDs which have not helped much. She denies any shoulder pain. She does have pain radiating to her wrist and tingling sensation into the second-fifth digits of her hand. She has no fevers chills vomiting or diarrhea.  Patient has a history of alopecia totalis. She has been on prednisone in the past and developed Cushing syndrome.    Past Medical History  Diagnosis Date  . Migraines   . Migraine   . Alopecia areata totalis   . Polycystic ovary disease   . GERD (gastroesophageal reflux disease)   . Hypertension   . Allergy   . Anxiety   . Alopecia   . Depression    Past Surgical History  Procedure Laterality Date  . Dilation and curettage of uterus    . Cholecystectomy    . Dnc     History  Substance Use Topics  . Smoking status: Never Smoker   . Smokeless tobacco: Not on file  . Alcohol Use: 0.0 oz/week    0 Standard drinks or equivalent per week     Comment: occasional 1   ROS as above Medications: No current facility-administered medications for this encounter.   Current Outpatient Prescriptions  Medication Sig Dispense Refill  . atenolol (TENORMIN) 25 MG tablet Take 0.5 tablets (12.5 mg total) by mouth daily. Takes 1/2 pill 45 tablet 3  . cetirizine (ZYRTEC) 10 MG tablet Take 10 mg by mouth daily.    . clonazePAM (KLONOPIN) 0.5 MG tablet Take 0.5 tablets (0.25 mg total) by mouth 2 (two) times daily as needed for anxiety. 60 tablet 5  . cyclobenzaprine (FLEXERIL) 10 MG tablet TAKE 1 TABLET (10 MG TOTAL) BY MOUTH AT BEDTIME. 30 tablet 4  . HYDROcodone-acetaminophen (NORCO) 5-325 MG per tablet Take 1 tablet by mouth every 6 (six) hours as needed for moderate pain. 30 tablet 0   . ondansetron (ZOFRAN) 4 MG tablet Take 1 tablet (4 mg total) by mouth every 8 (eight) hours as needed for nausea or vomiting. 20 tablet 5  . topiramate (TOPAMAX) 25 MG tablet One at hs for 1 week then 2 hs 60 tablet 3  . albuterol (PROVENTIL HFA;VENTOLIN HFA) 108 (90 BASE) MCG/ACT inhaler Inhale 2 puffs into the lungs every 6 (six) hours as needed for wheezing. 1 Inhaler 2  . AXERT 12.5 MG tablet TAKE 1 TAB AT ONSET OF H/A,MAY REPEAT IN 2 HRS IF NEEDED. MAX.2 COURSES PER WEEK,4 COURSES PER MONTH 8 tablet 0  . beclomethasone (QVAR) 80 MCG/ACT inhaler Inhale 1 puff into the lungs 2 (two) times daily. 1 Inhaler 12  . Biotin 2.5 MG TABS Take 2.5 mg by mouth daily.    Marland Kitchen eletriptan (RELPAX) 20 MG tablet One tablet by mouth at onset of headache. May repeat in 2 hours if headache persists or recurs. may repeat in 2 hours if necessary    . eletriptan (RELPAX) 40 MG tablet TAKE 1 TABLET BY MOUTH AT ONSET OF HEADACHE, MAY REPEAT IN 2 HOURS IF NEEDED 8 tablet 11  . Fe Fum-Vit C-Vit B12-FA (TRIGELS-F FORTE) 460-60-0.01-1 MG CAPS capsule TAKE 1 CAPSULE BY MOUTH DAILY. 30 capsule 11  . HYDROcodone-acetaminophen (NORCO) 5-325 MG per tablet Take 1 tablet  by mouth every 6 (six) hours as needed for moderate pain. 60d from signing date 30 tablet 0  . HYDROcodone-acetaminophen (NORCO/VICODIN) 5-325 MG per tablet Take 1 tablet by mouth every 6 (six) hours as needed. For 30d from signing date 30 tablet 0  . ipratropium (ATROVENT) 0.06 % nasal spray Place 2 sprays into the nose 3 (three) times daily. 15 mL 2  . ipratropium (ATROVENT) 0.06 % nasal spray Place 2 sprays into the nose 3 (three) times daily. 45 mL 1  . Iron, Ferrous Gluconate, 256 (28 FE) MG TABS Take 1 capsule by mouth 2 (two) times daily. 60 tablet 2  . medroxyPROGESTERone (PROVERA) 10 MG tablet Take 10 mg by mouth every 4 (four) months.    . Melatonin 1 MG CAPS Take 1 capsule by mouth at bedtime as needed.    . Multiple Vitamin (MULITIVITAMIN WITH  MINERALS) TABS Take 1 tablet by mouth daily.    Marland Kitchen omeprazole (PRILOSEC) 20 MG capsule Take 20 mg by mouth daily.    . pseudoephedrine-guaifenesin (MUCINEX D) 60-600 MG per tablet Take 1 tablet by mouth every 12 (twelve) hours.     Allergies  Allergen Reactions  . Darvocet [Propoxyphene N-Acetaminophen]   . Felodipine Er   . Imitrex [Sumatriptan Base]   . Prednisone Other (See Comments)    Severe reactions. Patient was on 60mg  and was put into full cushions disease (face swelling and humped back)  . Imitrex [Sumatriptan Succinate] Palpitations    Chest pain     Exam:  BP 146/93 mmHg  Pulse 91  Temp(Src) 97.9 F (36.6 C) (Oral)  Resp 18  SpO2 100%  LMP 01/19/2015 Gen: Well NAD HEENT: EOMI,  MMM Lungs: Normal work of breathing. CTABL Heart: RRR no MRG Abd: NABS, Soft. Nondistended, Nontender Exts: Brisk capillary refill, warm and well perfused.  Left shoulder nontender normal motion Elbow swollen and tender along the lateral aspect of the elbow. Range of motion 10-120 flexion. Supination lacks 5 of supination.. Tender palpation lateral upper condyle and radial head. Pain with resisted wrist extension. Wrist nontender normal motion Hand nontender normal motion. Pulses Refill sensation and grip strength intact.  Limited musculoskeletal ultrasound of the left lateral elbow. Letter epicondyles visualized. She has mild hypoechoic change along the insertion. No bony changes. She does not have changes characteristic of severe or significant lateral epicondylitis. She does however have a small effusion visible in the lateral part of the elbow. Medial epicondyles normal-appearing.  No results found for this or any previous visit (from the past 24 hour(s)). Dg Elbow Complete Left  01/31/2015   CLINICAL DATA:  Pain, no known injury, question lateral epicondylitis  EXAM: LEFT ELBOW - COMPLETE 3+ VIEW  COMPARISON:  None.  FINDINGS: Four views of the left elbow submitted. No acute  fracture or subluxation. There is small joint effusion.  IMPRESSION: No acute fracture or subluxation.  Small joint effusion.   Electronically Signed   By: 04/02/2015 M.D.   On: 01/31/2015 17:24    Assessment and Plan: 39 y.o. female with left elbow pain. The symptoms are somewhat consistent with lateral epicondylitis. However she does have an effusion which does not make a lot of sense for epicondylitis. She may have a rheumatologic process. She does have a history of alopecia totalis. She has had bad reactions to steroids in the past. Discussed options. Plan for trial of wrist splinting to reduce strain on the lateral upper condyle as well as NSAIDs. Follow-up with orthopedics if  not better. Interarticular loose body is a possibility. Return as needed.  Discussed warning signs or symptoms. Please see discharge instructions. Patient expresses understanding.     Rodolph Bong, MD 01/31/15 [phone]

## 2015-01-31 NOTE — ED Notes (Signed)
C/o left arm/elbow pain onset 3 days; radiates to hand Denies inj/trauma; limited ROM; hurts when she extends arm out Sx also include numbness/tingly Alert, no signs of acute distress.

## 2015-05-08 ENCOUNTER — Ambulatory Visit (INDEPENDENT_AMBULATORY_CARE_PROVIDER_SITE_OTHER): Payer: BLUE CROSS/BLUE SHIELD | Admitting: Internal Medicine

## 2015-05-08 VITALS — BP 124/78 | HR 103 | Temp 99.0°F | Resp 18 | Ht 63.0 in | Wt 209.0 lb

## 2015-05-08 DIAGNOSIS — Z23 Encounter for immunization: Secondary | ICD-10-CM | POA: Diagnosis not present

## 2015-05-08 DIAGNOSIS — R7989 Other specified abnormal findings of blood chemistry: Secondary | ICD-10-CM

## 2015-05-08 DIAGNOSIS — G43101 Migraine with aura, not intractable, with status migrainosus: Secondary | ICD-10-CM

## 2015-05-08 DIAGNOSIS — E790 Hyperuricemia without signs of inflammatory arthritis and tophaceous disease: Secondary | ICD-10-CM

## 2015-05-08 DIAGNOSIS — M359 Systemic involvement of connective tissue, unspecified: Secondary | ICD-10-CM

## 2015-05-08 MED ORDER — HYDROCODONE-ACETAMINOPHEN 5-325 MG PO TABS: 1.0000 | ORAL_TABLET | Freq: Four times a day (QID) | ORAL | Status: DC | PRN

## 2015-05-08 MED ORDER — AMOXICILLIN 500 MG PO CAPS: 1000.0000 mg | ORAL_CAPSULE | Freq: Two times a day (BID) | ORAL | Status: AC

## 2015-05-08 NOTE — Progress Notes (Addendum)
Subjective:    [name], female    DOB: 1976/09/27, 39 y.o.   MRN: [id number] This chart was scribed for Mary Lin, MD by [name], Medical Scribe. This patient was seen in Room 12 and the patient's care was started at 3:53 PM.   HPI HPI Comments: [name] is a 39 y.o. female with a history of PCOS, migraine headaches and alopecia areata totalis who presents [facility] the [facility] complaining of gradual onset sinusitis symptoms that started 5 days ago. Patient notes her symptoms started with a migraine headache. The following day, she began sneezing and also began having postnasal drip. At that point, she began taking Zyrtec. As of 3 days ago, she reports having some voice loss. The next day, she began having productive cough of green sputum and rhinorrhea of green sputum. Her symptoms overall have been worsening gradually. She has had some difficulty sleeping due to her symptoms. She has also tried Sudafed, Benadryl and saline spray. Patient has missed some days of work because of her illness. She has not been on any recent antibiotics.  Patient was seen at the [facility] 3 months ago for left elbow pain with numbness to her 2nd-5th fingers. This had started after waking up with elbow swelling and warmth, but without redness. She had very limited ROM. Exercise and RICE protocol were ineffective treatments. She had been unable to grasp with her left hand due to the numbness, which prompted her to be seen at the urgent care. At the visit, she was referred to Dr. Caralyn Randolph, who had sent her to get an MRI. She was told her symptoms were due to swelling in the joint and had fluid drained from her elbow. She then returned to Dr. 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 She has questions for me about this therapy as it scares  her.   Patient has been going to acupuncture which has helped her regain ROM in her elbow. She denies any illnesses or new medications in the month prior to onset of elbow pain. Patient has allergies to prednisone, which causes Cushing's syndrome.  Patient has been using essential oils which has helped with her PCOS. She has been having normal menstrual periods since September, 2015. She has also regained some hair growth on her scalp and armpits.   Patient Active Problem List   Diagnosis Date Noted  . Anemia 01/01/2014  . Insomnia 01/04/2013  . Polycystic ovaries 03/25/2012  . Hyperglycemia 03/25/2012  . Adult body mass index 37.0-37.9 03/25/2012  . Cushing's syndrome-iatrogenic 03/25/2012  . 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 Hyperprolactinemia 03/25/2012  . Alopecia areata totalis--- she has noted a few new hair son her scalp and some underarm hair in the last 6 months  03/25/2012  . Allergic rhinitis due to pollen 03/25/2012  . GERD (gastroesophageal reflux disease)---s table on meds  03/25/2012  . Migraine headache 03/25/2012    Review of Systems  Constitutional: Negative for fever, chills and unexpected weight change.  HENT: Negative for trouble swallowing.   Respiratory: Negative for shortness of breath and wheezing.   Cardiovascular: Negative for chest pain, palpitations and leg swelling.  Gastrointestinal: Negative for abdominal pain.  Genitourinary: Negative for difficulty urinating.  Skin: Negative for rash.  Neurological:       Headaches have been manageable  Objective:   Physical Exam  Constitutional: She is oriented to person, place, and time. She appears well-developed and well-nourished. No distress.  HENT:  Head: Normocephalic and atraumatic.  Right Ear: External ear normal.  Left Ear: External ear normal.  Mouth/Throat: Oropharynx is clear and moist. No oropharyngeal exudate.  Purulent nasal discharge with tender  left maxillary area to percussion  Eyes: Conjunctivae are normal. Pupils are equal, round, and reactive to light.  Neck: Neck supple. No thyromegaly present.  Cardiovascular: Normal rate, regular rhythm and normal heart sounds.   Pulmonary/Chest: Effort normal and breath sounds normal. She has no wheezes.  Musculoskeletal: She exhibits no edema.  Lymphadenopathy:    She has no cervical adenopathy.  Neurological: She is alert and oriented to person, place, and time. No cranial nerve deficit.  Skin: Skin is warm and dry. No rash noted.  Psychiatric: She has a normal mood and affect. Her behavior is normal.  Vitals reviewed. 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 amoxicillin (AMOXIL) 500 MG capsule    Sig: Take 2 capsules (1,000 mg total) by mouth 2 (two) times daily.    Dispense:  40 capsule    Refill:  0  . HYDROcodone-acetaminophen (NORCO) 5-325 MG per tablet    Sig: Take 1 tablet by mouth every 6 (six) hours as needed for moderate pain.    Dispense:  30 tablet    Refill:  0  . HYDROcodone-acetaminophen (NORCO/VICODIN) 5-325 MG per tablet    Sig: Take 1 tablet by mouth every 6 (six) hours as needed. For 30d from signing date    Dispense:  30 tablet    Refill:  0   Follow-up for labs as scheduled I have completed the patient encounter in its entirety as documented by the scribe, with editing by me where  necessary. [name] P. Laney Pastor, M.D.  Addendum-labs from Dr [name] 04/10/15 cmet--glu 141 not fast CBC--wbc 12.9 Esr 28 Uric acid 7.7(no history of ever having gout) Ace,rf,ana,ccp,anti-dna all neg

## 2015-05-10 DIAGNOSIS — E790 Hyperuricemia without signs of inflammatory arthritis and tophaceous disease: Secondary | ICD-10-CM | POA: Insufficient documentation

## 2015-06-16 ENCOUNTER — Other Ambulatory Visit: Payer: Self-pay | Admitting: Internal Medicine

## 2015-07-05 ENCOUNTER — Other Ambulatory Visit: Payer: Self-pay | Admitting: Internal Medicine

## 2015-07-08 ENCOUNTER — Encounter: Payer: Self-pay | Admitting: Internal Medicine

## 2015-07-08 ENCOUNTER — Ambulatory Visit (INDEPENDENT_AMBULATORY_CARE_PROVIDER_SITE_OTHER): Payer: BLUE CROSS/BLUE SHIELD | Admitting: Internal Medicine

## 2015-07-08 VITALS — BP 114/80 | HR 86 | Temp 98.4°F | Resp 16 | Ht 63.0 in | Wt 207.0 lb

## 2015-07-08 DIAGNOSIS — E790 Hyperuricemia without signs of inflammatory arthritis and tophaceous disease: Secondary | ICD-10-CM

## 2015-07-08 DIAGNOSIS — Z114 Encounter for screening for human immunodeficiency virus [HIV]: Secondary | ICD-10-CM | POA: Diagnosis not present

## 2015-07-08 DIAGNOSIS — R739 Hyperglycemia, unspecified: Secondary | ICD-10-CM

## 2015-07-08 DIAGNOSIS — G4700 Insomnia, unspecified: Secondary | ICD-10-CM

## 2015-07-08 DIAGNOSIS — G43101 Migraine with aura, not intractable, with status migrainosus: Secondary | ICD-10-CM | POA: Diagnosis not present

## 2015-07-08 DIAGNOSIS — Z23 Encounter for immunization: Secondary | ICD-10-CM

## 2015-07-08 DIAGNOSIS — D509 Iron deficiency anemia, unspecified: Secondary | ICD-10-CM | POA: Diagnosis not present

## 2015-07-08 LAB — CBC WITH DIFFERENTIAL/PLATELET
BASOS PCT: 0 % (ref 0–1)
Basophils Absolute: 0 10*3/uL (ref 0.0–0.1)
Eosinophils Absolute: 0.3 10*3/uL (ref 0.0–0.7)
Eosinophils Relative: 2 % (ref 0–5)
HCT: 44.8 % (ref 36.0–46.0)
HEMOGLOBIN: 15.3 g/dL — AB (ref 12.0–15.0)
LYMPHS ABS: 3.1 10*3/uL (ref 0.7–4.0)
LYMPHS PCT: 23 % (ref 12–46)
MCH: 28.7 pg (ref 26.0–34.0)
MCHC: 34.2 g/dL (ref 30.0–36.0)
MCV: 84.1 fL (ref 78.0–100.0)
MONO ABS: 0.7 10*3/uL (ref 0.1–1.0)
MPV: 10.2 fL (ref 8.6–12.4)
Monocytes Relative: 5 % (ref 3–12)
NEUTROS ABS: 9.3 10*3/uL — AB (ref 1.7–7.7)
NEUTROS PCT: 70 % (ref 43–77)
Platelets: 451 10*3/uL — ABNORMAL HIGH (ref 150–400)
RBC: 5.33 MIL/uL — ABNORMAL HIGH (ref 3.87–5.11)
RDW: 12.9 % (ref 11.5–15.5)
WBC: 13.3 10*3/uL — ABNORMAL HIGH (ref 4.0–10.5)

## 2015-07-08 LAB — URIC ACID: Uric Acid, Serum: 7.8 mg/dL — ABNORMAL HIGH (ref 2.4–7.0)

## 2015-07-08 LAB — POCT GLYCOSYLATED HEMOGLOBIN (HGB A1C): HEMOGLOBIN A1C: 6.7

## 2015-07-08 MED ORDER — CYCLOBENZAPRINE HCL 10 MG PO TABS: ORAL_TABLET | ORAL | Status: DC

## 2015-07-08 MED ORDER — HYDROCODONE-ACETAMINOPHEN 5-325 MG PO TABS: 1.0000 | ORAL_TABLET | Freq: Four times a day (QID) | ORAL | Status: DC | PRN

## 2015-07-08 MED ORDER — ATENOLOL 25 MG PO TABS: 12.5000 mg | ORAL_TABLET | Freq: Every day | ORAL | Status: DC

## 2015-07-08 MED ORDER — IRON (FERROUS GLUCONATE) 256 (28 FE) MG PO TABS: 1.0000 | ORAL_TABLET | Freq: Two times a day (BID) | ORAL | Status: DC

## 2015-07-08 MED ORDER — TOPIRAMATE 25 MG PO TABS: ORAL_TABLET | ORAL | Status: DC

## 2015-07-08 MED ORDER — ONDANSETRON HCL 4 MG PO TABS: 4.0000 mg | ORAL_TABLET | Freq: Three times a day (TID) | ORAL | Status: DC | PRN

## 2015-07-08 MED ORDER — ELETRIPTAN HYDROBROMIDE 40 MG PO TABS: ORAL_TABLET | ORAL | Status: DC

## 2015-07-08 MED ORDER — CLONAZEPAM 0.5 MG PO TABS: 0.2500 mg | ORAL_TABLET | Freq: Two times a day (BID) | ORAL | Status: DC | PRN

## 2015-07-08 NOTE — Progress Notes (Signed)
Subjective:    [name], female    DOB: 1976/06/14, 39 y.o.   MRN: [id number]  HPI follow-up primarily for headache disorder She has improved since the addition of Topamax Decrease frequency and decreased intensity topamax has helped--many fewer bad days Larger moniter at work helps More energy Sleeping better  I She continues to have joint disorder currently by rheumatology to be a seronegative arthritis Elbows knees ankles primary joints She needs G6PD testing to see if she qualifies for treatment with sulfasalazine   Patient Active Problem List   Diagnosis Date Noted  . Elevated uric acid in blood 05/10/2015  . Anemia 01/01/2014  . Insomnia 01/04/2013  . Polycystic ovaries 03/25/2012  . Hyperglycemia 03/25/2012  . Adult body mass index 37.0-37.9 03/25/2012  . Cushing's syndrome-iatrogenic 03/25/2012  . 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 Hyperprolactinemia 03/25/2012  . 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 Allergic rhinitis due to pollen 03/25/2012  . GERD (gastroesophageal reflux disease) 03/25/2012  . Migraine headache 03/25/2012    Review of Systems There are no new points of concern in her review of systems Job going well Very little return of hair at this point No weight changes or night sweats Despite elevated uric acid she's had no episodes of gout Note past labs where hyperglycemia with borderline   sleeps well with Klonopin Objective:   Physical Exam  Constitutional: She is oriented to person, place, and time. She appears well-developed and well-nourished. No distress.  HENT:  Head: Normocephalic and atraumatic.  Eyes: Pupils are equal, round, and reactive to light.  Neck: Normal range of motion.  Cardiovascular: Normal rate and regular rhythm.   Pulmonary/Chest: Effort normal. No respiratory distress.  Musculoskeletal: Normal range of motion.  No joint swelling or redness  Neurological:  She is alert and oriented to person, place, and time. No cranial nerve deficit. She exhibits normal muscle tone. Coordination normal.  Skin: Skin is warm and dry.  Minimal new hair growth    Psychiatric: She has a normal mood and affect. Her behavior is normal.  Nursing note and vitals reviewed.  Wt Readings from Last 3 Encounters:  07/08/15 207 lb (93.895 kg)  05/08/15 209 lb (94.802 kg)  12/03/14 208 lb 9.6 oz (94.62 kg)  BP 114/80 mmHg  Pulse 86  Temp(Src) 98.4 F (36.9 C)  Resp 16  Ht 5\' 3"  (1.6 m)  Wt 207 lb (93.895 kg)  BMI 36.68 kg/m2  BP 114/80 mmHg  Pulse 86  Temp(Src) 98.4 F (36.9 C)  Resp 16  Ht 5\' 3"  (1.6 m)  Wt 207 lb (93.895 kg)  BMI 36.68 kg/m2        Assessment & Plan:  Need for prophylactic vaccination and inoculation against influenza - Plan: Flu Vaccine QUAD 36+ mos IM  Insomnia - Plan: clonazePAM (KLONOPIN) 0.5 MG tablet  Iron deficiency anemia - Plan: CBC with Differential/Platelet  Migraine with aura and with status migrainosus, not intractable - Plan: atenolol (TENORMIN) 25 MG tablet, HYDROcodone-acetaminophen (NORCO) 5-325 MG per tablet, HYDROcodone-acetaminophen (NORCO/VICODIN) 5-325 MG per tablet, HYDROcodone-acetaminophen (NORCO) 5-325 MG per tablet  Hyperuricemia - Plan: Glucose 6 phosphate dehydrogenase, Uric Acid  Hyperglycemia - Plan: POCT glycosylated hemoglobin (Hb A1C)  Screening for HIV (human immunodeficiency virus) - Plan: HIV antibody  Meds ordered this encounter  Medications  . clonazePAM (KLONOPIN) 0.5 MG tablet    Sig: Take 0.5 tablets (0.25 mg total) by mouth 2 (two) times daily as needed  for anxiety.    Dispense:  60 tablet    Refill:  5  . atenolol (TENORMIN) 25 MG tablet    Sig: Take 0.5 tablets (12.5 mg total) by mouth daily. Takes 1/2 pill    Dispense:  45 tablet    Refill:  3  . topiramate (TOPAMAX) 25 MG tablet    Sig: TAKE 1-2 TABLETs BY MOUTH AT BEDTIME    Dispense:  60 tablet    Refill:  5  .  ondansetron (ZOFRAN) 4 MG tablet    Sig: Take 1 tablet (4 mg total) by mouth every 8 (eight) hours as needed for nausea or vomiting.    Dispense:  20 tablet    Refill:  5  . Iron, Ferrous Gluconate, 256 (28 FE) MG TABS    Sig: Take 1 capsule by mouth 2 (two) times daily.    Dispense:  60 tablet    Refill:  2  . eletriptan (RELPAX) 40 MG tablet    Sig: TAKE 1 TABLET BY MOUTH AT ONSET OF HEADACHE, MAY REPEAT IN 2 HOURS IF NEEDED    Dispense:  8 tablet    Refill:  11  . cyclobenzaprine (FLEXERIL) 10 MG tablet    Sig: TAKE 1 TABLET (10 MG TOTAL) BY MOUTH AT BEDTIME    Dispense:  30 tablet    Refill:  5  . HYDROcodone-acetaminophen (NORCO) 5-325 MG per tablet    Sig: Take 1 tablet by mouth every 6 (six) hours as needed for moderate pain.    Dispense:  30 tablet    Refill:  0  . HYDROcodone-acetaminophen (NORCO/VICODIN) 5-325 MG per tablet    Sig: Take 1 tablet by mouth every 6 (six) hours as needed. For 30d from signing date    Dispense:  30 tablet    Refill:  0  . HYDROcodone-acetaminophen (NORCO) 5-325 MG per tablet    Sig: Take 1 tablet by mouth every 6 (six) hours as needed for moderate pain. 60d from signing date    Dispense:  30 tablet    Refill:  0

## 2015-07-09 LAB — GLUCOSE 6 PHOSPHATE DEHYDROGENASE: G-6PDH: 13.6 U/g Hgb (ref 7.0–20.5)

## 2015-07-09 LAB — HIV ANTIBODY (ROUTINE TESTING W REFLEX): HIV 1&2 Ab, 4th Generation: NONREACTIVE

## 2015-07-23 ENCOUNTER — Encounter: Payer: Self-pay | Admitting: Internal Medicine

## 2015-07-27 ENCOUNTER — Encounter: Payer: Self-pay | Admitting: Internal Medicine

## 2015-08-17 ENCOUNTER — Other Ambulatory Visit: Payer: Self-pay | Admitting: Internal Medicine

## 2015-09-10 ENCOUNTER — Ambulatory Visit (INDEPENDENT_AMBULATORY_CARE_PROVIDER_SITE_OTHER): Payer: BLUE CROSS/BLUE SHIELD | Admitting: Family Medicine

## 2015-09-10 VITALS — BP 128/80 | HR 91 | Temp 98.5°F | Resp 16 | Ht 62.0 in | Wt 205.0 lb

## 2015-09-10 DIAGNOSIS — Z79899 Other long term (current) drug therapy: Secondary | ICD-10-CM | POA: Diagnosis not present

## 2015-09-10 NOTE — Progress Notes (Signed)
This chart was scribed for Elvina Sidle, MD by Stann Ore, medical scribe at [facility].The patient was seen in exam room 1 and the patient's care was started at 6:48 PM.  [name] MRN: [id number], DOB: 28-Oct-1975, 39 y.o. Date of Encounter: 09/10/2015  Primary Physician: [name], MD  Chief Complaint:  Chief Complaint  Patient presents with   lab    blood work , liver and kidneys     HPI:  See [name] is a 39 y.o. female who presents to [facility] for blood work done. She went to Dr. Corliss Skains for rheumatology but they've lost her blood work multiple times now.   She needs the lab work done to monitor her liver and kidney functions due to starting sulfasalazine.   Past Medical History  Diagnosis Date   Migraines    Migraine    Alopecia areata totalis    Polycystic ovary disease    GERD (gastroesophageal reflux disease)    Hypertension    Allergy    Anxiety    Alopecia    Depression      Home Meds: Prior to Admission medications   Medication Sig Start Date End Date Taking? Authorizing Provider  atenolol (TENORMIN) 25 MG tablet Take 0.5 tablets (12.5 mg total) by mouth daily. Takes 1/2 pill 07/08/15  Yes [name], MD  Bepotastine Besilate (BEPREVE) 1.5 % SOLN    Yes Historical Provider, MD  Biotin 2.5 MG TABS Take 2.5 mg by mouth daily.   Yes Historical Provider, MD  cetirizine (ZYRTEC) 10 MG tablet Take 10 mg by mouth daily.   Yes Historical Provider, MD  clonazePAM (KLONOPIN) 0.5 MG tablet Take 0.5 tablets (0.25 mg total) by mouth 2 (two) times daily as needed for anxiety. 07/08/15  Yes [name], MD  cyclobenzaprine (FLEXERIL) 10 MG tablet TAKE 1 TABLET (10 MG TOTAL) BY MOUTH AT BEDTIME 07/08/15  Yes [name], MD  eletriptan (RELPAX) 40 MG tablet TAKE 1 TABLET BY MOUTH AT ONSET OF HEADACHE, MAY REPEAT IN 2 HOURS IF NEEDED 07/08/15  Yes  [name], MD  Fe Fum-Vit C-Vit B12-FA (TRIGELS-F FORTE) 460-60-0.01-1 MG CAPS capsule TAKE 1 CAPSULE BY MOUTH DAILY 08/18/15  Yes [name], MD  HYDROcodone-acetaminophen (NORCO) 5-325 MG per tablet Take 1 tablet by mouth every 6 (six) hours as needed for moderate pain. 07/08/15  Yes [name], MD  HYDROcodone-acetaminophen (NORCO) 5-325 MG per tablet Take 1 tablet by mouth every 6 (six) hours as needed for moderate pain. 60d from signing date 07/08/15  Yes [name], MD  HYDROcodone-acetaminophen (NORCO/VICODIN) 5-325 MG per tablet Take 1 tablet by mouth every 6 (six) hours as needed. For 30d from signing date 07/08/15  Yes [name], MD  Melatonin 1 MG CAPS Take 1 capsule by mouth at bedtime as needed.   Yes Historical Provider, MD  Multiple Vitamin (MULITIVITAMIN WITH MINERALS) TABS Take 1 tablet by mouth daily.   Yes Historical Provider, MD  omeprazole (PRILOSEC) 20 MG capsule Take 20 mg by mouth daily.   Yes Historical Provider, MD  ondansetron (ZOFRAN) 4 MG tablet Take 1 tablet (4 mg total) by mouth every 8 (eight) hours as needed for nausea or vomiting. 07/08/15  Yes [name], MD  pseudoephedrine-guaifenesin [name] D) 60-600 MG per tablet Take 1 tablet by mouth every 12 (twelve) hours.   Yes Historical Provider,  MD  sulfaSALAzine (AZULFIDINE) 500 MG tablet Take 1,000 mg by mouth 2 (two) times daily.   Yes Historical Provider, MD  topiramate (TOPAMAX) 25 MG tablet TAKE 1-2 TABLETs BY MOUTH AT BEDTIME 07/08/15  Yes [name], MD  Iron, Ferrous Gluconate, 256 (28 FE) MG TABS Take 1 capsule by mouth 2 (two) times daily. Patient not taking: Reported on 09/10/2015 07/08/15   [name], MD    Allergies:  Allergies  Allergen Reactions   Darvocet [Propoxyphene N-Acetaminophen]    Felodipine Er    Imitrex [Sumatriptan Base]    Prednisone Other (See Comments)    Severe reactions. Patient was on 60mg  and was put into full  cushions disease (face swelling and humped back)   Imitrex [Sumatriptan Succinate] Palpitations    Chest pain    Social History   Social History   Marital Status: Married    Spouse Name: N/A   Number of Children: N/A   Years of Education: N/A   Occupational History   in Optometrist    Social History Main Topics   Smoking status: Never Smoker    Smokeless tobacco: Never Used   Alcohol Use: 0.0 oz/week    0 Standard drinks or equivalent per week     Comment: occasional 1   Drug Use: No   Sexual Activity: Yes   Other Topics Concern   Not on file   Social History Narrative   ** Merged History Encounter **    Married. Education: Print production planner. Exercise: Yes.     Review of Systems: Constitutional: negative for fever, chills, night sweats, weight changes, or fatigue  HEENT: negative for vision changes, hearing loss, congestion, rhinorrhea, ST, epistaxis, or sinus pressure Cardiovascular: negative for chest pain or palpitations Respiratory: negative for hemoptysis, wheezing, shortness of breath, or cough Abdominal: negative for abdominal pain, nausea, vomiting, diarrhea, or constipation Dermatological: negative for rash Neurologic: negative for headache, dizziness, or syncope All other systems reviewed and are otherwise negative with the exception to those above and in the HPI.  Physical Exam: Blood pressure 128/80, pulse 91, temperature 98.5 F (36.9 C), temperature source Oral, resp. rate 16, height 5\' 2"  (1.575 m), weight 205 lb (92.987 kg), SpO2 98 %., Body mass index is 37.49 kg/(m^2). General: Well developed, well nourished, in no acute distress. Head: Normocephalic, atraumatic, eyes without discharge, sclera non-icteric, nares are without discharge. Bilateral auditory canals clear, TM's are without perforation, pearly grey and translucent with reflective cone of light bilaterally. Oral cavity moist, posterior pharynx without exudate, erythema,  peritonsillar abscess, or post nasal drip. Neck: Supple. No thyromegaly. Full ROM. No lymphadenopathy. Msk:  Strength and tone normal for age. Extremities/Skin: Warm and dry. No clubbing or cyanosis. No edema. No rashes or suspicious lesions. Neuro: Alert and oriented X 3. Moves all extremities spontaneously. Gait is normal. CNII-XII grossly in tact. Psych:  Responds to questions appropriately with a normal affect.     ASSESSMENT AND PLAN:  39 y.o. year old female with  This chart was scribed in my presence and reviewed by me personally.    ICD-9-CM ICD-10-CM   1. Encounter for long-term (current) use of medications V58.69 Z79.899 COMPLETE METABOLIC PANEL WITH GFR     CBC with Differential/Platelet   Send labs to Dr. .  Needs these labs repeated q month x 2, then q3 months  By signing my name below, I, 40, attest that this documentation has been prepared under the direction and in the  presence of Elvina Sidle, MD. Electronically Signed: Stann Ore, Scribe. 09/10/2015 , 6:48 PM .  Signed, Elvina Sidle, MD 09/10/2015 6:48 PM

## 2015-09-11 LAB — COMPLETE METABOLIC PANEL WITH GFR
ALT: 21 U/L (ref 6–29)
AST: 20 U/L (ref 10–30)
Albumin: 4.7 g/dL (ref 3.6–5.1)
Alkaline Phosphatase: 99 U/L (ref 33–115)
BUN: 7 mg/dL (ref 7–25)
CO2: 24 mmol/L (ref 20–31)
Calcium: 9.7 mg/dL (ref 8.6–10.2)
Chloride: 103 mmol/L (ref 98–110)
Creat: 0.66 mg/dL (ref 0.50–1.10)
GFR, Est African American: >89 mL/min (ref >=60)
GFR, Est Non African American: >89 mL/min (ref >=60)
Glucose, Bld: 86 mg/dL (ref 65–99)
Potassium: 4.6 mmol/L (ref 3.5–5.3)
Sodium: 139 mmol/L (ref 135–146)
Total Bilirubin: 0.4 mg/dL (ref 0.2–1.2)
Total Protein: 7.3 g/dL (ref 6.1–8.1)

## 2015-09-11 LAB — CBC WITH DIFFERENTIAL/PLATELET
Basophils Absolute: 0 10*3/uL (ref 0.0–0.1)
Basophils Relative: 0 % (ref 0–1)
Eosinophils Absolute: 0.2 10*3/uL (ref 0.0–0.7)
Eosinophils Relative: 2 % (ref 0–5)
HCT: 44 % (ref 36.0–46.0)
Hemoglobin: 14.6 g/dL (ref 12.0–15.0)
Lymphocytes Relative: 30 % (ref 12–46)
Lymphs Abs: 3.2 10*3/uL (ref 0.7–4.0)
MCH: 28.3 pg (ref 26.0–34.0)
MCHC: 33.2 g/dL (ref 30.0–36.0)
MCV: 85.3 fL (ref 78.0–100.0)
MPV: 9.8 fL (ref 8.6–12.4)
Monocytes Absolute: 0.6 10*3/uL (ref 0.1–1.0)
Monocytes Relative: 6 % (ref 3–12)
Neutro Abs: 6.7 10*3/uL (ref 1.7–7.7)
Neutrophils Relative %: 62 % (ref 43–77)
Platelets: 459 10*3/uL — ABNORMAL HIGH (ref 150–400)
RBC: 5.16 MIL/uL — ABNORMAL HIGH (ref 3.87–5.11)
RDW: 13.9 % (ref 11.5–15.5)
WBC: 10.8 10*3/uL — ABNORMAL HIGH (ref 4.0–10.5)

## 2015-09-21 ENCOUNTER — Encounter: Payer: Self-pay | Admitting: Internal Medicine

## 2015-10-08 ENCOUNTER — Other Ambulatory Visit: Payer: Self-pay | Admitting: Internal Medicine

## 2015-10-08 DIAGNOSIS — G43101 Migraine with aura, not intractable, with status migrainosus: Secondary | ICD-10-CM

## 2015-10-09 MED ORDER — HYDROCODONE-ACETAMINOPHEN 5-325 MG PO TABS: 1.0000 | ORAL_TABLET | Freq: Four times a day (QID) | ORAL | Status: DC | PRN

## 2015-10-09 NOTE — Telephone Encounter (Signed)
Meds ordered this encounter  Medications  . HYDROcodone-acetaminophen (NORCO) 5-325 MG tablet    Sig: Take 1 tablet by mouth every 6 (six) hours as needed for moderate pain.    Dispense:  30 tablet    Refill:  0  . HYDROcodone-acetaminophen (NORCO) 5-325 MG tablet    Sig: Take 1 tablet by mouth every 6 (six) hours as needed for moderate pain. 60d from signing date    Dispense:  30 tablet    Refill:  0  . HYDROcodone-acetaminophen (NORCO/VICODIN) 5-325 MG tablet    Sig: Take 1 tablet by mouth every 6 (six) hours as needed. For 30d from signing date    Dispense:  30 tablet    Refill:  0

## 2015-10-11 NOTE — Telephone Encounter (Signed)
LMOM that rx is ready for pickup at 102

## 2015-11-19 ENCOUNTER — Other Ambulatory Visit (INDEPENDENT_AMBULATORY_CARE_PROVIDER_SITE_OTHER): Payer: BLUE CROSS/BLUE SHIELD | Admitting: *Deleted

## 2015-11-19 DIAGNOSIS — Z79899 Other long term (current) drug therapy: Secondary | ICD-10-CM | POA: Diagnosis not present

## 2015-11-19 NOTE — Progress Notes (Signed)
Lab visit only. 

## 2015-11-20 LAB — CBC WITH DIFFERENTIAL/PLATELET
Basophils Absolute: 0 10*3/uL (ref 0.0–0.1)
Basophils Relative: 0 % (ref 0–1)
Eosinophils Absolute: 0.2 10*3/uL (ref 0.0–0.7)
Eosinophils Relative: 2 % (ref 0–5)
HCT: 42.3 % (ref 36.0–46.0)
Hemoglobin: 14.5 g/dL (ref 12.0–15.0)
Lymphocytes Relative: 30 % (ref 12–46)
Lymphs Abs: 3.5 10*3/uL (ref 0.7–4.0)
MCH: 29.3 pg (ref 26.0–34.0)
MCHC: 34.3 g/dL (ref 30.0–36.0)
MCV: 85.5 fL (ref 78.0–100.0)
MPV: 9.7 fL (ref 8.6–12.4)
Monocytes Absolute: 0.6 10*3/uL (ref 0.1–1.0)
Monocytes Relative: 5 % (ref 3–12)
Neutro Abs: 7.2 10*3/uL (ref 1.7–7.7)
Neutrophils Relative %: 63 % (ref 43–77)
Platelets: 446 10*3/uL — ABNORMAL HIGH (ref 150–400)
RBC: 4.95 MIL/uL (ref 3.87–5.11)
RDW: 13.5 % (ref 11.5–15.5)
WBC: 11.5 10*3/uL — ABNORMAL HIGH (ref 4.0–10.5)

## 2015-11-20 LAB — COMPREHENSIVE METABOLIC PANEL
ALT: 23 U/L (ref 6–29)
AST: 18 U/L (ref 10–30)
Albumin: 4.3 g/dL (ref 3.6–5.1)
Alkaline Phosphatase: 88 U/L (ref 33–115)
BUN: 10 mg/dL (ref 7–25)
CO2: 24 mmol/L (ref 20–31)
Calcium: 9.7 mg/dL (ref 8.6–10.2)
Chloride: 103 mmol/L (ref 98–110)
Creat: 0.63 mg/dL (ref 0.50–1.10)
Glucose, Bld: 96 mg/dL (ref 65–99)
Potassium: 4.3 mmol/L (ref 3.5–5.3)
Sodium: 140 mmol/L (ref 135–146)
Total Bilirubin: 0.5 mg/dL (ref 0.2–1.2)
Total Protein: 7 g/dL (ref 6.1–8.1)

## 2015-12-09 ENCOUNTER — Encounter: Payer: Self-pay | Admitting: Internal Medicine

## 2015-12-09 ENCOUNTER — Ambulatory Visit (INDEPENDENT_AMBULATORY_CARE_PROVIDER_SITE_OTHER): Payer: BLUE CROSS/BLUE SHIELD | Admitting: Internal Medicine

## 2015-12-09 VITALS — BP 129/88 | HR 93 | Temp 98.1°F | Resp 16 | Ht 62.0 in | Wt 207.0 lb

## 2015-12-09 DIAGNOSIS — D649 Anemia, unspecified: Secondary | ICD-10-CM

## 2015-12-09 DIAGNOSIS — M359 Systemic involvement of connective tissue, unspecified: Secondary | ICD-10-CM

## 2015-12-09 DIAGNOSIS — G4700 Insomnia, unspecified: Secondary | ICD-10-CM

## 2015-12-09 DIAGNOSIS — L630 Alopecia (capitis) totalis: Secondary | ICD-10-CM

## 2015-12-09 DIAGNOSIS — G43101 Migraine with aura, not intractable, with status migrainosus: Secondary | ICD-10-CM

## 2015-12-09 DIAGNOSIS — E221 Hyperprolactinemia: Secondary | ICD-10-CM | POA: Diagnosis not present

## 2015-12-09 DIAGNOSIS — E790 Hyperuricemia without signs of inflammatory arthritis and tophaceous disease: Secondary | ICD-10-CM

## 2015-12-09 DIAGNOSIS — D72829 Elevated white blood cell count, unspecified: Secondary | ICD-10-CM | POA: Diagnosis not present

## 2015-12-09 DIAGNOSIS — R7989 Other specified abnormal findings of blood chemistry: Secondary | ICD-10-CM

## 2015-12-09 DIAGNOSIS — M1990 Unspecified osteoarthritis, unspecified site: Secondary | ICD-10-CM | POA: Diagnosis not present

## 2015-12-09 MED ORDER — TOPIRAMATE 25 MG PO TABS: ORAL_TABLET | ORAL | Status: DC

## 2015-12-09 MED ORDER — HYDROCODONE-ACETAMINOPHEN 5-325 MG PO TABS: 1.0000 | ORAL_TABLET | Freq: Four times a day (QID) | ORAL | Status: DC | PRN

## 2015-12-09 MED ORDER — CYCLOBENZAPRINE HCL 10 MG PO TABS: ORAL_TABLET | ORAL | Status: DC

## 2015-12-09 MED ORDER — CLONAZEPAM 0.5 MG PO TABS
0.2500 mg | ORAL_TABLET | Freq: Two times a day (BID) | ORAL | Status: DC | PRN
Start: 2015-12-09 — End: 2016-07-07

## 2015-12-09 NOTE — Patient Instructions (Signed)
Lanier Clam [facility] Deliah Boston PAC  Ruthe Mannan or Amy [place] at [facility])

## 2015-12-09 NOTE — Progress Notes (Signed)
Subjective:    [name], female    DOB: 12-30-1975, 40 y.o.   MRN: [id number]  HPIf/u Patient Active Problem List   Diagnosis Date Noted  . Leucocytosis--stable--see eval [facility] 12/09/2015  . Arthritis-seronegative--Dr D trying sulfasalazine 12/09/2015  . Elevated uric acid in blood---no hx of documented gout--monoartic event elbow aspir=no dx 05/10/2015  . Anemia--resolved w/oral FE 01/01/2014  . Insomnia--when necessary clonazepam works well  01/04/2013  . Polycystic ovaries 03/25/2012  . Hyperglycemia--A1C below 7 and dieting 03/25/2012  . Adult body mass index 37.0-37.9--no change 03/25/2012  . Cushing's syndrome-iatrogenic 03/25/2012  . 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 Hyperprolactinemia (HCC) 03/25/2012  . Alopecia (capitis) totalis---no change 03/25/2012  . Allergic rhinitis due to pollen 03/25/2012  . GERD (gastroesophageal reflux disease)--stbale on meds 03/25/2012  . Migraine headache---still marginal control requiring pain meds, flexeril and relpax and FMLA tho out of work only 1x a month--in many ways stable She often will take hydrocodone at the onset of the headache because she can continue to work and if she takes Relpax or Flexeril she is in the bed for the rest of the day. Topamax has made a difference in frequency of headaches and she is doing better in general. She would much prefer to work and not miss work and to not have to take any medication at all and there is no issue about overuse of hydrocodone.  03/25/2012    Current outpatient prescriptions:  .  atenolol (TENORMIN) 25 MG tablet, Take 0.5 tablets (12.5 mg total) by mouth daily. Takes 1/2 pill, Disp: 45 tablet, Rfl: 3 .  Bepotastine Besilate (BEPREVE) 1.5 % SOLN, , Disp: , Rfl:  .  Biotin 2.5 MG TABS, Take 2.5 mg by mouth daily., Disp: , Rfl:  .  clonazePAM (KLONOPIN) 0.5 MG tablet, Take 0.5 tablets (0.25 mg total) by mouth 2 (two) times daily as  needed for anxiety., Disp: 60 tablet, Rfl: 5 .  cyclobenzaprine (FLEXERIL) 10 MG tablet, TAKE 1 TABLET (10 MG TOTAL) BY MOUTH AT BEDTIME, Disp: 30 tablet, Rfl: 5 .  eletriptan (RELPAX) 40 MG tablet, TAKE 1 TABLET BY MOUTH AT ONSET OF HEADACHE, MAY REPEAT IN 2 HOURS IF NEEDED, Disp: 8 tablet, Rfl: 11 .  Fe Fum-Vit C-Vit B12-FA (TRIGELS-F FORTE) 460-60-0.01-1 MG CAPS capsule, TAKE 1 CAPSULE BY MOUTH DAILY, Disp: 30 capsule, Rfl: 3 .  HYDROcodone-acetaminophen (NORCO) 5-325 MG tablet, Take 1 tablet by mouth every 6 (six) hours as needed for moderate pain. 60d from signing date, Disp: 30 tablet, Rfl: 0 .  Melatonin 1 MG CAPS, Take 1 capsule by mouth at bedtime as needed., Disp: , Rfl:  .  omeprazole (PRILOSEC) 20 MG capsule, Take 20 mg by mouth daily., Disp: , Rfl:  .  ondansetron (ZOFRAN) 4 MG tablet, Take 1 tablet (4 mg total) by mouth every 8 (eight) hours as needed for nausea or vomiting., Disp: 20 tablet, Rfl: 5 .  sulfaSALAzine (AZULFIDINE) 500 MG tablet, Take 1,000 mg by mouth 2 (two) times daily., Disp: , Rfl:  .  topiramate (TOPAMAX) 25 MG tablet, TAKE 1-2 TABLETs BY MOUTH AT BEDTIME, Disp: 60 tablet, Rfl: 5     Review of Systems  Constitutional: Negative for activity change, appetite change, fatigue and unexpected weight change.  HENT: Negative for dental problem.   Eyes: Negative for visual disturbance.  Respiratory: Negative for shortness of breath and wheezing.   Cardiovascular: Negative for chest pain, palpitations and leg swelling.  Gastrointestinal: Negative for abdominal pain.  Genitourinary: Negative for difficulty urinating.       Objective:   Physical Exam BP 129/88 mmHg  Pulse 93  Temp(Src) 98.1 F (36.7 C)  Resp 16  Ht 5\' 2"  (1.575 m)  Wt 207 lb (93.895 kg)  BMI 37.85 kg/m2 PERRLA and EOMs conjugate No thyromegaly Total hair loss Regular rhythm No peripheral edema Joints currently clear Neurological intact Mood good and affect appropriate     Assessment  & Plan:  Migraine with aura and with status migrainosus, not intractable - Plan: HYDROcodone-acetaminophen (NORCO/VICODIN) 5-325 MG tablet, Relpax, Flexeril, Topamax, Zofran, Tenormin  Insomnia - Plan: clonazePAM (KLONOPIN) 0.5 MG tablet  Hyperprolactinemia (HCC)--stable by history  Alopecia (capitis) totalis--unfortunately no recent changes  Leucocytosis--not due to an underlying problem  Arthritis-seronegative--although multiple joints have been involved she is also had a few episodes of monoarticular arthritis in the elbow/at least once this responded in 2 days to acupuncture//given her elevated uric acid this should be considered at some point as an etiology  Elevated uric acid in blood--we discussed further dietary modifications  Anemia, unspecified anemia type--responded to iron and she will continue this  Autoimmune disease (HCC)--she has several medical issues that fall in this category and with Dr.Deveshwar's help we will continue to look for a singular entity//she will continue sulfasalazine for now  Meds ordered this encounter  Medications  . HYDROcodone-acetaminophen (NORCO/VICODIN) 5-325 MG tablet    Sig: Take 1 tablet by mouth every 6 (six) hours as needed. For 30d from signing date    Dispense:  30 tablet    Refill:  0  . HYDROcodone-acetaminophen (NORCO) 5-325 MG tablet    Sig: Take 1 tablet by mouth every 6 (six) hours as needed for moderate pain. 60d from signing date    Dispense:  30 tablet    Refill:  0  . HYDROcodone-acetaminophen (NORCO) 5-325 MG tablet    Sig: Take 1 tablet by mouth every 6 (six) hours as needed for moderate pain.    Dispense:  30 tablet    Refill:  0  . topiramate (TOPAMAX) 25 MG tablet    Sig: TAKE 1-2 TABLETs BY MOUTH AT BEDTIME    Dispense:  60 tablet    Refill:  5  . cyclobenzaprine (FLEXERIL) 10 MG tablet    Sig: TAKE 1 TABLET (10 MG TOTAL) BY MOUTH AT BEDTIME    Dispense:  30 tablet    Refill:  5  . clonazePAM (KLONOPIN) 0.5 MG  tablet    Sig: Take 0.5 tablets (0.25 mg total) by mouth 2 (two) times daily as needed for anxiety.    Dispense:  60 tablet    Refill:  5   Follow-up in 6 months with consideration for new provider: [facility]  [facility] or Ruthe Mannan at [facility]) near their home

## 2015-12-31 ENCOUNTER — Encounter: Payer: Self-pay | Admitting: Internal Medicine

## 2016-01-24 ENCOUNTER — Other Ambulatory Visit: Payer: Self-pay | Admitting: Internal Medicine

## 2016-01-25 ENCOUNTER — Telehealth: Payer: Self-pay | Admitting: Family Medicine

## 2016-01-25 NOTE — Telephone Encounter (Signed)
Pt would like for [place] to fax a copy of her labs to Dr Corliss Skains from Sept Nov Jan i have faxed results to Doctor per pt

## 2016-01-27 ENCOUNTER — Other Ambulatory Visit: Payer: Self-pay | Admitting: Internal Medicine

## 2016-02-08 ENCOUNTER — Encounter: Payer: Self-pay | Admitting: Internal Medicine

## 2016-02-09 ENCOUNTER — Encounter: Payer: Self-pay | Admitting: Internal Medicine

## 2016-03-07 ENCOUNTER — Other Ambulatory Visit (INDEPENDENT_AMBULATORY_CARE_PROVIDER_SITE_OTHER): Payer: BLUE CROSS/BLUE SHIELD | Admitting: *Deleted

## 2016-03-07 DIAGNOSIS — Z79899 Other long term (current) drug therapy: Secondary | ICD-10-CM | POA: Diagnosis not present

## 2016-03-07 LAB — CBC WITH DIFFERENTIAL/PLATELET
Basophils Absolute: 0 cells/uL (ref 0–200)
Basophils Relative: 0 %
Eosinophils Absolute: 210 cells/uL (ref 15–500)
Eosinophils Relative: 2 %
HCT: 42.8 % (ref 35.0–45.0)
Hemoglobin: 14.5 g/dL (ref 11.7–15.5)
Lymphocytes Relative: 34 %
Lymphs Abs: 3570 cells/uL (ref 850–3900)
MCH: 29.3 pg (ref 27.0–33.0)
MCHC: 33.9 g/dL (ref 32.0–36.0)
MCV: 86.5 fL (ref 80.0–100.0)
MPV: 9.8 fL (ref 7.5–12.5)
Monocytes Absolute: 630 cells/uL (ref 200–950)
Monocytes Relative: 6 %
Neutro Abs: 6090 cells/uL (ref 1500–7800)
Neutrophils Relative %: 58 %
Platelets: 350 10*3/uL (ref 140–400)
RBC: 4.95 MIL/uL (ref 3.80–5.10)
RDW: 13.1 % (ref 11.0–15.0)
WBC: 10.5 10*3/uL (ref 3.8–10.8)

## 2016-03-07 LAB — COMPREHENSIVE METABOLIC PANEL
ALT: 18 U/L (ref 6–29)
AST: 15 U/L (ref 10–30)
Albumin: 4.4 g/dL (ref 3.6–5.1)
Alkaline Phosphatase: 80 U/L (ref 33–115)
BUN: 9 mg/dL (ref 7–25)
CO2: 22 mmol/L (ref 20–31)
Calcium: 9.4 mg/dL (ref 8.6–10.2)
Chloride: 106 mmol/L (ref 98–110)
Creat: 0.66 mg/dL (ref 0.50–1.10)
Glucose, Bld: 106 mg/dL — ABNORMAL HIGH (ref 65–99)
Potassium: 4.1 mmol/L (ref 3.5–5.3)
Sodium: 139 mmol/L (ref 135–146)
Total Bilirubin: 0.5 mg/dL (ref 0.2–1.2)
Total Protein: 6.8 g/dL (ref 6.1–8.1)

## 2016-03-19 ENCOUNTER — Other Ambulatory Visit: Payer: Self-pay | Admitting: Internal Medicine

## 2016-04-23 ENCOUNTER — Ambulatory Visit (INDEPENDENT_AMBULATORY_CARE_PROVIDER_SITE_OTHER): Payer: BLUE CROSS/BLUE SHIELD | Admitting: Physician Assistant

## 2016-04-23 VITALS — BP 130/80 | HR 104 | Temp 98.0°F | Resp 18 | Ht 62.0 in | Wt 215.4 lb

## 2016-04-23 DIAGNOSIS — G43101 Migraine with aura, not intractable, with status migrainosus: Secondary | ICD-10-CM | POA: Diagnosis not present

## 2016-04-23 DIAGNOSIS — Z0189 Encounter for other specified special examinations: Secondary | ICD-10-CM

## 2016-04-23 LAB — CBC WITH DIFFERENTIAL/PLATELET
BASOS PCT: 0 %
Basophils Absolute: 0 cells/uL (ref 0–200)
EOS PCT: 2 %
Eosinophils Absolute: 200 cells/uL (ref 15–500)
HEMATOCRIT: 42.3 % (ref 35.0–45.0)
HEMOGLOBIN: 14.3 g/dL (ref 11.7–15.5)
Lymphocytes Relative: 29 %
Lymphs Abs: 2900 cells/uL (ref 850–3900)
MCH: 29.2 pg (ref 27.0–33.0)
MCHC: 33.8 g/dL (ref 32.0–36.0)
MCV: 86.3 fL (ref 80.0–100.0)
MONO ABS: 500 {cells}/uL (ref 200–950)
MPV: 9.8 fL (ref 7.5–12.5)
Monocytes Relative: 5 %
NEUTROS ABS: 6400 {cells}/uL (ref 1500–7800)
NEUTROS PCT: 64 %
Platelets: 389 10*3/uL (ref 140–400)
RBC: 4.9 MIL/uL (ref 3.80–5.10)
RDW: 13.3 % (ref 11.0–15.0)
WBC: 10 10*3/uL (ref 3.8–10.8)

## 2016-04-23 MED ORDER — HYDROCODONE-ACETAMINOPHEN 5-325 MG PO TABS: 1.0000 | ORAL_TABLET | Freq: Four times a day (QID) | ORAL | Status: DC | PRN

## 2016-04-23 NOTE — Progress Notes (Signed)
04/25/2016 12:37 PM   DOB: [birth year] / MRN: [id number]  SUBJECTIVE:  [name] is a 40 y.o. female presenting for medication refills and lab draw.  She is a long time patient of Dr. Laney Pastor and he had referred her to Dr. Lorelei Pont however patient failed to present to that appointment.  She takes multiple medications for migraine prophylaxis and takes norco when she can not control the HA.  She would like a three month supply of this today. She would like her CBC, CMP and uric acid drawn today for her rheumatologist.     She is allergic to darvocet; felodipine er; imitrex; prednisone; and imitrex.   She  has a past medical history of Migraines; Migraine; Alopecia areata totalis; Polycystic ovary disease; GERD (gastroesophageal reflux disease); Hypertension; Allergy; Anxiety; Alopecia; and Depression.    She  reports that she has never smoked. She has never used smokeless tobacco. She reports that she drinks alcohol. She reports that she does not use illicit drugs. She  reports that she currently engages in sexual activity. The patient  has past surgical history that includes Dilation and curettage of uterus; Cholecystectomy; and DNC.  Her family history includes Cancer in her father and mother; Hyperlipidemia in her father, maternal grandmother, and mother; Hypertension in her maternal grandmother, mother, and sister.  Review of Systems  Constitutional: Negative for fever and chills.  Eyes: Negative for blurred vision.  Respiratory: Negative for cough and shortness of breath.   Cardiovascular: Negative for chest pain.  Gastrointestinal: Negative for nausea and abdominal pain.  Genitourinary: Negative for dysuria, urgency and frequency.  Musculoskeletal: Negative for myalgias.  Skin: Negative for rash.  Neurological: Negative for dizziness, tingling and headaches.  Psychiatric/Behavioral: Negative for depression. The patient is not nervous/anxious.     Problem list and  medications reviewed and updated by myself where necessary, and exist elsewhere in the encounter.   OBJECTIVE:  BP 130/80 mmHg  Pulse 104  Temp(Src) 98 F (36.7 C) (Oral)  Resp 18  Ht 5' 2"  (1.575 m)  Wt 215 lb 6 oz (97.693 kg)  BMI 39.38 kg/m2  SpO2 97%  Physical Exam  Constitutional: She is oriented to person, place, and time.  Cardiovascular: Normal rate and regular rhythm.   Pulmonary/Chest: Effort normal and breath sounds normal.  Neurological: She is alert and oriented to person, place, and time.  Skin: Skin is warm and dry.    Results for orders placed or performed in visit on 04/23/16 (from the past 72 hour(s))  CBC with Differential/Platelet     Status: None   Collection Time: 04/23/16  4:08 PM  Result Value Ref Range   WBC 10.0 3.8 - 10.8 K/uL   RBC 4.90 3.80 - 5.10 MIL/uL   Hemoglobin 14.3 11.7 - 15.5 g/dL   HCT 42.3 35.0 - 45.0 %   MCV 86.3 80.0 - 100.0 fL   MCH 29.2 27.0 - 33.0 pg   MCHC 33.8 32.0 - 36.0 g/dL   RDW 13.3 11.0 - 15.0 %   Platelets 389 140 - 400 K/uL   MPV 9.8 7.5 - 12.5 fL   Neutro Abs 6400 1500 - 7800 cells/uL   Lymphs Abs 2900 850 - 3900 cells/uL   Monocytes Absolute 500 200 - 950 cells/uL   Eosinophils Absolute 200 15 - 500 cells/uL   Basophils Absolute 0 0 - 200 cells/uL   Neutrophils Relative % 64 %   Lymphocytes Relative 29 %   Monocytes Relative 5 %  Eosinophils Relative 2 %   Basophils Relative 0 %   Smear Review Criteria for review not met     Comment: ** Please note change in unit of measure and reference range(s). **  COMPLETE METABOLIC PANEL WITH GFR     Status: Abnormal   Collection Time: 04/23/16  4:08 PM  Result Value Ref Range   Sodium 141 135 - 146 mmol/L   Potassium 4.0 3.5 - 5.3 mmol/L   Chloride 106 98 - 110 mmol/L   CO2 21 20 - 31 mmol/L   Glucose, Bld 145 (H) 65 - 99 mg/dL   BUN 7 7 - 25 mg/dL   Creat 0.68 0.50 - 1.10 mg/dL   Total Bilirubin 0.5 0.2 - 1.2 mg/dL   Alkaline Phosphatase 91 33 - 115 U/L   AST  12 10 - 30 U/L   ALT 14 6 - 29 U/L   Total Protein 6.8 6.1 - 8.1 g/dL   Albumin 4.5 3.6 - 5.1 g/dL   Calcium 9.1 8.6 - 10.2 mg/dL   GFR, Est African American >89 >=60 mL/min   GFR, Est Non African American >89 >=60 mL/min  Uric Acid     Status: Abnormal   Collection Time: 04/23/16  4:18 PM  Result Value Ref Range   Uric Acid, Serum 8.1 (H) 2.5 - 7.0 mg/dL    Comment: Therapeutic target for gout patients: <6.0 mg/dL    No results found.  ASSESSMENT AND PLAN  Natale was seen today for medication refill.  Diagnoses and all orders for this visit:  Routine lab draw -     CBC with Differential/Platelet -     COMPLETE METABOLIC PANEL WITH GFR -     Uric Acid  Migraine with aura and with status migrainosus, not intractable: She is out of emergency medication today.  I have advised that I can provide her with one refill and one refill only.  She has been referred to a neurologist and if they can not eliminate her needs for chronic opioids then she will need to present to a pain specialist.  She was very unhappy with this plan however I have advised that I can not and will not manage her need for chronic opioid therapy.   -     HYDROcodone-acetaminophen (NORCO/VICODIN) 5-325 MG tablet; Take 1 tablet by mouth every 6 (six) hours as needed. NO REFILLS. -     Ambulatory referral to Neurology -     Ambulatory referral to Pain Clinic   The patient was advised to call or return to clinic if she does not see an improvement in symptoms or to seek the care of the closest emergency department if she worsens with the above plan.   Philis Fendt, MHS, [name] [facility] 04/25/2016 12:37 PM

## 2016-04-23 NOTE — Patient Instructions (Signed)
     IF you received an x-ray today, you will receive an invoice from [facility]. Please contact [facility] at [phone] with questions or concerns regarding your invoice.   IF you received labwork today, you will receive an invoice from [facility]. Please contact [facility] at [phone] with questions or concerns regarding your invoice.   Our billing staff will not be able to assist you with questions regarding bills from these companies.  You will be contacted with the lab results as soon as they are available. The fastest way to get your results is to activate your My Chart account. Instructions are located on the last page of this paperwork. If you have not heard from us regarding the results in 2 weeks, please contact this office.      

## 2016-04-24 LAB — COMPLETE METABOLIC PANEL WITH GFR
ALBUMIN: 4.5 g/dL (ref 3.6–5.1)
ALK PHOS: 91 U/L (ref 33–115)
ALT: 14 U/L (ref 6–29)
AST: 12 U/L (ref 10–30)
BILIRUBIN TOTAL: 0.5 mg/dL (ref 0.2–1.2)
BUN: 7 mg/dL (ref 7–25)
CALCIUM: 9.1 mg/dL (ref 8.6–10.2)
CO2: 21 mmol/L (ref 20–31)
Chloride: 106 mmol/L (ref 98–110)
Creat: 0.68 mg/dL (ref 0.50–1.10)
Glucose, Bld: 145 mg/dL — ABNORMAL HIGH (ref 65–99)
Potassium: 4 mmol/L (ref 3.5–5.3)
SODIUM: 141 mmol/L (ref 135–146)
TOTAL PROTEIN: 6.8 g/dL (ref 6.1–8.1)

## 2016-04-24 LAB — URIC ACID: URIC ACID, SERUM: 8.1 mg/dL — AB (ref 2.5–7.0)

## 2016-04-29 ENCOUNTER — Encounter (HOSPITAL_COMMUNITY): Payer: Self-pay

## 2016-04-29 ENCOUNTER — Ambulatory Visit (HOSPITAL_COMMUNITY)
Admission: EM | Admit: 2016-04-29 | Discharge: 2016-04-29 | Disposition: A | Discharge status: Home / Self Care | Payer: BLUE CROSS/BLUE SHIELD | Attending: Family Medicine | Admitting: Family Medicine

## 2016-04-29 DIAGNOSIS — S161XXA Strain of muscle, fascia and tendon at neck level, initial encounter: Secondary | ICD-10-CM | POA: Diagnosis not present

## 2016-04-29 DIAGNOSIS — S40012A Contusion of left shoulder, initial encounter: Secondary | ICD-10-CM | POA: Diagnosis not present
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
## 2016-05-04 ENCOUNTER — Ambulatory Visit (INDEPENDENT_AMBULATORY_CARE_PROVIDER_SITE_OTHER): Payer: BLUE CROSS/BLUE SHIELD | Admitting: Neurology

## 2016-05-04 ENCOUNTER — Encounter: Payer: Self-pay | Admitting: Neurology

## 2016-05-04 VITALS — BP 143/92 | HR 68 | Ht 62.0 in | Wt 213.0 lb

## 2016-05-04 DIAGNOSIS — G43101 Migraine with aura, not intractable, with status migrainosus: Secondary | ICD-10-CM

## 2016-05-04 MED ORDER — ONDANSETRON HCL 4 MG PO TABS: 4.0000 mg | ORAL_TABLET | Freq: Three times a day (TID) | ORAL | Status: DC | PRN

## 2016-05-04 MED ORDER — ELETRIPTAN HYDROBROMIDE 40 MG PO TABS: ORAL_TABLET | ORAL | Status: DC

## 2016-05-04 MED ORDER — PROPRANOLOL HCL ER 80 MG PO CP24: 80.0000 mg | ORAL_CAPSULE | Freq: Every day | ORAL | Status: DC

## 2016-05-04 MED ORDER — DIHYDROERGOTAMINE MESYLATE 4 MG/ML NA SOLN: 1.0000 | NASAL | Status: DC | PRN

## 2016-05-04 MED ORDER — TOPIRAMATE 25 MG PO TABS: 50.0000 mg | ORAL_TABLET | Freq: Every day | ORAL | Status: DC

## 2016-05-04 NOTE — Patient Instructions (Addendum)
Magnesium oxide 400 mg twice a day Riboflavin= vitamin B2 100 mg twice a day  Check with her insurance company to see the monthly allowance for  Zomig  Frova  Amerge  Relpax  Maxalt
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
## 2016-05-05 ENCOUNTER — Telehealth: Payer: Self-pay | Admitting: Neurology

## 2016-05-05 NOTE — Telephone Encounter (Signed)
Feliz Beam with [facility] in [place] is calling to discuss medication dihydroergotamine (MIGRANAL) 4 MG/ML nasal spray  contraindication with Relpax.

## 2016-05-05 NOTE — Telephone Encounter (Signed)
Per Dr. Terrace Arabia, pt has been informed to alternate between the use of these two medications.  She will not be using them in combination and it is okay to fill both prescriptions.  Called [facility] back and left a message on the physician line providing this information.

## 2016-07-01 ENCOUNTER — Other Ambulatory Visit: Payer: Self-pay

## 2016-07-01 DIAGNOSIS — G4700 Insomnia, unspecified: Secondary | ICD-10-CM

## 2016-07-01 MED ORDER — FE FUM-VIT C-VIT B12-FA 460-60-0.01-1 MG PO CAPS: ORAL_CAPSULE | ORAL | 1 refills | Status: DC

## 2016-07-01 NOTE — Telephone Encounter (Signed)
She needs an ov to [place] care with a new provider

## 2016-07-01 NOTE — Telephone Encounter (Signed)
Pharm reqs RF of clonazepam. [name] pt. Last Rx 12/09/15 written at [facility] for #60 + 5 RFs. I changed quantity to #30 to match 30 day fill according to sig.

## 2016-07-07 ENCOUNTER — Other Ambulatory Visit: Payer: Self-pay

## 2016-07-07 DIAGNOSIS — G4700 Insomnia, unspecified: Secondary | ICD-10-CM

## 2016-07-07 MED ORDER — CLONAZEPAM 0.5 MG PO TABS: 0.2500 mg | ORAL_TABLET | Freq: Two times a day (BID) | ORAL | 0 refills | Status: DC | PRN

## 2016-07-07 NOTE — Telephone Encounter (Addendum)
Pharm reqs RF of clonazepam. Pended for 1 mos w/note to RTC to est w/new provider. Last seen 12/09/15 by Merla Riches and given 6 mos of RFs at [facility]. Looks like she has appt in Nov to est care w/provider at [facility].

## 2016-07-07 NOTE — Telephone Encounter (Signed)
Meds ordered this encounter  Medications  . clonazePAM (KLONOPIN) 0.5 MG tablet    Sig: Take 0.5 tablets (0.25 mg total) by mouth 2 (two) times daily as needed for anxiety.    Dispense:  60 tablet    Refill:  0    PATIENT NEEDS OFFICE VISIT TO ESTABLISH CARE WITH NEW PCP FOR ADDITIONAL REFILLS

## 2016-07-08 NOTE — Telephone Encounter (Signed)
Faxed

## 2016-07-18 ENCOUNTER — Encounter: Payer: Self-pay | Admitting: Neurology

## 2016-07-18 ENCOUNTER — Ambulatory Visit (INDEPENDENT_AMBULATORY_CARE_PROVIDER_SITE_OTHER): Payer: BLUE CROSS/BLUE SHIELD | Admitting: Neurology

## 2016-07-18 DIAGNOSIS — G43101 Migraine with aura, not intractable, with status migrainosus: Secondary | ICD-10-CM

## 2016-07-18 MED ORDER — ELETRIPTAN HYDROBROMIDE 40 MG PO TABS: 40.0000 mg | ORAL_TABLET | ORAL | 11 refills | Status: DC | PRN

## 2016-07-18 MED ORDER — HYDROCODONE-ACETAMINOPHEN 5-325 MG PO TABS: 1.0000 | ORAL_TABLET | Freq: Four times a day (QID) | ORAL | 0 refills | Status: DC | PRN

## 2016-07-18 MED ORDER — NORTRIPTYLINE HCL 10 MG PO CAPS: 20.0000 mg | ORAL_CAPSULE | Freq: Every day | ORAL | 11 refills | Status: DC

## 2016-07-18 MED ORDER — DICLOFENAC POTASSIUM(MIGRAINE) 50 MG PO PACK: 50.0000 mg | PACK | ORAL | 11 refills | Status: DC | PRN
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
## 2016-07-20 ENCOUNTER — Telehealth: Payer: Self-pay | Admitting: *Deleted

## 2016-07-20 MED ORDER — DICLOFENAC POTASSIUM(MIGRAINE) 50 MG PO PACK: 50.0000 mg | PACK | ORAL | 11 refills | Status: DC | PRN

## 2016-07-20 NOTE — Telephone Encounter (Signed)
Cambia PA approved by [employer] [phone]) through 07/20/17.  NI#[id number]. XF#[id number]. Her insurance will only allow #9/30 days.

## 2016-08-01 ENCOUNTER — Other Ambulatory Visit: Payer: Self-pay

## 2016-08-01 MED ORDER — CYCLOBENZAPRINE HCL 10 MG PO TABS: ORAL_TABLET | ORAL | 2 refills | Status: DC

## 2016-08-04 ENCOUNTER — Telehealth: Payer: Self-pay | Admitting: Neurology

## 2016-08-04 ENCOUNTER — Other Ambulatory Visit: Payer: Self-pay | Admitting: Obstetrics and Gynecology

## 2016-08-04 NOTE — Telephone Encounter (Signed)
Patient called requesting to speak with nurse regarding weaning off of topiramate (TOPAMAX) 25 MG tablet, "has been on it for a while", how to wean off of so that she can take Lo-LoEstrin birth control, states she's been on period since August, Pharmacist advised she can't be on both medications at the same time. Please call to advise.

## 2016-08-05 NOTE — Telephone Encounter (Signed)
Chart reviewed, patient is taking low-dose Topamax as migraine prevention, also taking nortriptyline, and Inderal, it is okay for her to wean off Topamax

## 2016-08-08 NOTE — Telephone Encounter (Signed)
Spoke to patient - she is only taking topiramate 25mg  at bedtime.  Per Dr. , ok to stop medication but continue taking nortriptyline and Inderal.

## 2016-08-08 NOTE — Telephone Encounter (Signed)
Left message for a return call

## 2016-08-18 ENCOUNTER — Encounter (HOSPITAL_COMMUNITY): Payer: Self-pay | Admitting: *Deleted

## 2016-08-19 ENCOUNTER — Other Ambulatory Visit: Payer: Self-pay | Admitting: Obstetrics and Gynecology

## 2016-08-25 ENCOUNTER — Encounter (HOSPITAL_COMMUNITY)
Admission: RE | Admit: 2016-08-25 | Discharge: 2016-08-25 | Disposition: A | Discharge status: Home / Self Care | Payer: BLUE CROSS/BLUE SHIELD | Source: Ambulatory Visit | Attending: Family Medicine | Admitting: Family Medicine

## 2016-08-25 ENCOUNTER — Encounter (HOSPITAL_COMMUNITY): Payer: Self-pay

## 2016-08-25 ENCOUNTER — Other Ambulatory Visit: Payer: Self-pay

## 2016-08-25 DIAGNOSIS — N921 Excessive and frequent menstruation with irregular cycle: Secondary | ICD-10-CM | POA: Diagnosis not present

## 2016-08-25 DIAGNOSIS — D649 Anemia, unspecified: Secondary | ICD-10-CM | POA: Diagnosis not present

## 2016-08-25 DIAGNOSIS — E282 Polycystic ovarian syndrome: Secondary | ICD-10-CM | POA: Diagnosis not present

## 2016-08-25 DIAGNOSIS — Z9049 Acquired absence of other specified parts of digestive tract: Secondary | ICD-10-CM | POA: Diagnosis not present

## 2016-08-25 DIAGNOSIS — L630 Alopecia (capitis) totalis: Secondary | ICD-10-CM | POA: Diagnosis not present

## 2016-08-25 DIAGNOSIS — N920 Excessive and frequent menstruation with regular cycle: Secondary | ICD-10-CM | POA: Diagnosis present

## 2016-08-25 DIAGNOSIS — M1990 Unspecified osteoarthritis, unspecified site: Secondary | ICD-10-CM | POA: Diagnosis not present

## 2016-08-25 DIAGNOSIS — F329 Major depressive disorder, single episode, unspecified: Secondary | ICD-10-CM | POA: Diagnosis not present

## 2016-08-25 DIAGNOSIS — N840 Polyp of corpus uteri: Secondary | ICD-10-CM | POA: Diagnosis not present

## 2016-08-25 DIAGNOSIS — F419 Anxiety disorder, unspecified: Secondary | ICD-10-CM | POA: Diagnosis not present

## 2016-08-25 DIAGNOSIS — G43909 Migraine, unspecified, not intractable, without status migrainosus: Secondary | ICD-10-CM | POA: Diagnosis not present

## 2016-08-25 DIAGNOSIS — N946 Dysmenorrhea, unspecified: Secondary | ICD-10-CM | POA: Diagnosis not present

## 2016-08-25 DIAGNOSIS — K219 Gastro-esophageal reflux disease without esophagitis: Secondary | ICD-10-CM | POA: Diagnosis not present

## 2016-08-25 DIAGNOSIS — I10 Essential (primary) hypertension: Secondary | ICD-10-CM | POA: Diagnosis not present

## 2016-08-25 HISTORY — DX: Anemia, unspecified: D64.9

## 2016-08-25 HISTORY — DX: Unspecified osteoarthritis, unspecified site: M19.90

## 2016-08-25 LAB — CBC
HEMATOCRIT: 29.4 % — AB (ref 36.0–46.0)
HEMOGLOBIN: 9.6 g/dL — AB (ref 12.0–15.0)
MCH: 26.5 pg (ref 26.0–34.0)
MCHC: 32.7 g/dL (ref 30.0–36.0)
MCV: 81.2 fL (ref 78.0–100.0)
Platelets: 426 10*3/uL — ABNORMAL HIGH (ref 150–400)
RBC: 3.62 MIL/uL — AB (ref 3.87–5.11)
RDW: 13 % (ref 11.5–15.5)
WBC: 16.2 10*3/uL — ABNORMAL HIGH (ref 4.0–10.5)

## 2016-08-25 LAB — BASIC METABOLIC PANEL
Anion gap: 8 (ref 5–15)
BUN: 10 mg/dL (ref 6–20)
CHLORIDE: 104 mmol/L (ref 101–111)
CO2: 25 mmol/L (ref 22–32)
CREATININE: 0.66 mg/dL (ref 0.44–1.00)
Calcium: 9.1 mg/dL (ref 8.9–10.3)
GFR calc Af Amer: >60 mL/min (ref >60)
GFR calc non Af Amer: >60 mL/min (ref >60)
GLUCOSE: 166 mg/dL — AB (ref 65–99)
POTASSIUM: 4.5 mmol/L (ref 3.5–5.1)
Sodium: 137 mmol/L (ref 135–145)

## 2016-08-25 NOTE — Patient Instructions (Addendum)
Your procedure is scheduled on: Friday, 11/3  Enter through the Main Entrance of [facility] at:  9:30 am  Pick up the phone at the desk and dial [phone].  Call this number if you have problems the morning of surgery: [phone].  Remember: Do NOT eat or drink (including water) after midnight tonight (Thursday)  Take these medicines the morning of surgery with a SIP OF WATER:  Klonopin if needed.  Do NOT wear jewelry (body piercing), metal hair clips/bobby pins, make-up, or nail polish.  Do NOT wear lotions, powders, or perfumes.  You may wear deoderant.  Do NOT shave for 48 hours prior to surgery.  Do NOT bring valuables to the hospital.  Contacts, dentures, or bridgework may not be worn into surgery.  Have a responsible adult drive you home and stay with you for 24 hours after your procedure.  Home with husband [name] cell [phone].

## 2016-08-26 ENCOUNTER — Ambulatory Visit (HOSPITAL_COMMUNITY): Payer: BLUE CROSS/BLUE SHIELD | Admitting: Anesthesiology

## 2016-08-26 ENCOUNTER — Encounter (HOSPITAL_COMMUNITY)
Admission: RE | Disposition: A | Discharge status: Home / Self Care | Payer: Self-pay | Source: Ambulatory Visit | Attending: Obstetrics and Gynecology

## 2016-08-26 ENCOUNTER — Encounter (HOSPITAL_COMMUNITY): Payer: Self-pay | Admitting: *Deleted

## 2016-08-26 ENCOUNTER — Ambulatory Visit (HOSPITAL_COMMUNITY)
Admission: RE | Admit: 2016-08-26 | Discharge: 2016-08-26 | Disposition: A | Discharge status: Home / Self Care | Payer: BLUE CROSS/BLUE SHIELD | Source: Ambulatory Visit | Attending: Obstetrics and Gynecology | Admitting: Obstetrics and Gynecology

## 2016-08-26 DIAGNOSIS — M1990 Unspecified osteoarthritis, unspecified site: Secondary | ICD-10-CM | POA: Insufficient documentation

## 2016-08-26 DIAGNOSIS — G43909 Migraine, unspecified, not intractable, without status migrainosus: Secondary | ICD-10-CM | POA: Insufficient documentation

## 2016-08-26 DIAGNOSIS — I10 Essential (primary) hypertension: Secondary | ICD-10-CM | POA: Insufficient documentation

## 2016-08-26 DIAGNOSIS — D649 Anemia, unspecified: Secondary | ICD-10-CM | POA: Insufficient documentation

## 2016-08-26 DIAGNOSIS — N946 Dysmenorrhea, unspecified: Secondary | ICD-10-CM | POA: Insufficient documentation

## 2016-08-26 DIAGNOSIS — N921 Excessive and frequent menstruation with irregular cycle: Secondary | ICD-10-CM | POA: Insufficient documentation

## 2016-08-26 DIAGNOSIS — L630 Alopecia (capitis) totalis: Secondary | ICD-10-CM | POA: Insufficient documentation

## 2016-08-26 DIAGNOSIS — F419 Anxiety disorder, unspecified: Secondary | ICD-10-CM | POA: Insufficient documentation

## 2016-08-26 DIAGNOSIS — K219 Gastro-esophageal reflux disease without esophagitis: Secondary | ICD-10-CM | POA: Insufficient documentation

## 2016-08-26 DIAGNOSIS — Z9049 Acquired absence of other specified parts of digestive tract: Secondary | ICD-10-CM | POA: Insufficient documentation

## 2016-08-26 DIAGNOSIS — E282 Polycystic ovarian syndrome: Secondary | ICD-10-CM | POA: Insufficient documentation

## 2016-08-26 DIAGNOSIS — F329 Major depressive disorder, single episode, unspecified: Secondary | ICD-10-CM | POA: Insufficient documentation

## 2016-08-26 DIAGNOSIS — N840 Polyp of corpus uteri: Secondary | ICD-10-CM | POA: Insufficient documentation

## 2016-08-26 HISTORY — PX: DILATATION & CURETTAGE/HYSTEROSCOPY WITH MYOSURE: SHX6511

## 2016-08-26 LAB — PREGNANCY, URINE: PREG TEST UR: NEGATIVE

## 2016-08-26 SURGERY — DILATATION & CURETTAGE/HYSTEROSCOPY WITH MYOSURE
Anesthesia: General | Site: Uterus

## 2016-08-26 MED ORDER — FENTANYL CITRATE (PF) 100 MCG/2ML IJ SOLN
INTRAMUSCULAR | Status: DC | PRN
  Administered 2016-08-26 (×2): 50 ug via INTRAVENOUS

## 2016-08-26 MED ORDER — SCOPOLAMINE 1 MG/3DAYS TD PT72
1.0000 | MEDICATED_PATCH | Freq: Once | TRANSDERMAL | Status: DC
  Administered 2016-08-26: 1.5 mg via TRANSDERMAL

## 2016-08-26 MED ORDER — PROPOFOL 10 MG/ML IV BOLUS
INTRAVENOUS | Status: AC
  Filled 2016-08-26: qty 20

## 2016-08-26 MED ORDER — LACTATED RINGERS IV SOLN
INTRAVENOUS | Status: DC
  Administered 2016-08-26 (×2): via INTRAVENOUS

## 2016-08-26 MED ORDER — HYDROMORPHONE HCL 1 MG/ML IJ SOLN
INTRAMUSCULAR | Status: AC
  Filled 2016-08-26: qty 1

## 2016-08-26 MED ORDER — PROPOFOL 10 MG/ML IV BOLUS
INTRAVENOUS | Status: DC | PRN
  Administered 2016-08-26: 200 mg via INTRAVENOUS
  Administered 2016-08-26: 30 mg via INTRAVENOUS
  Administered 2016-08-26: 50 mg via INTRAVENOUS

## 2016-08-26 MED ORDER — LIDOCAINE HCL (CARDIAC) 20 MG/ML IV SOLN
INTRAVENOUS | Status: AC
  Filled 2016-08-26: qty 5

## 2016-08-26 MED ORDER — DOXYCYCLINE HYCLATE 50 MG PO CAPS
100.0000 mg | ORAL_CAPSULE | Freq: Two times a day (BID) | ORAL | 0 refills | Status: AC
Start: 2016-08-26 — End: 2016-09-02

## 2016-08-26 MED ORDER — HYDROMORPHONE HCL 1 MG/ML IJ SOLN
INTRAMUSCULAR | Status: DC
Start: 2016-08-26 — End: 2016-08-26
  Filled 2016-08-26: qty 1

## 2016-08-26 MED ORDER — HYDROMORPHONE HCL 1 MG/ML IJ SOLN
0.2500 mg | INTRAMUSCULAR | Status: DC | PRN
  Administered 2016-08-26 (×4): 0.5 mg via INTRAVENOUS

## 2016-08-26 MED ORDER — PHENYLEPHRINE HCL 10 MG/ML IJ SOLN
INTRAMUSCULAR | Status: DC | PRN
  Administered 2016-08-26 (×3): 40 ug via INTRAVENOUS

## 2016-08-26 MED ORDER — LIDOCAINE 2% (20 MG/ML) 5 ML SYRINGE
INTRAMUSCULAR | Status: DC | PRN
  Administered 2016-08-26: 30 mg via INTRAVENOUS

## 2016-08-26 MED ORDER — LIDOCAINE HCL (PF) 2 % IJ SOLN
INTRAMUSCULAR | Status: DC | PRN
  Administered 2016-08-26: 9 mL via INTRADERMAL

## 2016-08-26 MED ORDER — MIDAZOLAM HCL 5 MG/5ML IJ SOLN
INTRAMUSCULAR | Status: DC | PRN
  Administered 2016-08-26: 2 mg via INTRAVENOUS

## 2016-08-26 MED ORDER — IBUPROFEN 200 MG PO TABS: 600.0000 mg | ORAL_TABLET | Freq: Four times a day (QID) | ORAL | 0 refills | Status: DC | PRN

## 2016-08-26 MED ORDER — SILVER NITRATE-POT NITRATE 75-25 % EX MISC
CUTANEOUS | Status: AC
  Filled 2016-08-26: qty 1

## 2016-08-26 MED ORDER — SCOPOLAMINE 1 MG/3DAYS TD PT72
MEDICATED_PATCH | TRANSDERMAL | Status: AC
  Administered 2016-08-26: 1.5 mg via TRANSDERMAL
  Filled 2016-08-26: qty 1

## 2016-08-26 MED ORDER — SODIUM CHLORIDE 0.9 % IR SOLN
Status: DC | PRN
Start: 2016-08-26 — End: 2016-08-26
  Administered 2016-08-26: 3000 mL

## 2016-08-26 MED ORDER — ONDANSETRON HCL 4 MG/2ML IJ SOLN
INTRAMUSCULAR | Status: AC
  Filled 2016-08-26: qty 2

## 2016-08-26 MED ORDER — LIDOCAINE HCL 2 % IJ SOLN
INTRAMUSCULAR | Status: AC
  Filled 2016-08-26: qty 20

## 2016-08-26 MED ORDER — KETOROLAC TROMETHAMINE 30 MG/ML IJ SOLN
INTRAMUSCULAR | Status: AC
  Filled 2016-08-26: qty 1

## 2016-08-26 MED ORDER — MIDAZOLAM HCL 2 MG/2ML IJ SOLN
INTRAMUSCULAR | Status: AC
  Filled 2016-08-26: qty 2

## 2016-08-26 MED ORDER — ONDANSETRON HCL 4 MG/2ML IJ SOLN
INTRAMUSCULAR | Status: DC | PRN
  Administered 2016-08-26: 4 mg via INTRAVENOUS

## 2016-08-26 MED ORDER — PROMETHAZINE HCL 25 MG/ML IJ SOLN: 6.2500 mg | INTRAMUSCULAR | Status: DC | PRN

## 2016-08-26 MED ORDER — FENTANYL CITRATE (PF) 100 MCG/2ML IJ SOLN
INTRAMUSCULAR | Status: AC
  Filled 2016-08-26: qty 2

## 2016-08-26 SURGICAL SUPPLY — 21 items
CANISTER SUCT 3000ML (MISCELLANEOUS) ×3 IMPLANT
CATH ROBINSON RED A/P 16FR (CATHETERS) ×3 IMPLANT
CLOTH BEACON ORANGE TIMEOUT ST (SAFETY) ×3 IMPLANT
CONTAINER PREFILL 10% NBF 60ML (FORM) ×6 IMPLANT
DEVICE MYOSURE LITE (MISCELLANEOUS) ×3 IMPLANT
DEVICE MYOSURE REACH (MISCELLANEOUS) IMPLANT
DILATOR CANAL MILEX (MISCELLANEOUS) IMPLANT
ELECT REM PT RETURN 9FT ADLT (ELECTROSURGICAL) ×3
ELECTRODE REM PT RTRN 9FT ADLT (ELECTROSURGICAL) ×1 IMPLANT
FILTER ARTHROSCOPY CONVERTOR (FILTER) ×3 IMPLANT
GLOVE BIO SURGEON STRL SZ 6.5 (GLOVE) ×4 IMPLANT
GLOVE BIO SURGEONS STRL SZ 6.5 (GLOVE) ×2
GLOVE BIOGEL PI IND STRL 7.0 (GLOVE) ×3 IMPLANT
GLOVE BIOGEL PI INDICATOR 7.0 (GLOVE) ×6
GOWN STRL REUS W/TWL LRG LVL3 (GOWN DISPOSABLE) ×6 IMPLANT
PACK VAGINAL MINOR WOMEN LF (CUSTOM PROCEDURE TRAY) ×3 IMPLANT
PAD OB MATERNITY 4.3X12.25 (PERSONAL CARE ITEMS) ×3 IMPLANT
SEAL ROD LENS SCOPE MYOSURE (ABLATOR) ×3 IMPLANT
TOWEL OR 17X24 6PK STRL BLUE (TOWEL DISPOSABLE) ×6 IMPLANT
TUBING AQUILEX INFLOW (TUBING) ×3 IMPLANT
TUBING AQUILEX OUTFLOW (TUBING) ×3 IMPLANT

## 2016-08-26 NOTE — Anesthesia Procedure Notes (Signed)
Procedure Name: LMA Insertion Date/Time: 08/26/2016 11:25 AM Performed by: Jazzie Trampe G Pre-anesthesia Checklist: Patient identified, Emergency Drugs available, Suction available, Patient being monitored and Timeout performed Patient Re-evaluated:Patient Re-evaluated prior to inductionOxygen Delivery Method: Circle system utilized Preoxygenation: Pre-oxygenation with 100% oxygen Intubation Type: IV induction Ventilation: Mask ventilation with difficulty LMA: LMA inserted LMA Size: 4.0 Tube secured with: Tape

## 2016-08-26 NOTE — Transfer of Care (Signed)
Immediate Anesthesia Transfer of Care Note  Patient: [name]  Procedure(s) Performed: Procedure(s): DILATATION & CURETTAGE/HYSTEROSCOPY WITH MYOSURE (N/A)  Patient Location: PACU  Anesthesia Type:General  Level of Consciousness: awake  Airway & Oxygen Therapy: Patient Spontanous Breathing and Patient connected to nasal cannula oxygen  Post-op Assessment: Report given to RN and Post -op Vital signs reviewed and stable  Post vital signs: Reviewed  Last Vitals: There were no vitals filed for this visit.  Last Pain: There were no vitals filed for this visit.       Complications: No apparent anesthesia complications

## 2016-08-26 NOTE — H&P (Signed)
Mary Deland Grubbs-Livengood40 y.o. female. Who presents with heavy and irreg vaginal bleeding for 8 weeks.  .  She has uses 1-2 pads/ tampons every hour while menstruating.  She denies any CP or SOB.  Tylenol makes it better.  Nothing makes it worse.  positive dysmenorrhea.  Pt has tried provera  without success. Pertinent Gynecological History: Contraception: Education given regarding options for contraception, including none. Blood transfusions: none Sexually transmitted diseases: na Previous GYN Procedures: D&C hysterosopy Last mammogram:  Last pap: normal Date: 2016 WNL OB History: 0   Menstrual History: Menarche age: 42 Patient's last menstrual period was 06/05/2016.    Past Medical History:  Diagnosis Date  . Allergy   . Alopecia   . Alopecia areata totalis   . Anemia   . Anxiety   . Arthritis   . Depression   . GERD (gastroesophageal reflux disease)   . Hypertension   . Migraine   . Polycystic ovary disease    Past Surgical History:  Procedure Laterality Date  . CHOLECYSTECTOMY    . DILATION AND CURETTAGE OF UTERUS    . DNC    . NASAL SEPTUM SURGERY    . WISDOM TOOTH EXTRACTION      Current Facility-Administered Medications:  .  lactated ringers infusion, , Intravenous, Continuous, Shelton Silvas, MD, Last Rate: 125 mL/hr at 08/26/16 1006 .  scopolamine (TRANSDERM-SCOP) 1 MG/3DAYS 1.5 mg, 1 patch, Transdermal, Once, Shelton Silvas, MD, 1.5 mg at 08/26/16 0943 Allergies  Allergen Reactions  . Darvocet [Propoxyphene N-Acetaminophen] Other (See Comments)    Reaction:  Unknown   . Felodipine Er Swelling and Other (See Comments)    Reaction:  Lower extremity swelling   . Prednisone Swelling and Other (See Comments)    Reaction:  Facial swelling   . Imitrex [Sumatriptan Succinate] Palpitations and Other (See Comments)    Reaction:  Chest pain    Review of Systems - Endocrine ROS: positive for - autoimmmune condition Genito-Urinary ROS: see above   Physical Exam   LMP 06/05/2016  Constitutional: She appears well-developed and well-nourished.  HENT:  Head: Normocephalic.  Eyes: Pupils are equal, round, and reactive to light.  Neck: Normal range of motion. Neck supple.  Cardiovascular: Regular rhythm.   Respiratory: Effort normal and breath sounds normal.  GI: Soft.  Genitourinary:moderate VB with normal anatomy  Musculoskeletal: Normal range of motion.  Neurological: She is alert.  Skin: Skin is warm.  Psychiatric: She has a normal mood and affect.  Results for orders placed or performed during the hospital encounter of 08/26/16 (from the past 72 hour(s))  Pregnancy, urine     Status: None   Collection Time: 08/26/16  9:55 AM  Result Value Ref Range   Preg Test, Ur NEGATIVE NEGATIVE    Comment:        THE SENSITIVITY OF THIS METHODOLOGY IS >20 mIU/mL.    [place] width  Length Ovaries  Assessment/Plan: Menometrorrhagia with polyps Pt offered  obs vs surgery.  Pt chose surgery.  Plan D&C hysteroscopy polypectomy.  Risks are but not limited to bleeding, infection, scarring of the uterus and perforation.     Egon Dittus A 09/12/2011, 11:41 AM

## 2016-08-26 NOTE — Discharge Instructions (Signed)
Hysteroscopy Hysteroscopy is a procedure used for looking inside the womb (uterus). It may be done for various reasons, including:  To evaluate abnormal bleeding, fibroid (benign, noncancerous) tumors, polyps, scar tissue (adhesions), and possibly cancer of the uterus.  To look for lumps (tumors) and other uterine growths.  To look for causes of why a woman cannot get pregnant (infertility), causes of recurrent loss of pregnancy (miscarriages), or a lost intrauterine device (IUD).  To perform a sterilization by blocking the fallopian tubes from inside the uterus. In this procedure, a thin, flexible tube with a tiny light and camera on the end of it (hysteroscope) is used to look inside the uterus. A hysteroscopy should be done right after a menstrual period to be sure you are not pregnant. LET [facility] CARE PROVIDER KNOW ABOUT:   Any allergies you have.  All medicines you are taking, including vitamins, herbs, eye drops, creams, and over-the-counter medicines.  Previous problems you or members of your family have had with the use of anesthetics.  Any blood disorders you have.  Previous surgeries you have had.  Medical conditions you have. RISKS AND COMPLICATIONS  Generally, this is a safe procedure. However, as with any procedure, complications can occur. Possible complications include:  Putting a hole in the uterus.  Excessive bleeding.  Infection.  Damage to the cervix.  Injury to other organs.  Allergic reaction to medicines.  Too much fluid used in the uterus for the procedure. BEFORE THE PROCEDURE   Ask your health care provider about changing or stopping any regular medicines.  Do not take aspirin or blood thinners for 1 week before the procedure, or as directed by your health care provider. These can cause bleeding.  If you smoke, do not smoke for 2 weeks before the procedure.  In some cases, a medicine is placed in the cervix the day before the procedure.  This medicine makes the cervix have a larger opening (dilate). This makes it easier for the instrument to be inserted into the uterus during the procedure.  Do not eat or drink anything for at least 8 hours before the surgery.  Arrange for someone to take you home after the procedure. PROCEDURE   You may be given a medicine to relax you (sedative). You may also be given one of the following:  A medicine that numbs the area around the cervix (local anesthetic).  A medicine that makes you sleep through the procedure (general anesthetic).  The hysteroscope is inserted through the vagina into the uterus. The camera on the hysteroscope sends a picture to a TV screen. This gives the surgeon a good view inside the uterus.  During the procedure, air or a liquid is put into the uterus, which allows the surgeon to see better.  Sometimes, tissue is gently scraped from inside the uterus. These tissue samples are sent to a lab for testing. AFTER THE PROCEDURE   If you had a general anesthetic, you may be groggy for a couple hours after the procedure.  If you had a local anesthetic, you will be able to go home as soon as you are stable and feel ready.  You may have some cramping. This normally lasts for a couple days.  You may have bleeding, which varies from light spotting for a few days to menstrual-like bleeding for 3-7 days. This is normal.  If your test results are not back during the visit, make an appointment with your health care provider to find out the  results.   This information is not intended to replace advice given to you by your health care provider. Make sure you discuss any questions you have with your health care provider.   Document Released: 01/16/2001 Document Revised: 07/31/2013 Document Reviewed: 05/09/2013 Elsevier Interactive Patient Education 2016 Elsevier Inc[employer]  DISCHARGE INSTRUCTIONS: HYSTEROSCOPY / ENDOMETRIAL ABLATION The following instructions have been prepared  to help you care for yourself upon your return home.  May Remove Scop patch on or before Monday 08/29/16. Wash your hands with soap and water after contact with the patch.  May take Ibuprofen after  6:15 pm 08/26/16.  May take stool softner while taking narcotic pain medication to prevent constipation.  Drink plenty of water.  Personal hygiene:  Use sanitary pads for vaginal drainage, not tampons.  Shower the day after your procedure.  NO tub baths, pools or Jacuzzis for 2-3 weeks.  Wipe front to back after using the bathroom.  Activity and limitations:  Do NOT drive or operate any equipment for 24 hours. The effects of anesthesia are still present and drowsiness may result.  Do NOT rest in bed all day.  Walking is encouraged.  Walk up and down stairs slowly.  You may resume your normal activity in one to two days or as indicated by your physician. Sexual activity: NO intercourse for at least 2 weeks after the procedure, or as indicated by your Doctor.  Diet: Eat a light meal as desired this evening. You may resume your usual diet tomorrow.  Return to Work: You may resume your work activities in one to two days or as indicated by Therapist, sports.  What to expect after your surgery: Expect to have vaginal bleeding/discharge for 2-3 days and spotting for up to 10 days. It is not unusual to have soreness for up to 1-2 weeks. You may have a slight burning sensation when you urinate for the first day. Mild cramps may continue for a couple of days. You may have a regular period in 2-6 weeks.  Call your doctor for any of the following:  Excessive vaginal bleeding or clotting, saturating and changing one pad every hour.  Inability to urinate 6 hours after discharge from hospital.  Pain not relieved by pain medication.  Fever of 100.4 F or greater.  Unusual vaginal discharge or odor.  Return to office _________________Call for an appointment ___________________ Patients  signature: ______________________ Nurses signature ________________________  Post Anesthesia Care Unit [phone]

## 2016-08-26 NOTE — Anesthesia Postprocedure Evaluation (Signed)
Anesthesia Post Note  Patient: [name]  Procedure(s) Performed: Procedure(s) (LRB): DILATATION & CURETTAGE/HYSTEROSCOPY WITH MYOSURE (N/A)  Patient location during evaluation: PACU Anesthesia Type: General Level of consciousness: sedated Pain management: pain level controlled Vital Signs Assessment: post-procedure vital signs reviewed and stable Respiratory status: spontaneous breathing and respiratory function stable Cardiovascular status: stable Anesthetic complications: no     Last Vitals:  Vitals:   08/26/16 1330 08/26/16 1345  BP: 121/78 123/72  Pulse: 85 83  Resp: 15 12  Temp:  36.6 C    Last Pain:  Vitals:   08/26/16 1345  PainSc: 3    Pain Goal: Patients Stated Pain Goal: 3 (08/26/16 1345)               Jurni Cesaro DANIEL

## 2016-08-26 NOTE — Op Note (Signed)
Pre op DX Menometrorrhagia and endometrial polyp   Post Op [id number]   PHYSICIAN : Giovannina Mun   ASSISTANTS: none   ANESTHESIA:   General LMA and paracervical block  ESTIMATED BLOOD LOSS: minimal  LOCAL MEDICATIONS USED:  LIDOCAINE 20CC  SPECIMEN:  Source of Specimen: polyp and  endometrial curettings  DISPOSITION OF SPECIMEN:  PATHOLOGY  COUNTS Correct:  YES    DICTATION #: The patient was taken to the operating room and prepped and draped in a normal sterile fashion. An in out catheter was used to drain the bladder.   A bivalve speculum was placed into the vagina and anterior lip of the cervix was grasped with a single-tooth tenaculum.  20 cc of 2% lidocaine was used for cervical block.  the cervix was then dilated with Shawnie Pons dilators up to 21. The hysteroscope was placed into the uterine cavity. The  entire uterus and both ostia were visualized. There was a small polyp near the right ostium and fluffy endometrium.  myosure was used to remove the polyp and some of the endometrium.    Hyseroscope was then removed from the uterus. A sharp curettage was then done with a curette and endometrial curettings were obtained. The endometrial curettings were sent to pathology. Again the hysteroscope was placed into the uterine cavity. Both ostia were again visualized.   The tenaculum was removed from the cervix and hemostasis was noted.   PLAN OF CARE: discharge to home  PATIENT DISPOSITION:  PACU - hemodynamically stable.

## 2016-08-26 NOTE — Anesthesia Preprocedure Evaluation (Addendum)
Anesthesia Evaluation  Patient identified by MRN, date of birth, ID band Patient awake    Reviewed: Allergy & Precautions, NPO status , Unable to perform ROS - Chart review only  History of Anesthesia Complications Negative for: history of anesthetic complications  Airway Mallampati: II  TM Distance: >3 FB Neck ROM: Full    Dental no notable dental hx. (+) Dental Advisory Given   Pulmonary neg pulmonary ROS,    Pulmonary exam normal        Cardiovascular hypertension, Pt. on home beta blockers Normal cardiovascular exam     Neuro/Psych  Headaches, PSYCHIATRIC DISORDERS Anxiety Depression    GI/Hepatic Neg liver ROS, GERD  Medicated,  Endo/Other  Morbid obesity  Renal/GU negative Renal ROS     Musculoskeletal   Abdominal   Peds  Hematology   Anesthesia Other Findings   Reproductive/Obstetrics                            Anesthesia Physical Anesthesia Plan  ASA: III  Anesthesia Plan: General   Post-op Pain Management:    Induction: Intravenous  Airway Management Planned: Oral ETT and LMA  Additional Equipment:   Intra-op Plan:   Post-operative Plan: Extubation in OR  Informed Consent: I have reviewed the patients History and Physical, chart, labs and discussed the procedure including the risks, benefits and alternatives for the proposed anesthesia with the patient or authorized representative who has indicated his/her understanding and acceptance.   Dental advisory given  Plan Discussed with: CRNA  Anesthesia Plan Comments:       Anesthesia Quick Evaluation

## 2016-08-29 ENCOUNTER — Encounter (HOSPITAL_COMMUNITY): Payer: Self-pay | Admitting: Obstetrics and Gynecology

## 2016-08-29 ENCOUNTER — Ambulatory Visit (INDEPENDENT_AMBULATORY_CARE_PROVIDER_SITE_OTHER): Payer: BLUE CROSS/BLUE SHIELD | Admitting: Family Medicine

## 2016-08-29 VITALS — BP 138/88 | HR 86 | Temp 98.4°F | Ht 62.5 in | Wt 227.4 lb

## 2016-08-29 DIAGNOSIS — E221 Hyperprolactinemia: Secondary | ICD-10-CM | POA: Diagnosis not present

## 2016-08-29 DIAGNOSIS — E785 Hyperlipidemia, unspecified: Secondary | ICD-10-CM | POA: Diagnosis not present

## 2016-08-29 DIAGNOSIS — D72829 Elevated white blood cell count, unspecified: Secondary | ICD-10-CM

## 2016-08-29 DIAGNOSIS — E790 Hyperuricemia without signs of inflammatory arthritis and tophaceous disease: Secondary | ICD-10-CM

## 2016-08-29 DIAGNOSIS — R739 Hyperglycemia, unspecified: Secondary | ICD-10-CM

## 2016-08-29 DIAGNOSIS — G4700 Insomnia, unspecified: Secondary | ICD-10-CM

## 2016-08-29 DIAGNOSIS — M1990 Unspecified osteoarthritis, unspecified site: Secondary | ICD-10-CM

## 2016-08-29 MED ORDER — CLONAZEPAM 0.5 MG PO TABS: 0.2500 mg | ORAL_TABLET | Freq: Every evening | ORAL | 1 refills | Status: DC | PRN

## 2016-08-29 MED ORDER — CYCLOBENZAPRINE HCL 10 MG PO TABS: 10.0000 mg | ORAL_TABLET | Freq: Every day | ORAL | 5 refills | Status: DC

## 2016-08-29 NOTE — Patient Instructions (Signed)
Refilled meds  Schedule a lab visit at the check out desk within 2 weeks. Return for future fasting labs meaning nothing but water after midnight please. Ok to take your medications with water.   I would like to check in with you every 6 months for now  Go down to half tablet of klonopin and if do really well after 2 weeks, can trial off and then just use as needed

## 2016-08-29 NOTE — Assessment & Plan Note (Signed)
S: clonazepam helps her calm down given joint pain.  A/P: refilled as noted in other section but trial 0.25mg  and then of after several weeks if able

## 2016-08-29 NOTE — Progress Notes (Signed)
Pre visit review using our clinic review tool, if applicable. No additional management support is needed unless otherwise documented below in the visit note. 

## 2016-08-29 NOTE — Assessment & Plan Note (Signed)
S: Dr Estanislado Pandy in past has done workup- no cause found- patient also continues to follow up. Severe inflammation in elbow several years ago- swole up and couldn't move. No cause found Dr. Caralyn Guile. Back to rheumatology- Called inflammation arthritis- sulfasalazine seems to have helped some. Flexeril helps her sleep at night. Pain severe and also took clonazepam to help sleep. These medicines have been necessary to calm her and dull pain enough for her to sleep. Thought to have autoimmune etiology but no clear connecting tree (for elevated ESR, multiple joints with arthralgia, alopecia totalis-despite workup from rheumatology and alopecia specialist at [place] [facility]. Patient wonders about trying new agents through Rheumatology to see if could get further benefit A/P: Continue current follow up with rheumatology. Patient asks me for refill on the flexeril and clonazepam to help her rest- agreed to flexeril but have asked her to try to do half dose of klonopin at 0.24m.

## 2016-08-29 NOTE — Progress Notes (Signed)
Phone: [phone]  Subjective:  Patient presents today to establish care. Chief complaint-noted.   See problem oriented charting  The following were reviewed and entered/updated in epic: Past Medical History:  Diagnosis Date  . Allergy   . Alopecia areata totalis   . Anemia   . Anxiety   . Arthritis   . Depression    meds for 1 year around loss of father  . GERD (gastroesophageal reflux disease)   . Hypertension   . Migraine   . Polycystic ovary disease    Patient Active Problem List   Diagnosis Date Noted  . Leucocytosis 12/09/2015    Priority: High  . 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 Hyperprolactinemia ([place]) 03/25/2012    Priority: High  . 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 Migraine headache 03/25/2012    Priority: High  . Elevated uric acid in blood 05/10/2015    Priority: Medium  . Anemia 01/01/2014    Priority: Medium  . Insomnia 01/04/2013    Priority: Medium  . Polycystic ovaries 03/25/2012    Priority: Medium  . Hyperglycemia 03/25/2012    Priority: Medium  . Adult body mass index 37.0-37.9 03/25/2012    Priority: Low  . Cushing's syndrome-iatrogenic 03/25/2012    Priority: Low  . Allergic rhinitis due to pollen 03/25/2012    Priority: Low  . GERD (gastroesophageal reflux disease) 03/25/2012    Priority: Low  . Hyperlipidemia 08/29/2016   Past Surgical History:  Procedure Laterality Date  . CHOLECYSTECTOMY    . DILATATION & CURETTAGE/HYSTEROSCOPY WITH MYOSURE N/A 08/26/2016   #2  Procedure: [place];  Surgeon: Crawford Givens, MD;  Location: [facility];  Service: Gynecology;  Laterality: N/A;  . DILATION AND CURETTAGE OF UTERUS     #1  . NASAL SEPTUM SURGERY    . WISDOM TOOTH EXTRACTION      Family History  Problem Relation Age of Onset  . Hypertension Mother   . Hyperlipidemia Mother   . Breast cancer Mother     64  . Hyperlipidemia Maternal Grandmother     . Hypertension Maternal Grandmother   . Heart failure Maternal Grandmother   . Brain cancer Father     glioblastoma  . Hyperlipidemia Father   . Hypertension Sister     Medications- reviewed and updated Current Outpatient Prescriptions  Medication Sig Dispense Refill  . Bepotastine Besilate (BEPREVE) 1.5 % SOLN Place 1 drop into both eyes as needed (for allergies).     . cetirizine (ZYRTEC) 10 MG tablet Take 10 mg by mouth at bedtime as needed for allergies.     . clonazePAM (KLONOPIN) 0.5 MG tablet Take 0.5 tablets (0.25 mg total) by mouth at bedtime as needed for anxiety. 60 tablet 1  . cyclobenzaprine (FLEXERIL) 10 MG tablet Take 1 tablet (10 mg total) by mouth at bedtime. 30 tablet 5  . Diclofenac Potassium 50 MG PACK Take 50 mg by mouth as needed (for migraines).    Marland Kitchen doxycycline (VIBRAMYCIN) 50 MG capsule Take 2 capsules (100 mg total) by mouth 2 (two) times daily. 28 capsule 0  . eletriptan (RELPAX) 40 MG tablet Take 1 tablet (40 mg total) by mouth as needed for migraine or headache. May repeat in 2 hours if headache persists or recurs. 10 tablet 11  . Fe Fum-Vit C-Vit B12-FA (TRIGELS-F) 460-60-0.01-1 MG CAPS capsule Take 1 capsule by mouth every other day.    Marland Kitchen HYDROcodone-acetaminophen (NORCO/VICODIN) 5-325  MG tablet Take 1 tablet by mouth every 6 (six) hours as needed for moderate pain.    Marland Kitchen ibuprofen (ADVIL) 200 MG tablet Take 3 tablets (600 mg total) by mouth every 6 (six) hours as needed. 30 tablet 0  . Multiple Vitamin (MULITIVITAMIN WITH MINERALS) TABS Take 1 tablet by mouth daily.    . norgestimate-ethinyl estradiol (ORTHO-CYCLEN,SPRINTEC,PREVIFEM) 0.25-35 MG-MCG tablet Take 1 tablet by mouth daily.    . nortriptyline (PAMELOR) 10 MG capsule Take 10 mg by mouth at bedtime.    Marland Kitchen omeprazole (PRILOSEC) 20 MG capsule Take 20 mg by mouth at bedtime.     . ondansetron (ZOFRAN) 4 MG tablet Take 1 tablet (4 mg total) by mouth every 8 (eight) hours as needed for nausea or vomiting.  20 tablet 5  . propranolol ER (INDERAL LA) 80 MG 24 hr capsule Take 1 capsule (80 mg total) by mouth daily. (Patient taking differently: Take 80 mg by mouth at bedtime. ) 30 capsule 11  . sulfaSALAzine (AZULFIDINE) 500 MG tablet Take 1,000 mg by mouth 2 (two) times daily. Takes at 12 noon and midnight     No current facility-administered medications for this visit.     Allergies-reviewed and updated Allergies  Allergen Reactions  . Darvocet [Propoxyphene N-Acetaminophen] Other (See Comments)    Reaction:  Unknown   . Felodipine Er Swelling and Other (See Comments)    Reaction:  Lower extremity swelling   . Prednisone Swelling and Other (See Comments)    Reaction:  Facial swelling   . Imitrex [Sumatriptan Succinate] Palpitations and Other (See Comments)    Reaction:  Chest pain   . 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 Result was a rash.right. Hope Pigeon, RN    Social History   Social History  . Marital status: Married    Spouse name: N/A  . Number of children: 0  . Years of education: 4 yrs coll   Occupational History  . 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 Smoking status: Never Smoker  . Smokeless tobacco: Never Used  . Alcohol use 0.0 oz/week     Comment: occasional   . Drug use: No  . Sexual activity: Yes    Birth control/ protection: Pill   Other Topics Concern  . None   Social History Narrative    Married. No kids. 2 dogs, 3 cats. Lives at home with her husband.      Works for [employer]. CNA on renal floor.     ROS--Full ROS was completed Review of Systems  Constitutional: Negative for chills and fever.  HENT: Negative for ear pain and hearing loss.   Eyes: Negative for blurred vision and double vision.  Respiratory: Negative for cough, sputum production and shortness of breath.   Cardiovascular: Negative for chest pain  and palpitations.  Gastrointestinal: Negative for abdominal pain, nausea and vomiting.  Genitourinary: Negative for dysuria and urgency.  Musculoskeletal: Positive for back pain, joint pain and neck pain.  Skin: Negative for itching and rash.  Neurological: Negative for dizziness and headaches.  Endo/Heme/Allergies: Negative for polydipsia. Does not bruise/bleed easily.  Psychiatric/Behavioral: Negative for hallucinations. The patient has insomnia.    Objective: BP 138/88   Pulse 86   Temp 98.4 F (36.9 C) (Oral)   Ht 5' 2.5" (1.588 m)   Wt 227 lb  6.4 oz (103.1 kg)   LMP 06/05/2016   SpO2 96%   BMI 40.93 kg/m  Gen: NAD, resting comfortably HEENT: Mucous membranes are moist. Oropharynx normal. TM normal. Eyes: sclera and lids normal, PERRLA Neck: no thyromegaly, no cervical lymphadenopathy CV: RRR no murmurs rubs or gallops Lungs: CTAB no crackles, wheeze, rhonchi Abdomen: soft/nontender/nondistended/normal bowel sounds. No rebound or guarding. obese  Ext: no edema Skin: warm, dry, total alopecia from neck up noted Neuro: 5/5 strength in upper and lower extremities, normal gait, normal reflexes  Assessment/Plan:  Hyperprolactinemia (HCC) S: at time of visit and per problem list hyperprolactinemia was reported. No cause has been found in prior workup. After visit reviewed chart and noted Prolactin 2015 and 2016 normal. Appears patient was seen by endocrine Dr. Buddy Duty in 2012 and MR brain at that time notes hyperprolactinemia though no macro or micro adenoma of pituitary noted.  A/P: Unclear history here but patient's last 2 levels have been normal- would be worth considering repeating at least every other year.    Arthritis-seronegative S: Dr Estanislado Pandy in past has done workup- no cause found- patient also continues to follow up. Severe inflammation in elbow several years ago- swole up and couldn't move. No cause found Dr. Caralyn Guile. Back to rheumatology- Called inflammation  arthritis- sulfasalazine seems to have helped some. Flexeril helps her sleep at night. Pain severe and also took clonazepam to help sleep. These medicines have been necessary to calm her and dull pain enough for her to sleep. 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 Patient wonders about trying new agents through Rheumatology to see if could get further benefit A/P: Continue current follow up with rheumatology. Patient asks me for refill on the flexeril and clonazepam to help her rest- agreed to flexeril but have asked her to try to do half dose of klonopin at 0.11m.   Leucocytosis S: Dr FDahlia Byesoncology workup 2015 largely reassuring. Intermittent elevations below 15. Also worked up for anemia before heavy bleeding- no cause found. Stool cards negative A/P: continue to follow- did have some recent anemia after surgery but other than last CBC has had some normal WBC counts as of late.    Hyperglycemia S: steroid related in the past. a1c as high as 6.7- has been off steroids for at least 6 months though A/P: will update a1c- weight loss will be key- should be easier off of steroids though limited mobility with arthralgias as far as exercise.    Insomnia S: clonazepam helps her calm down given joint pain.  A/P: refilled as noted in other section but trial 0.212mand then of after several weeks if able   Hyperlipidemia S: patient states has had high cholesterol in past but does not recall lvels A/P: update lipids today- also get TSH with being overweight and history of potential pituitary issues  6 month follow up advised.  Declines flu, Hesitant on mammogram despite moms history- encouraged today, Dad passed at [id number] glioblastoma so her headaches can weigh heavy on her    Orders Placed This Encounter    Procedures  . CBC    [place]    Standing Status:   Future    Standing Expiration Date:   08/29/2017  . Comprehensive metabolic panel    [place]    Standing Status:   Future    Standing Expiration Date:  08/29/2017    Order Specific Question:   Has the patient fasted?    Answer:   No  . Lipid panel    [place]    Standing Status:   Future    Standing Expiration Date:   08/29/2017    Order Specific Question:   Has the patient fasted?    Answer:   No  . TSH    [place]    Standing Status:   Future    Standing Expiration Date:   08/29/2017  . Hemoglobin A1c    [place]    Standing Status:   Future    Standing Expiration Date:   08/29/2017    Meds ordered this encounter  Medications  . cyclobenzaprine (FLEXERIL) 10 MG tablet    Sig: Take 1 tablet (10 mg total) by mouth at bedtime.    Dispense:  30 tablet    Refill:  5  . clonazePAM (KLONOPIN) 0.5 MG tablet    Sig: Take 0.5 tablets (0.25 mg total) by mouth at bedtime as needed for anxiety.    Dispense:  60 tablet    Refill:  1   The duration of face-to-face time during this visit was greater than 45 minutes. Greater than 50% of this time was spent in counseling about difficulties of dealing with unclear diagnosis such as seronegative arthritis and alopecia of unknown cause, explanation of diagnosis, planning of further management including discussion of long term medications, and/or coordination of care including review of records with patient verbally as well as in chart and summarizing as above but also clarifying problem list and adding overview sections to further clarify diagnosis.   Return precautions advised.  Garret Reddish, MD

## 2016-08-29 NOTE — Assessment & Plan Note (Signed)
S: at time of visit and per problem list hyperprolactinemia was reported. No cause has been found in prior workup. After visit reviewed chart and noted Prolactin 2015 and 2016 normal. Appears patient was seen by endocrine Dr. Sharl Ma in 2012 and MR brain at that time notes hyperprolactinemia though no macro or micro adenoma of pituitary noted.  A/P: Unclear history here but patient's last 2 levels have been normal- would be worth considering repeating at least every other year.

## 2016-08-29 NOTE — Assessment & Plan Note (Signed)
S: patient states has had high cholesterol in past but does not recall lvels A/P: update lipids today- also get TSH with being overweight and history of potential pituitary issues

## 2016-08-29 NOTE — Assessment & Plan Note (Signed)
S: Dr Earl Gala oncology workup 2015 largely reassuring. Intermittent elevations below 15. Also worked up for anemia before heavy bleeding- no cause found. Stool cards negative A/P: continue to follow- did have some recent anemia after surgery but other than last CBC has had some normal WBC counts as of late.

## 2016-08-29 NOTE — Assessment & Plan Note (Signed)
S: steroid related in the past. a1c as high as 6.7- has been off steroids for at least 6 months though A/P: will update a1c- weight loss will be key- should be easier off of steroids though limited mobility with arthralgias as far as exercise.

## 2016-09-12 ENCOUNTER — Other Ambulatory Visit (INDEPENDENT_AMBULATORY_CARE_PROVIDER_SITE_OTHER): Payer: BLUE CROSS/BLUE SHIELD

## 2016-09-12 DIAGNOSIS — R7989 Other specified abnormal findings of blood chemistry: Secondary | ICD-10-CM

## 2016-09-12 DIAGNOSIS — E785 Hyperlipidemia, unspecified: Secondary | ICD-10-CM | POA: Diagnosis not present

## 2016-09-12 DIAGNOSIS — R739 Hyperglycemia, unspecified: Secondary | ICD-10-CM

## 2016-09-12 LAB — CBC
HEMATOCRIT: 31.9 % — AB (ref 36.0–46.0)
HEMOGLOBIN: 10.2 g/dL — AB (ref 12.0–15.0)
MCHC: 31.9 g/dL (ref 30.0–36.0)
MCV: 76.9 fl — ABNORMAL LOW (ref 78.0–100.0)
PLATELETS: 392 10*3/uL (ref 150.0–400.0)
RBC: 4.14 Mil/uL (ref 3.87–5.11)
RDW: 14.5 % (ref 11.5–15.5)
WBC: 13 10*3/uL — AB (ref 4.0–10.5)

## 2016-09-12 LAB — COMPREHENSIVE METABOLIC PANEL
ALBUMIN: 4 g/dL (ref 3.5–5.2)
ALK PHOS: 74 U/L (ref 39–117)
ALT: 8 U/L (ref 0–35)
AST: 11 U/L (ref 0–37)
BILIRUBIN TOTAL: 0.2 mg/dL (ref 0.2–1.2)
BUN: 10 mg/dL (ref 6–23)
CO2: 24 mEq/L (ref 19–32)
Calcium: 9.1 mg/dL (ref 8.4–10.5)
Chloride: 105 mEq/L (ref 96–112)
Creatinine, Ser: 0.67 mg/dL (ref 0.40–1.20)
GFR: 102.9 mL/min (ref >60.00)
GLUCOSE: 153 mg/dL — AB (ref 70–99)
POTASSIUM: 4.5 meq/L (ref 3.5–5.1)
Sodium: 141 mEq/L (ref 135–145)
TOTAL PROTEIN: 6.5 g/dL (ref 6.0–8.3)

## 2016-09-12 LAB — LIPID PANEL
CHOLESTEROL: 188 mg/dL (ref 0–200)
HDL: 40.9 mg/dL (ref >39.00)
NonHDL: 146.99
Total CHOL/HDL Ratio: 5
Triglycerides: 251 mg/dL — ABNORMAL HIGH (ref 0.0–149.0)
VLDL: 50.2 mg/dL — ABNORMAL HIGH (ref 0.0–40.0)

## 2016-09-12 LAB — LDL CHOLESTEROL, DIRECT: Direct LDL: 118 mg/dL

## 2016-09-12 LAB — HEMOGLOBIN A1C: HEMOGLOBIN A1C: 6.7 % — AB (ref 4.6–6.5)

## 2016-09-12 LAB — TSH: TSH: 3.84 u[IU]/mL (ref 0.35–4.50)

## 2016-09-14 ENCOUNTER — Telehealth: Payer: Self-pay | Admitting: Family Medicine

## 2016-09-14 NOTE — Telephone Encounter (Signed)
See other message

## 2016-09-14 NOTE — Telephone Encounter (Signed)
Pt is returning donna call °

## 2016-09-27 ENCOUNTER — Encounter: Payer: Self-pay | Admitting: Neurology

## 2016-09-27 ENCOUNTER — Other Ambulatory Visit: Payer: Self-pay | Admitting: Rheumatology

## 2016-09-27 ENCOUNTER — Encounter: Payer: Self-pay | Admitting: Family Medicine

## 2016-09-27 ENCOUNTER — Ambulatory Visit (INDEPENDENT_AMBULATORY_CARE_PROVIDER_SITE_OTHER): Payer: BLUE CROSS/BLUE SHIELD | Admitting: Family Medicine

## 2016-09-27 ENCOUNTER — Other Ambulatory Visit: Payer: Self-pay | Admitting: *Deleted

## 2016-09-27 VITALS — BP 124/88 | HR 89 | Temp 98.1°F | Ht 62.5 in | Wt 226.8 lb

## 2016-09-27 DIAGNOSIS — E119 Type 2 diabetes mellitus without complications: Secondary | ICD-10-CM

## 2016-09-27 MED ORDER — HYDROCODONE-ACETAMINOPHEN 5-325 MG PO TABS: 1.0000 | ORAL_TABLET | Freq: Four times a day (QID) | ORAL | 0 refills | Status: DC | PRN

## 2016-09-27 NOTE — Patient Instructions (Signed)
New diagnosis of diabetes with a1c 6.7. This is at goal under 7 still .   Let's work towards 150 minutes of exercise a week. Ok to start with 30 minutes a week or so and build up- do not want to set your joints back.   Water only to drink or black cofee  3-5 servings of veggies a day. Try to do half a plate veggies at lunch and dinner  Whole grain- avoid white pasta, white bread, white rice  Happy to refer you to diabetes education if needed.

## 2016-09-27 NOTE — Progress Notes (Addendum)
Subjective:  [name] is a 40 y.o. year old very pleasant female patient who presents for/with See problem oriented charting ROS- no hypoglycemia, no polyuria, no dry mouth, no polydipsia.   Past Medical History-  Patient Active Problem List   Diagnosis Date Noted  . Leucocytosis 12/09/2015    Priority: High  . 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 Diabetes mellitus type II, controlled (HCC) 03/25/2012    Priority: High  . Hyperprolactinemia ([facility]) 03/25/2012    Priority: High  . 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 Migraine headache 03/25/2012    Priority: High  . Hyperlipidemia 08/29/2016    Priority: Medium  . Elevated uric acid in blood 05/10/2015    Priority: Medium  . Anemia 01/01/2014    Priority: Medium  . Insomnia 01/04/2013    Priority: Medium  . Polycystic ovaries 03/25/2012    Priority: Medium  . Adult body mass index 37.0-37.9 03/25/2012    Priority: Low  . Cushing's syndrome-iatrogenic 03/25/2012    Priority: Low  . Allergic rhinitis due to pollen 03/25/2012    Priority: Low  . GERD (gastroesophageal reflux disease) 03/25/2012    Priority: Low    Medications- reviewed and updated Current Outpatient Prescriptions  Medication Sig Dispense Refill  . Bepotastine Besilate (BEPREVE) 1.5 % SOLN Place 1 drop into both eyes as needed (for allergies).     . cetirizine (ZYRTEC) 10 MG tablet Take 10 mg by mouth at bedtime as needed for allergies.     . clonazePAM (KLONOPIN) 0.5 MG tablet Take 0.5 tablets (0.25 mg total) by mouth at bedtime as needed for anxiety. 60 tablet 1  . cyclobenzaprine (FLEXERIL) 10 MG tablet Take 1 tablet (10 mg total) by mouth at bedtime. 30 tablet 5  . Diclofenac Potassium 50 MG PACK Take 50 mg by mouth as needed (for migraines).    . eletriptan (RELPAX) 40 MG tablet Take 1 tablet (40 mg total) by mouth as needed for migraine or headache. May repeat in 2 hours if headache persists or  recurs. 10 tablet 11  . Fe Fum-Vit C-Vit B12-FA (TRIGELS-F) 460-60-0.01-1 MG CAPS capsule Take 1 capsule by mouth every other day.    Marland Kitchen HYDROcodone-acetaminophen (NORCO/VICODIN) 5-325 MG tablet Take 1 tablet by mouth every 6 (six) hours as needed for moderate pain. 10 tablet 0  . ibuprofen (ADVIL) 200 MG tablet Take 3 tablets (600 mg total) by mouth every 6 (six) hours as needed. 30 tablet 0  . Multiple Vitamin (MULITIVITAMIN WITH MINERALS) TABS Take 1 tablet by mouth daily.    . Norethindrone-Ethinyl Estradiol-Fe Biphas (LO LOESTRIN FE) 1 MG-10 MCG / 10 MCG tablet Take 1 tablet by mouth daily.    . nortriptyline (PAMELOR) 10 MG capsule Take 10 mg by mouth at bedtime.    Marland Kitchen omeprazole (PRILOSEC) 20 MG capsule Take 20 mg by mouth at bedtime.     . ondansetron (ZOFRAN) 4 MG tablet Take 1 tablet (4 mg total) by mouth every 8 (eight) hours as needed for nausea or vomiting. 20 tablet 5  . propranolol ER (INDERAL LA) 80 MG 24 hr capsule Take 1 capsule (80 mg total) by mouth daily. (Patient taking differently: Take 80 mg by mouth at bedtime. ) 30 capsule 11  . sulfaSALAzine (AZULFIDINE) 500 MG tablet Take 1,000 mg by mouth 2 (two) times daily. Takes at 12 noon and midnight     No current facility-administered medications for this visit.  Objective: BP 124/88 (BP Location: Left Arm, Patient Position: Sitting, Cuff Size: Large)   Pulse 89   Temp 98.1 F (36.7 C) (Oral)   Ht 5' 2.5" (1.588 m)   Wt 226 lb 12.8 oz (102.9 kg)   SpO2 94%   BMI 40.82 kg/m  Gen: NAD, resting comfortably Anxious before we discussed do not have to use medication  Diabetic Foot Exam - Simple   Simple Foot Form Diabetic Foot exam was performed with the following findings:  Yes 09/27/2016  9:07 PM  Visual Inspection No deformities, no ulcerations, no other skin breakdown bilaterally:  Yes Sensation Testing Intact to touch and monofilament testing bilaterally:  Yes Pulse Check Posterior Tibialis and Dorsalis pulse  intact bilaterally:  Yes Comments      Assessment/Plan:  Diabetes mellitus type II, controlled (HCC) S: well controlled on no meds, but this is new diagnosis so we had patient in to discuss options CBGs- has a meter- is going to check at home Exercise and diet- very limited exercise, admits to some poor dietary habits Lab Results  Component Value Date   HGBA1C 6.7 (H) 09/12/2016   HGBA1C 6.7 07/08/2015   HGBA1C 6.3 12/03/2014   A/P: extended counseling today on lifestyle choices to help control diabetes- declines diabetes education at present but may send me a mychart message if she changes her mind. We discussed metformin but apparently even on 250mg  BID she had severe diarrhea in past. History of some low blood sugars so does not want to try amaryl. Ultimately we opted for lifestyle choices- focusing on 50% lunch and dinner veggies, focusingon whole grains for her carbs, focusing on water only  For beverages, and slowly working on exercise up to 150 minutes a week- perhaps 30 minutes a week of yoga broken up to start. We also discussed diabetes health maintenance and fortunately largely up to date- at next eye exam will inform them now diabetic. a1c goal 7 or less   Return in about 6 months (around 03/28/2017) for follow up- or sooner if needed.  The duration of face-to-face time during this visit was greater than 20 minutes. Greater than 50% of this time was spent in counseling as noted above  Return precautions advised.  05/28/2017, MD

## 2016-09-27 NOTE — Assessment & Plan Note (Addendum)
S: well controlled on no meds, but this is new diagnosis so we had patient in to discuss options CBGs- has a meter- is going to check at home Exercise and diet- very limited exercise, admits to some poor dietary habits Lab Results  Component Value Date   HGBA1C 6.7 (H) 09/12/2016   HGBA1C 6.7 07/08/2015   HGBA1C 6.3 12/03/2014   A/P: extended counseling today on lifestyle choices to help control diabetes- declines diabetes education at present but may send me a mychart message if she changes her mind. We discussed metformin but apparently even on 250mg  BID she had severe diarrhea in past. History of some low blood sugars so does not want to try amaryl. Ultimately we opted for lifestyle choices- focusing on 50% lunch and dinner veggies, focusingon whole grains for her carbs, focusing on water only  For beverages, and slowly working on exercise up to 150 minutes a week- perhaps 30 minutes a week of yoga broken up to start. We also discussed diabetes health maintenance and fortunately largely up to date- at next eye exam will inform them now diabetic. a1c goal 7 or less

## 2016-09-27 NOTE — Progress Notes (Signed)
Pre visit review using our clinic review tool, if applicable. No additional management support is needed unless otherwise documented below in the visit note. 

## 2016-09-29 NOTE — Telephone Encounter (Signed)
Last Visit: 07/12/16 Next Visit: due February 2018 Labs: 09/12/16 mild anemia   Okay to refill SSZ?

## 2016-09-29 NOTE — Telephone Encounter (Signed)
ok 

## 2016-10-05 ENCOUNTER — Encounter: Payer: Self-pay | Admitting: Family Medicine

## 2016-10-26 ENCOUNTER — Telehealth: Payer: Self-pay

## 2016-10-26 NOTE — Telephone Encounter (Signed)
Called patient and left a voicemail message asking for a return phone call in regards to [facility] paperwork dropped off here at the office. Patient was not seen for condition and patient was offered an appointment at the time. Paperwork can not be completed as requested due to patient not being seen. She was offered a work note at the time also.

## 2016-10-27 ENCOUNTER — Ambulatory Visit: Payer: BLUE CROSS/BLUE SHIELD | Admitting: Adult Health

## 2016-10-27 NOTE — Telephone Encounter (Signed)
Asher Muir- My understanding last time I read the FMLA was that patient had to be seen for the issue. Can you please verify with the paperwork? And if that is the case- tell patient it is not because we do not WANT to fill this out but because she does not qualify based on the fact she was not seen.

## 2016-10-27 NOTE — Telephone Encounter (Signed)
Pt states her work requires her to do FMLA paperwork if out more than 5 days.  She was out 6 days with the laryngitis /virus she had sent a message on my chart about. Pt states had she known she was going to be out that long, she would have made an appointment.  Pt states she no longer has that issue now and does not see the point of having to come in now. A dr note will not be enough, she will get an occurrence.   Ok to leave message, pt works 2:30 pm to 11pm and cannot take calls.  Ok to answer on mychart as well.

## 2016-10-28 ENCOUNTER — Encounter: Payer: Self-pay | Admitting: Adult Health

## 2016-10-28 NOTE — Telephone Encounter (Signed)
Called and left a voicemail asking for a return phone call 

## 2016-11-01 ENCOUNTER — Encounter: Payer: Self-pay | Admitting: Family Medicine

## 2016-11-01 NOTE — Telephone Encounter (Signed)
Called and left a voicemail message asking for a return phone call 

## 2016-11-02 NOTE — Telephone Encounter (Signed)
Patient sent a My Chart message stating she understands that we can't fill out her paperwork because she wasn't seen

## 2016-11-14 ENCOUNTER — Ambulatory Visit (INDEPENDENT_AMBULATORY_CARE_PROVIDER_SITE_OTHER): Payer: BLUE CROSS/BLUE SHIELD | Admitting: Adult Health

## 2016-11-14 ENCOUNTER — Encounter: Payer: Self-pay | Admitting: Adult Health

## 2016-11-14 VITALS — BP 153/102 | HR 81 | Ht 62.5 in | Wt 222.4 lb

## 2016-11-14 DIAGNOSIS — G43009 Migraine without aura, not intractable, without status migrainosus: Secondary | ICD-10-CM

## 2016-11-14 MED ORDER — HYDROCODONE-ACETAMINOPHEN 5-325 MG PO TABS: 1.0000 | ORAL_TABLET | Freq: Four times a day (QID) | ORAL | 0 refills | Status: DC | PRN

## 2016-11-14 MED ORDER — NORTRIPTYLINE HCL 10 MG PO CAPS: 30.0000 mg | ORAL_CAPSULE | Freq: Every day | ORAL | 5 refills | Status: DC
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
## 2016-11-14 NOTE — Patient Instructions (Signed)
Increase nortriptyline to 30 mg at bedtime If your symptoms worsen or you develop new symptoms please let us know.

## 2016-12-07 NOTE — Progress Notes (Signed)
I reviewed note and agree with plan.   [facility] R. Zayneb Baucum, MD  Certified in Neurology, Neurophysiology and Neuroimaging  [facility] [address], [address] [place], [place] [address] [phone]   

## 2016-12-13 ENCOUNTER — Encounter: Payer: Self-pay | Admitting: Family Medicine

## 2016-12-13 ENCOUNTER — Other Ambulatory Visit: Payer: Self-pay | Admitting: Adult Health

## 2016-12-13 ENCOUNTER — Encounter: Payer: Self-pay | Admitting: Adult Health

## 2016-12-13 MED ORDER — ONDANSETRON HCL 4 MG PO TABS
4.0000 mg | ORAL_TABLET | Freq: Three times a day (TID) | ORAL | 5 refills | Status: DC | PRN
Start: 2016-12-13 — End: 2017-12-26

## 2016-12-13 MED ORDER — HYDROCODONE-ACETAMINOPHEN 5-325 MG PO TABS: 1.0000 | ORAL_TABLET | Freq: Four times a day (QID) | ORAL | 0 refills | Status: DC | PRN

## 2016-12-13 NOTE — Telephone Encounter (Signed)
Replied by my chart that prescription ready and up front for pick up.  Electronics engineer.

## 2016-12-13 NOTE — Telephone Encounter (Signed)
LMVM for pt on her mobile.  Checking to see how the increase of nortriptyline of 3 tabs (30mg  po qhs) is doing.  Received email about refill of zofran, also ? vicodin refill.  Please call back.

## 2016-12-15 ENCOUNTER — Encounter: Payer: Self-pay | Admitting: Adult Health

## 2016-12-19 ENCOUNTER — Other Ambulatory Visit: Payer: Self-pay

## 2016-12-19 DIAGNOSIS — E119 Type 2 diabetes mellitus without complications: Secondary | ICD-10-CM

## 2016-12-21 ENCOUNTER — Other Ambulatory Visit (INDEPENDENT_AMBULATORY_CARE_PROVIDER_SITE_OTHER): Payer: BLUE CROSS/BLUE SHIELD

## 2016-12-21 DIAGNOSIS — E119 Type 2 diabetes mellitus without complications: Secondary | ICD-10-CM | POA: Diagnosis not present

## 2016-12-21 DIAGNOSIS — Z79899 Other long term (current) drug therapy: Secondary | ICD-10-CM | POA: Diagnosis not present

## 2016-12-21 LAB — BASIC METABOLIC PANEL
BUN: 8 mg/dL (ref 6–23)
CALCIUM: 9.3 mg/dL (ref 8.4–10.5)
CO2: 24 mEq/L (ref 19–32)
CREATININE: 0.66 mg/dL (ref 0.40–1.20)
Chloride: 103 mEq/L (ref 96–112)
GFR: 104.56 mL/min (ref >60.00)
GLUCOSE: 154 mg/dL — AB (ref 70–99)
POTASSIUM: 4.6 meq/L (ref 3.5–5.1)
Sodium: 137 mEq/L (ref 135–145)

## 2016-12-21 LAB — CBC WITH DIFFERENTIAL/PLATELET
Basophils Absolute: 0 10*3/uL (ref 0.0–0.1)
Basophils Relative: 0.3 % (ref 0.0–3.0)
EOS ABS: 0.2 10*3/uL (ref 0.0–0.7)
EOS PCT: 1.5 % (ref 0.0–5.0)
HCT: 34.2 % — ABNORMAL LOW (ref 36.0–46.0)
Hemoglobin: 10.6 g/dL — ABNORMAL LOW (ref 12.0–15.0)
LYMPHS ABS: 3.1 10*3/uL (ref 0.7–4.0)
Lymphocytes Relative: 22.6 % (ref 12.0–46.0)
MCHC: 30.9 g/dL (ref 30.0–36.0)
MCV: 68.9 fl — ABNORMAL LOW (ref 78.0–100.0)
MONO ABS: 0.7 10*3/uL (ref 0.1–1.0)
Monocytes Relative: 4.9 % (ref 3.0–12.0)
NEUTROS PCT: 70.7 % (ref 43.0–77.0)
Neutro Abs: 9.6 10*3/uL — ABNORMAL HIGH (ref 1.4–7.7)
Platelets: 438 10*3/uL — ABNORMAL HIGH (ref 150.0–400.0)
RBC: 4.97 Mil/uL (ref 3.87–5.11)
RDW: 17.9 % — ABNORMAL HIGH (ref 11.5–15.5)
WBC: 13.6 10*3/uL — ABNORMAL HIGH (ref 4.0–10.5)

## 2016-12-21 LAB — HEPATIC FUNCTION PANEL
ALT: 12 U/L (ref 0–35)
AST: 14 U/L (ref 0–37)
Albumin: 4.5 g/dL (ref 3.5–5.2)
Alkaline Phosphatase: 97 U/L (ref 39–117)
BILIRUBIN TOTAL: 0.4 mg/dL (ref 0.2–1.2)
Bilirubin, Direct: 0.1 mg/dL (ref 0.0–0.3)
Total Protein: 7 g/dL (ref 6.0–8.3)

## 2016-12-21 LAB — HEMOGLOBIN A1C: HEMOGLOBIN A1C: 7.1 % — AB (ref 4.6–6.5)

## 2016-12-21 NOTE — Progress Notes (Signed)
Labs are stable.

## 2017-01-03 ENCOUNTER — Telehealth: Payer: Self-pay | Admitting: Rheumatology

## 2017-01-03 NOTE — Telephone Encounter (Signed)
Patient is experiencing pain and swelling in her joints (more so than usual). It is mainly in her knees, ankles and her fingers have also started to swell. She is currently taking sulfasalazine and her A1C blood sugar has spiked and she doesn't know if this would be related? Patient is requesting a call back about her symptoms she is having.

## 2017-01-04 NOTE — Telephone Encounter (Signed)
Patient was offered ultrasound appointment for today and states she is unable to make it due to work. The next available appointment for an ultrasound is February 08, 2017 and patient is willing to come for the ultrasound but states she only has 2 weeks left of the SSZ and she is alarmed by what her Hgb A1C is doing and will be taking herself off of the SSZ.

## 2017-01-04 NOTE — Telephone Encounter (Signed)
Please schedule ultrasound of bilateral hands to see if there is active synovitis in her hands. If not I would prefer not to change her medication.

## 2017-01-04 NOTE — Telephone Encounter (Signed)
Ok then sch a fu visit as well after the [place] visit as we will have to change tt

## 2017-01-04 NOTE — Telephone Encounter (Signed)
Patient states she is still having swelling in her ankles, knees and her elbows from time to times and most recently her fingers. She is currently on SSZ. Patient states her Hgb A1C is going up and she has cut sugar out of her diet. Patient would like to know if there is another medication that would be more beneficial for her and her RA as well as keep her Hgb A1C lowered.

## 2017-01-05 NOTE — Telephone Encounter (Signed)
Patient has been scheduled for a ultrasound and a follow up visit on 01/25/17 @ 2:15 pm. Patient advised.

## 2017-01-10 ENCOUNTER — Encounter: Payer: Self-pay | Admitting: Adult Health

## 2017-01-10 ENCOUNTER — Other Ambulatory Visit: Payer: Self-pay | Admitting: Adult Health

## 2017-01-10 MED ORDER — HYDROCODONE-ACETAMINOPHEN 5-325 MG PO TABS: 1.0000 | ORAL_TABLET | Freq: Four times a day (QID) | ORAL | 0 refills | Status: DC | PRN

## 2017-01-10 NOTE — Progress Notes (Signed)
Refill for Hydrocodone completed. Given to [facility].  [place] Drug registry checked- patient not receiving any other controlled substances.

## 2017-01-11 NOTE — Progress Notes (Signed)
LMVM for pt mobile (ok per DPR) that prescription ready for pick up.  [id number] [phone]. Place up front.

## 2017-01-18 DIAGNOSIS — M722 Plantar fascial fibromatosis: Secondary | ICD-10-CM | POA: Insufficient documentation

## 2017-01-18 DIAGNOSIS — M19071 Primary osteoarthritis, right ankle and foot: Secondary | ICD-10-CM | POA: Insufficient documentation

## 2017-01-18 DIAGNOSIS — M19041 Primary osteoarthritis, right hand: Secondary | ICD-10-CM | POA: Insufficient documentation

## 2017-01-18 DIAGNOSIS — Z79899 Other long term (current) drug therapy: Secondary | ICD-10-CM | POA: Insufficient documentation

## 2017-01-18 DIAGNOSIS — M19042 Primary osteoarthritis, left hand: Secondary | ICD-10-CM

## 2017-01-18 DIAGNOSIS — M170 Bilateral primary osteoarthritis of knee: Secondary | ICD-10-CM | POA: Insufficient documentation

## 2017-01-18 DIAGNOSIS — M19072 Primary osteoarthritis, left ankle and foot: Secondary | ICD-10-CM

## 2017-01-18 NOTE — Progress Notes (Signed)
Office Visit Note  Patient: [name]             Date of Birth: Jun 07, 1976           MRN: [id number]             PCP: Garret Reddish, MD Referring: Marin Olp, MD Visit Date: 01/25/2017 Occupation: '@GUAROCC'$ @    Subjective:  Pain hands   History of Present Illness: [name] is a 41 y.o. female with history of some seronegative inflammatory arthritis. She states she continues to have pain and stiffness in her hands and other joints. She was concerned that sulfasalazine may be contribution doing to her elevated hemoglobin A1c. She states she tried to decrease the dose of sulfasalazine but then her arthritis got worse and she had to increase the dose of sulfasalazine. Despite being on the full dose of sulfasalazine she continues to have pain and stiffness in her bilateral hands and wrist. She reports swelling in her bilateral knee joints and discomfort in her feet. She states she's been also experiencing paresthesias in her hands. She does work on the computer at work.  Activities of Daily Living:  Patient reports morning stiffness for 15 minutes.   Patient Reports nocturnal pain.  Difficulty dressing/grooming: Denies Difficulty climbing stairs: Reports Difficulty getting out of chair: Denies Difficulty using hands for taps, buttons, cutlery, and/or writing: Denies   Review of Systems  Constitutional: Positive for fatigue. Negative for night sweats, weight gain, weight loss and weakness.  HENT: Positive for mouth dryness. Negative for mouth sores, trouble swallowing, trouble swallowing and nose dryness.        Related to medicines  Eyes: Negative for pain, redness, visual disturbance and dryness.  Respiratory: Negative for cough, shortness of breath and difficulty breathing.   Cardiovascular: Negative for chest pain, palpitations, hypertension, irregular heartbeat and swelling in legs/feet.  Gastrointestinal: Negative for blood in stool,  constipation and diarrhea.  Endocrine: Negative for increased urination.  Genitourinary: Negative for vaginal dryness.  Musculoskeletal: Positive for arthralgias, joint pain, joint swelling and morning stiffness. Negative for myalgias, muscle weakness, muscle tenderness and myalgias.  Skin: Positive for hair loss. Negative for color change, rash, skin tightness, ulcers and sensitivity to sunlight.       Alopecia totalis  Allergic/Immunologic: Negative for susceptible to infections.  Neurological: Negative for dizziness, memory loss and night sweats.  Hematological: Negative for swollen glands.  Psychiatric/Behavioral: Positive for sleep disturbance. Negative for depressed mood. The patient is not nervous/anxious.     PMFS History:  Patient Active Problem List   Diagnosis Date Noted  . Primary osteoarthritis of both hands 01/18/2017  . Primary osteoarthritis of both knees 01/18/2017  . Primary osteoarthritis of both feet 01/18/2017  . Plantar fasciitis 01/18/2017  . High risk medication use 01/18/2017  . Hyperlipidemia 08/29/2016  . Leucocytosis 12/09/2015  . Inflammatory arthritis 12/09/2015  . Elevated uric acid in blood 05/10/2015  . Anemia 01/01/2014  . Insomnia 01/04/2013  . Polycystic ovaries 03/25/2012  . Diabetes mellitus type II, controlled ([place]) 03/25/2012  . Adult body mass index 37.0-37.9 03/25/2012  . Cushing's syndrome-iatrogenic 03/25/2012  . Hyperprolactinemia ([place]) 03/25/2012  . 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 Allergic rhinitis due to pollen 03/25/2012  . GERD (gastroesophageal reflux disease) 03/25/2012  . Migraine headache 03/25/2012    Past Medical History:  Diagnosis Date  . Allergy   . Alopecia areata totalis   . Anemia   . Anxiety   . Arthritis   .  Depression    meds for 1 year around loss of father  . GERD (gastroesophageal reflux disease)   . Hypertension   . Migraine   . Polycystic ovary disease     Family History  Problem Relation  Age of Onset  . Hypertension Mother   . Hyperlipidemia Mother   . Breast cancer Mother     52  . Hyperlipidemia Maternal Grandmother   . Hypertension Maternal Grandmother   . Heart failure Maternal Grandmother   . Brain cancer Father     glioblastoma  . Hyperlipidemia Father   . Hypertension Sister    Past Surgical History:  Procedure Laterality Date  . CHOLECYSTECTOMY    . DILATATION & CURETTAGE/HYSTEROSCOPY WITH MYOSURE N/A 08/26/2016   #2  Procedure: [place];  Surgeon: Crawford Givens, MD;  Location: [facility];  Service: Gynecology;  Laterality: N/A;  . DILATION AND CURETTAGE OF UTERUS     #1  . NASAL SEPTUM SURGERY    . WISDOM TOOTH EXTRACTION     Social History   Social History Narrative    Married. No kids. 2 dogs, 3 cats. Lives at home with her husband.      Works for [employer]. CNA on renal floor.      Objective: Vital Signs: BP 126/74   Pulse 82   Resp 16   LMP 01/12/2017    Physical Exam  Constitutional: She is oriented to person, place, and time. She appears well-developed and well-nourished.  HENT:  Head: Normocephalic and atraumatic.  Eyes: Conjunctivae and EOM are normal.  Neck: Normal range of motion.  Cardiovascular: Normal rate, regular rhythm, normal heart sounds and intact distal pulses.   Pulmonary/Chest: Effort normal and breath sounds normal.  Abdominal: Soft. Bowel sounds are normal.  Lymphadenopathy:    She has no cervical adenopathy.  Neurological: She is alert and oriented to person, place, and time.  Skin: Skin is warm and dry. Capillary refill takes less than 2 seconds.  Alopecia  Psychiatric: She has a normal mood and affect. Her behavior is normal.  Nursing note and vitals reviewed.    Musculoskeletal Exam: C-spine and thoracic lumbar spine good range of motion. Shoulder joints although joints wrist joint MCPs PIPs DIPs with good range of motion with no synovitis noted  tenderness on examination. Hip joints knee joints ankles MTPs PIPs DIPs with good range of motion with no synovitis on examination today.  CDAI Exam: CDAI Homunculus Exam:   Joint Counts:  CDAI Tender Joint count: 0 CDAI Swollen Joint count: 0  Global Assessments:  Patient Global Assessment: 5 Provider Global Assessment: 2  CDAI Calculated Score: 7    Investigation: Findings:  06/14/2013 We obtained an x-ray of her left elbow, 1 view, which was unremarkable without any joint space narrowing or erosive changes.  Bilateral foot x-rays, 2 views, showed bilateral PIP and DIP narrowing without erosive changes.  There was some MTP subluxation and posterior and inferior calcaneal spurs.  Her labs from August 2014:  CBC was normal, sed rate was slightly elevated at 28, her WBC count was also slightly elevated at 10.6.  She had microcytic anemia with hemoglobin of 11.8.  Comprehensive metabolic panel was normal, CK, TSH, hep panel was normal limits, immunoglobin, SPEP, UA, ACE level, rheumatoid factor, CCP, ANA, ENA, compliment HLA-B27 were all within normal limits.   07/25/2013 :  Ultrasound examination of bilateral feet was performed under EULAR recommendations.  We used 12 MHz transducer,  grayscale and power Doppler to evaluate her bilateral feet.  Bilateral 1st, 4th and 5th MTP joints, tibiotalar joint and plantar fascia was evaluated both dorsal and volar aspects.  The findings were there was mild hyperemia in the right 1st MTP joint dorsal aspect only.  All other MTPs did not show any synovitis, tenosynovitis or synovial thickening.  There was no plantar fasciitis and there was no effusion in the tibiotalar joint and no synovitis was noticed.  These findings were within normal limits.  Mild hyperemia and MTP joint could be overuse syndrome.  I did not see any synovitis.  No change in treatment was suggested at this time.    Lab on 12/21/2016  Component Date Value Ref Range Status  . Sodium  12/21/2016 137  135 - 145 mEq/L Final  . Potassium 12/21/2016 4.6  3.5 - 5.1 mEq/L Final  . Chloride 12/21/2016 103  96 - 112 mEq/L Final  . CO2 12/21/2016 24  19 - 32 mEq/L Final  . Glucose, Bld 12/21/2016 154* 70 - 99 mg/dL Final  . BUN 12/21/2016 8  6 - 23 mg/dL Final  . Creatinine, Ser 12/21/2016 0.66  0.40 - 1.20 mg/dL Final  . Calcium 12/21/2016 9.3  8.4 - 10.5 mg/dL Final  . GFR 12/21/2016 104.56  >60.00 mL/min Final  . WBC 12/21/2016 13.6* 4.0 - 10.5 K/uL Final  . RBC 12/21/2016 4.97  3.87 - 5.11 Mil/uL Final  . Hemoglobin 12/21/2016 10.6* 12.0 - 15.0 g/dL Final  . HCT 12/21/2016 34.2* 36.0 - 46.0 % Final  . MCV 12/21/2016 68.9 Repeated and verified X2.* 78.0 - 100.0 fl Final  . MCHC 12/21/2016 30.9  30.0 - 36.0 g/dL Final  . RDW 12/21/2016 17.9* 11.5 - 15.5 % Final  . Platelets 12/21/2016 438.0* 150.0 - 400.0 K/uL Final  . Neutrophils Relative % 12/21/2016 70.7  43.0 - 77.0 % Final  . Lymphocytes Relative 12/21/2016 22.6  12.0 - 46.0 % Final  . Monocytes Relative 12/21/2016 4.9  3.0 - 12.0 % Final  . Eosinophils Relative 12/21/2016 1.5  0.0 - 5.0 % Final  . Basophils Relative 12/21/2016 0.3  0.0 - 3.0 % Final  . Neutro Abs 12/21/2016 9.6* 1.4 - 7.7 K/uL Final  . Lymphs Abs 12/21/2016 3.1  0.7 - 4.0 K/uL Final  . Monocytes Absolute 12/21/2016 0.7  0.1 - 1.0 K/uL Final  . Eosinophils Absolute 12/21/2016 0.2  0.0 - 0.7 K/uL Final  . Basophils Absolute 12/21/2016 0.0  0.0 - 0.1 K/uL Final  . Total Bilirubin 12/21/2016 0.4  0.2 - 1.2 mg/dL Final  . Bilirubin, Direct 12/21/2016 0.1  0.0 - 0.3 mg/dL Final  . Alkaline Phosphatase 12/21/2016 97  39 - 117 U/L Final  . AST 12/21/2016 14  0 - 37 U/L Final  . ALT 12/21/2016 12  0 - 35 U/L Final  . Total Protein 12/21/2016 7.0  6.0 - 8.3 g/dL Final  . Albumin 12/21/2016 4.5  3.5 - 5.2 g/dL Final  . Hgb A1c MFr Bld 12/21/2016 7.1* 4.6 - 6.5 % Final     Imaging: [place] Extrem Up Bilat Comp  Result Date: 01/25/2017 Ultrasound examination  of bilateral hands was performed per EULAR recommendations. Using 12 MHz transducer, grayscale and power Doppler bilateral second, third, and fifth MCP joints and bilateral wrist joints both dorsal and volar aspects were evaluated to look for synovitis or tenosynovitis. The findings were there was no synovitis in right hand MCPs and wrists joint. Mild synovitis was noted  in left second and third MCP joint and lateral aspect of the left wrist there was no tenosynovitis on ultrasound examination. No erosions were noted. Right median nerve was 0.10 cm squares which was within normal limits and left median nerve was 0.09 cm squares which was within normal limits Impression: Ultrasound examination revealed mild synovitis in the left second and third MCP joints and wrists joint. Median nerves are within normal limits.   Speciality Comments: No specialty comments available.    Procedures:  No procedures performed Allergies: Darvocet [propoxyphene n-acetaminophen]; Felodipine er; Prednisone; Imitrex [sumatriptan succinate]; and Latex   Assessment / Plan:     Visit Diagnoses:   Inflammatory arthritis - Sero negative. Patient continues to have discomfort and pain in her hands knees and ankles. She had no synovitis on examination today. The ultrasound examination revealed only mild synovitis in left second and third MCP joint. We had detailed discussion regarding her situation. She believes that sulfasalazine is making her hemoglobin A1c go. She would like to lower the dose or discontinue sulfasalazine eventually. Different treatment options and their side effects were discussed. As she has alopecia totalis we decided not to use methotrexate. I discussed the option of adding Plaquenil to sulfasalazine currently and if she does well she may try coming off Azulfidine. She was in agreement handout was given on Plaquenil consent was taken and we will give her a prescription for Plaquenil 200 mg 1 tablet by mouth  twice a day Monday to Friday. She will need labs in 1 month then every 3 months to monitor for drug toxicity. She will need eye examination baseline and then on a yearly basis to monitor for ocular toxicity.  High risk medication use - Azulfidine EN 500 mg 2 tablets twice a day. She has chronic elevation of WBC count and anemia. Otherwise the labs have been stable.  Primary osteoarthritis of both hands: She has mild osteoarthritic changes.  Primary osteoarthritis of both knees: She does have chronic pain in her knee joints I did not see any synovitis on examination today  Primary osteoarthritis of both feet: She complains of discomfort in her bilateral feet no synovitis was noted today  Plantar fasciitis: According to patient the plantar fasciitis is not very active now.    Adult body mass index 37.0-37.9  Elevated uric acid in blood  Controlled type 2 diabetes mellitus without complication, without long-term current use of insulin ([facility])  Hyperlipidemia, unspecified hyperlipidemia type   Also has Polycystic ovaries; Diabetes mellitus type II, controlled ([place]); Cushing's syndrome-iatrogenic; Hyperprolactinemia ([place]); Allergic rhinitis due to pollen; GERD (gastroesophageal reflux disease); Migraine headache; Insomnia; Anemia; Leucocytosis; and  Hyperlipidemia; on her problem list   Orders: Orders Placed This Encounter  Procedures  . [place] Extrem Up Bilat Comp   No orders of the defined types were placed in this encounter.   Face-to-face time spent with patient was 40 minutes. 50% of time was spent in counseling and coordination of care.  Follow-Up Instructions: Return in about 3 months (around 04/26/2017) for Inflammatory arthritis.   Bo Merino, MD  Note - This record has been created using Editor, commissioning.  Chart creation errors have been sought, but may not always  have been located. Such creation errors do not reflect on  the standard of medical care.

## 2017-01-25 ENCOUNTER — Inpatient Hospital Stay (INDEPENDENT_AMBULATORY_CARE_PROVIDER_SITE_OTHER): Payer: Self-pay

## 2017-01-25 ENCOUNTER — Ambulatory Visit (INDEPENDENT_AMBULATORY_CARE_PROVIDER_SITE_OTHER): Payer: BLUE CROSS/BLUE SHIELD | Admitting: Rheumatology

## 2017-01-25 ENCOUNTER — Encounter: Payer: Self-pay | Admitting: Rheumatology

## 2017-01-25 VITALS — BP 126/74 | HR 82 | Resp 16

## 2017-01-25 DIAGNOSIS — M722 Plantar fascial fibromatosis: Secondary | ICD-10-CM | POA: Diagnosis not present

## 2017-01-25 DIAGNOSIS — E790 Hyperuricemia without signs of inflammatory arthritis and tophaceous disease: Secondary | ICD-10-CM

## 2017-01-25 DIAGNOSIS — M170 Bilateral primary osteoarthritis of knee: Secondary | ICD-10-CM | POA: Diagnosis not present

## 2017-01-25 DIAGNOSIS — Z6837 Body mass index (BMI) 37.0-37.9, adult: Secondary | ICD-10-CM

## 2017-01-25 DIAGNOSIS — M19041 Primary osteoarthritis, right hand: Secondary | ICD-10-CM | POA: Diagnosis not present

## 2017-01-25 DIAGNOSIS — E785 Hyperlipidemia, unspecified: Secondary | ICD-10-CM | POA: Diagnosis not present

## 2017-01-25 DIAGNOSIS — M1990 Unspecified osteoarthritis, unspecified site: Secondary | ICD-10-CM

## 2017-01-25 DIAGNOSIS — L630 Alopecia (capitis) totalis: Secondary | ICD-10-CM | POA: Diagnosis not present

## 2017-01-25 DIAGNOSIS — M79642 Pain in left hand: Secondary | ICD-10-CM

## 2017-01-25 DIAGNOSIS — Z79899 Other long term (current) drug therapy: Secondary | ICD-10-CM | POA: Diagnosis not present

## 2017-01-25 DIAGNOSIS — M79641 Pain in right hand: Secondary | ICD-10-CM

## 2017-01-25 DIAGNOSIS — M19072 Primary osteoarthritis, left ankle and foot: Secondary | ICD-10-CM

## 2017-01-25 DIAGNOSIS — M19071 Primary osteoarthritis, right ankle and foot: Secondary | ICD-10-CM | POA: Diagnosis not present

## 2017-01-25 DIAGNOSIS — E119 Type 2 diabetes mellitus without complications: Secondary | ICD-10-CM

## 2017-01-25 DIAGNOSIS — M19042 Primary osteoarthritis, left hand: Secondary | ICD-10-CM

## 2017-01-25 MED ORDER — HYDROXYCHLOROQUINE SULFATE 200 MG PO TABS: 200.0000 mg | ORAL_TABLET | Freq: Two times a day (BID) | ORAL | 2 refills | Status: DC

## 2017-01-25 MED ORDER — SULFASALAZINE 500 MG PO TABS: 1000.0000 mg | ORAL_TABLET | Freq: Two times a day (BID) | ORAL | 0 refills | Status: DC

## 2017-01-25 NOTE — Progress Notes (Signed)
Pharmacy Note  Subjective: Patient presents today to the [facility] to see Dr. Corliss Skains.  Patient seen by the pharmacist for counseling on hydroxychloroquine.    Objective: CMP Latest Ref Rng & Units 12/21/2016 09/12/2016 08/25/2016  Glucose 70 - 99 mg/dL 329(J) 188(C) 166(A)  BUN 6 - 23 mg/dL 8 10 10   Creatinine 0.40 - 1.20 mg/dL 6.30 1.60  Sodium 135 - 145 mEq/L 137 141 137  Potassium 3.5 - 5.1 mEq/L 4.6 4.5 4.5  Chloride 96 - 112 mEq/L 103 105 104  CO2 19 - 32 mEq/L 24 24 25   Calcium 8.4 - 10.5 mg/dL 9.3 9.1 9.1  Total Protein 6.0 - 8.3 g/dL 7.0 6.5 -  Total Bilirubin 0.2 - 1.2 mg/dL 0.4 0.2 -  Alkaline Phos 39 - 117 U/L 97 74 -  AST 0 - 37 U/L 14 11 -  ALT 0 - 35 U/L 12 8 -   CBC    Component Value Date/Time   WBC 13.6 (H) 12/21/2016 1344   RBC 4.97 12/21/2016 1344   HGB 10.6 (L) 12/21/2016 1344   HGB 14.0 04/08/2014 0947   HCT 34.2 (L) 12/21/2016 1344   HCT 42.7 04/08/2014 0947   PLT 438.0 (H) 12/21/2016 1344   PLT 391 04/08/2014 0947   MCV 68.9 Repeated and verified X2. (L) 12/21/2016 1344   MCV 80.4 04/08/2014 0947   MCH 26.5 08/25/2016 0950   MCHC 30.9 12/21/2016 1344   RDW 17.9 (H) 12/21/2016 1344   RDW 16.3 (H) 04/08/2014 0947   LYMPHSABS 3.1 12/21/2016 1344   LYMPHSABS 3.4 (H) 04/08/2014 0947   MONOABS 0.7 12/21/2016 1344   MONOABS 0.6 04/08/2014 0947   EOSABS 0.2 12/21/2016 1344   EOSABS 0.3 04/08/2014 0947   BASOSABS 0.0 12/21/2016 1344   BASOSABS 0.1 04/08/2014 0947    Assessment/Plan: Patient was prescribed hydroxychloroquine 200 mg BID Monday through Friday.  Patient was counseled on the purpose, proper use, and adverse effects of hydroxychloroquine including nausea/diarrhea, skin rash, headaches, and sun sensitivity.  Discussed importance of annual eye exams while on hydroxychloroquine to monitor to ocular toxicity and discussed importance of frequent laboratory monitoring.  Provided patient with eye exam form for baseline  ophthalmologic exam and standing lab instructions.  Provided patient with educational materials on hydroxychloroquine and answered all questions.  Patient consented to hydroxychloroquine.  Will upload consent in the media tab.    Monday, Pharm.D., BCPS Clinical Pharmacist Pager: [phone] Phone: [phone] 01/25/2017 3:43 PM

## 2017-01-25 NOTE — Patient Instructions (Addendum)
Standing Labs We placed an order today for your standing lab work.    Please come back and get your standing labs in 1 month then every 3 months.   We have open lab Monday through Friday from 8:30-11:30 AM and 1:30-4 PM at the office of Dr. Arbutus Ped, PA.   The office is located at [address], [address], [place], [place] [address] No appointment is necessary.   Labs are drawn by [employer] [facility] [employer].  You may receive a bill from [facility] for your lab work.    Hydroxychloroquine tablets What is this medicine? HYDROXYCHLOROQUINE (hye drox ee KLOR oh kwin) is used to treat rheumatoid arthritis and systemic lupus erythematosus. It is also used to treat malaria. This medicine may be used for other purposes; ask your health care provider or pharmacist if you have questions. COMMON BRAND NAME(S): Plaquenil, Quineprox What should I tell my health care provider before I take this medicine? They need to know if you have any of these conditions: -diabetes -eye disease, vision problems -G6PD deficiency -history of blood diseases -history of irregular heartbeat -if you often drink alcohol -kidney disease -liver disease -porphyria -psoriasis -seizures -an unusual or allergic reaction to chloroquine, hydroxychloroquine, other medicines, foods, dyes, or preservatives -pregnant or trying to get pregnant -breast-feeding How should I use this medicine? Take this medicine by mouth with a glass of water. Follow the directions on the prescription label. Avoid taking antacids within 4 hours of taking this medicine. It is best to separate these medicines by at least 4 hours. Do not cut, crush or chew this medicine. You can take it with or without food. If it upsets your stomach, take it with food. Take your medicine at regular intervals. Do not take your medicine more often than directed. Take all of your medicine as directed even if you think you are better. Do not skip doses or stop your  medicine early. Talk to your pediatrician regarding the use of this medicine in children. While this drug may be prescribed for selected conditions, precautions do apply. Overdosage: If you think you have taken too much of this medicine contact a poison control center or emergency room at once. NOTE: This medicine is only for you. Do not share this medicine with others. What if I miss a dose? If you miss a dose, take it as soon as you can. If it is almost time for your next dose, take only that dose. Do not take double or extra doses. What may interact with this medicine? Do not take this medicine with any of the following medications: -cisapride -dofetilide -dronedarone -live virus vaccines -penicillamine -pimozide -thioridazine -ziprasidone This medicine may also interact with the following medications: -ampicillin -antacids -cimetidine -cyclosporine -digoxin -medicines for diabetes, like insulin, glipizide, glyburide -medicines for seizures like carbamazepine, phenobarbital, phenytoin -mefloquine -methotrexate -other medicines that prolong the QT interval (cause an abnormal heart rhythm) -praziquantel This list may not describe all possible interactions. Give your health care provider a list of all the medicines, herbs, non-prescription drugs, or dietary supplements you use. Also tell them if you smoke, drink alcohol, or use illegal drugs. Some items may interact with your medicine. What should I watch for while using this medicine? Tell your doctor or healthcare professional if your symptoms do not start to get better or if they get worse. Avoid taking antacids within 4 hours of taking this medicine. It is best to separate these medicines by at least 4 hours. Tell your doctor or health  care professional right away if you have any change in your eyesight. Your vision and blood may be tested before and during use of this medicine. This medicine can make you more sensitive to the  sun. Keep out of the sun. If you cannot avoid being in the sun, wear protective clothing and use sunscreen. Do not use sun lamps or tanning beds/booths. What side effects may I notice from receiving this medicine? Side effects that you should report to your doctor or health care professional as soon as possible: -allergic reactions like skin rash, itching or hives, swelling of the face, lips, or tongue -changes in vision -decreased hearing or ringing of the ears -redness, blistering, peeling or loosening of the skin, including inside the mouth -seizures -sensitivity to light -signs and symptoms of a dangerous change in heartbeat or heart rhythm like chest pain; dizziness; fast or irregular heartbeat; palpitations; feeling faint or lightheaded, falls; breathing problems -signs and symptoms of liver injury like dark yellow or brown urine; general ill feeling or flu-like symptoms; light-colored stools; loss of appetite; nausea; right upper belly pain; unusually weak or tired; yellowing of the eyes or skin -signs and symptoms of low blood sugar such as feeling anxious; confusion; dizziness; increased hunger; unusually weak or tired; sweating; shakiness; cold; irritable; headache; blurred vision; fast heartbeat; loss of consciousness -uncontrollable head, mouth, neck, arm, or leg movements Side effects that usually do not require medical attention (report to your doctor or health care professional if they continue or are bothersome): -anxious -diarrhea -dizziness -hair loss -headache -irritable -loss of appetite -nausea, vomiting -stomach pain This list may not describe all possible side effects. Call your doctor for medical advice about side effects. You may report side effects to FDA at 1-800-FDA-1088. Where should I keep my medicine? Keep out of the reach of children. In children, this medicine can cause overdose with small doses. Store at room temperature between 15 and 30 degrees C (59 and  86 degrees F). Protect from moisture and light. Throw away any unused medicine after the expiration date. NOTE: This sheet is a summary. It may not cover all possible information. If you have questions about this medicine, talk to your doctor, pharmacist, or health care provider.  2018 Elsevier/Gold Standard (2016-05-25 14:16:15)   [facility] A Division of [facility] [address], [place], [place] [address] Telephone: [phone]  Fax: [phone]  Our mutual patient _____________________ (DOB: _____ / _____ / _____) is in need of a BASELINE hydroxychloroquine (Plaquenil) toxicity eye exam or FOLLOW UP Plaquenil eye exam.   Please fill out the form below and fax to our office when the Plaquenil eye exam is completed.   Thank you for your assistance in this matter.   Sincerely,   Pollyann Savoy, MD      Fredderick Severance, [name]   Date of Plaquenil Toxicity Eye Exam: _____ / _____ / _____  Plaquenil Toxicity Eye Exam Was:   Normal   ABNORMAL  Plaquenil Should Be:     Continued  DISCONTINUED  Date of Follow-up Plaquenil Eye Exam:  6 mo.          12 mo.     Other: ___ / ___ / ___    Practice Name:       Name of Eye Doctor:      ___________________________   ___________________________     Practice Phone Number:     Signature of Eye Doctor:  ___________________________              ___________________________ 

## 2017-01-26 ENCOUNTER — Telehealth: Payer: Self-pay | Admitting: Rheumatology

## 2017-01-26 NOTE — Telephone Encounter (Signed)
I ran drug interaction report from two sources (Facts and Comparison, UpToDate) and did not see an interaction with these two medications.  I also looked for reports of this interaction in PubMed medical database and did not find any results.  I spoke to Tobi Bastos, the pharmacist at [facility], who reports Clinical Pharmacology is stating concern about QT prolongation when adding hydroxychloroquine to nortriptyline.    I called patient to confirm she is taking nortriptyline and discuss this interaction with her.  I left a message asking patient to call me back.

## 2017-01-26 NOTE — Telephone Encounter (Signed)
I received a return phone call from patient.  Discussed the concern about hydroxychloroquine and nortriptyline and their effect on QT prolongation.  Advised that multiple drug interaction reports do not show an interaction and I do not see any reports of interaction when reviewing the medical database, but product labeling for hydroxychloroquine states "May also be associated with QT interval prolongation; ventricular arrhythmia and torsades de pointes have been reported (avoid concurrent use of other medications which may prolong the QT interval)."   Patient confirms she is still taking nortriptyline 30 mg QHS PRN.  Patient reports she did have an EKG in November (noted QTc 457).  Patient states she would like to try hydroxychloroquine, but she wants to see if her PCP can monitor her QTc at her next visit on 01/26/17.  She is going to contact their office and will let us know what she decides.

## 2017-01-26 NOTE — Telephone Encounter (Signed)
Tobi Bastos from [facility] called in regards to a drug interaction with a prescription that was prescribed yesterday.  CB#[phone].

## 2017-01-26 NOTE — Telephone Encounter (Signed)
Anna at [facility] states that their sytem shows a moderate interaction between the PLQ and the Nortriptyline. Interaction is QT prolongation. Is it okay to still give patient the PLQ prescription?

## 2017-01-27 ENCOUNTER — Telehealth: Payer: Self-pay | Admitting: *Deleted

## 2017-01-27 NOTE — Telephone Encounter (Signed)
[facility] sent a fax for prescription clarification on SSZ. Patient has been on SSZ DR/EC and prescription was sent for regular SSZ. Advised the pharmacist the prescription should be for SSZ DR/EC.

## 2017-02-01 ENCOUNTER — Encounter: Payer: Self-pay | Admitting: Rheumatology

## 2017-02-01 NOTE — Telephone Encounter (Signed)
ok 

## 2017-02-01 NOTE — Telephone Encounter (Signed)
Patient is currently on SSZ 1000 mg BID.  We considered adding hydroxychloroquine, but patient does not want to start the medication due to concern about QTc.  She is wanting to know if there are other options.  Discussed methotrexate with her.  We have avoided this in the past due to her alopecia, but patient is willing to try it.    Hepatitis panel: negative (06/14/2013) TB Gold: negative (04/12/2015) HIV: negative (07/11/15) Chest X-ray: "IMPRESSION: No acute cardiopulmonary disease."    Okay to proceed with methotrexate?  I will schedule patient to see me to review medication/consent.

## 2017-02-01 NOTE — Telephone Encounter (Signed)
Patient will come in 02/02/17 at 12:30 PM for methotrexate education.

## 2017-02-02 ENCOUNTER — Other Ambulatory Visit: Payer: Self-pay | Admitting: Radiology

## 2017-02-02 ENCOUNTER — Ambulatory Visit: Payer: BLUE CROSS/BLUE SHIELD | Admitting: Pharmacist

## 2017-02-02 DIAGNOSIS — Z79899 Other long term (current) drug therapy: Secondary | ICD-10-CM

## 2017-02-02 MED ORDER — METHOTREXATE 2.5 MG PO TABS: ORAL_TABLET | ORAL | 0 refills | Status: DC

## 2017-02-02 MED ORDER — FOLIC ACID 1 MG PO TABS: 2.0000 mg | ORAL_TABLET | Freq: Every day | ORAL | 3 refills | Status: DC

## 2017-02-02 NOTE — Progress Notes (Signed)
Pharmacy Note  Subjective: Patient presents today to the [facility] for discussion of medications.  She was unable to start hydroxychloroquine due to concern about interaction with nortriptyline.  Hydroxychloroquine prescription was cancelled at the pharmacy.  Patient seen by pharmacist for counseling on methotrexate.    Objective: CBC    Component Value Date/Time   WBC 13.6 (H) 12/21/2016 1344   RBC 4.97 12/21/2016 1344   HGB 10.6 (L) 12/21/2016 1344   HGB 14.0 04/08/2014 0947   HCT 34.2 (L) 12/21/2016 1344   HCT 42.7 04/08/2014 0947   PLT 438.0 (H) 12/21/2016 1344   PLT 391 04/08/2014 0947   MCV 68.9 Repeated and verified X2. (L) 12/21/2016 1344   MCV 80.4 04/08/2014 0947   MCH 26.5 08/25/2016 0950   MCHC 30.9 12/21/2016 1344   RDW 17.9 (H) 12/21/2016 1344   RDW 16.3 (H) 04/08/2014 0947   LYMPHSABS 3.1 12/21/2016 1344   LYMPHSABS 3.4 (H) 04/08/2014 0947   MONOABS 0.7 12/21/2016 1344   MONOABS 0.6 04/08/2014 0947   EOSABS 0.2 12/21/2016 1344   EOSABS 0.3 04/08/2014 0947   BASOSABS 0.0 12/21/2016 1344   BASOSABS 0.1 04/08/2014 0947   CMP     Component Value Date/Time   NA 137 12/21/2016 1344   NA 141 04/08/2014 0947   K 4.6 12/21/2016 1344   K 4.3 04/08/2014 0947   CL 103 12/21/2016 1344   CO2 24 12/21/2016 1344   CO2 26 04/08/2014 0947   GLUCOSE 154 (H) 12/21/2016 1344   GLUCOSE 164 (H) 04/08/2014 0947   BUN 8 12/21/2016 1344   BUN 10.2 04/08/2014 0947   CREATININE 0.66 12/21/2016 1344   CREATININE 0.68 04/23/2016 1608   CREATININE 0.8 04/08/2014 0947   CALCIUM 9.3 12/21/2016 1344   CALCIUM 9.6 04/08/2014 0947   PROT 7.0 12/21/2016 1344   PROT 7.8 04/08/2014 0947   ALBUMIN 4.5 12/21/2016 1344   ALBUMIN 4.0 04/08/2014 0947   AST 14 12/21/2016 1344   AST 25 04/08/2014 0947   ALT 12 12/21/2016 1344   ALT 33 04/08/2014 0947   ALKPHOS 97 12/21/2016 1344   ALKPHOS 88 04/08/2014 0947   BILITOT 0.4 12/21/2016 1344   BILITOT 0.78 04/08/2014 0947    GFRNONAA >60 08/25/2016 0950   GFRNONAA >89 04/23/2016 1608   GFRAA >60 08/25/2016 0950   GFRAA >89 04/23/2016 1608   TB Gold: negative (07/08/2015) Hepatitis panel: negative (06/14/2013) HIV: negative (07/08/2015)  Chest-xray:  11/10/2012 "IMPRESSION: No acute cardiopulmonary disease."  Contraception: Patient is on oral contraception. Strict contraception advised.    Alcohol:  Patient denies alcohol use.  She drinks approximately twice per year on special occasions.    Assessment/Plan:  Patient was counseled on the purpose, proper use, and adverse effects of methotrexate including nausea, infection, and signs and symptoms of pneumonitis.  Reviewed instructions with patient to take methotrexate weekly along with folic acid daily.  Discussed the importance of frequent monitoring of kidney and liver function and blood counts, and provided patient with standing lab instructions.  Counseled patient to avoid NSAIDs and alcohol while on methotrexate.  Provided patient with educational materials on methotrexate and answered all questions.  Advised patient to get annual influenza vaccine and to get a pneumococcal vaccine if patient has not already had one.  Patient confirms she has had both.  Patient voiced understanding.  Patient consented to methotrexate use.  Will upload into chart.  Patient was counseled to take methotrexate 4 tablets weekly for two weeks,  then get labs.  Will plan to increase to 6 tablets weekly for two weeks if labs are normal.  Advised labs again 2 weeks after increasing to 6 tablets.  Will plan to increase to 8 tablets weekly if labs are normal.  Advised labs again 2 weeks after increasing to 8 tablets.  Also discussed importance of taking folic acid 2 mg daily.  Patient voiced understanding.  Patient has follow up scheduled for 04/27/17.    Lilla Shook, Pharm.D., BCPS Clinical Pharmacist Pager: [phone] Phone: [phone] 02/02/2017 1:22 PM

## 2017-02-02 NOTE — Patient Instructions (Signed)
Standing Labs We placed an order today for your standing lab work.    Please come back and get your standing labs in 2 weeks times 3 then every 2 months.   We have open lab Monday through Friday from 8:30-11:30 AM and 1:30-4 PM at the office of Dr. Arbutus Ped, PA.   The office is located at [address], [place], [place] [address] No appointment is necessary.   Labs are drawn by [facility].  You may receive a bill from [facility] for your lab work.     Methotrexate tablets What is this medicine? METHOTREXATE (METH oh TREX ate) is a chemotherapy drug used to treat cancer including breast cancer, leukemia, and lymphoma. This medicine can also be used to treat psoriasis and certain kinds of arthritis. This medicine may be used for other purposes; ask your health care provider or pharmacist if you have questions. COMMON BRAND NAME(S): Rheumatrex, Trexall What should I tell my health care provider before I take this medicine? They need to know if you have any of these conditions: -fluid in the stomach area or lungs -if you often drink alcohol -infection or immune system problems -kidney disease or on hemodialysis -liver disease -low blood counts, like low white cell, platelet, or red cell counts -lung disease -radiation therapy -stomach ulcers -ulcerative colitis -an unusual or allergic reaction to methotrexate, other medicines, foods, dyes, or preservatives -pregnant or trying to get pregnant -breast-feeding How should I use this medicine? Take this medicine by mouth with a glass of water. Follow the directions on the prescription label. Take your medicine at regular intervals. Do not take it more often than directed. Do not stop taking except on your doctor's advice. Make sure you know why you are taking this medicine and how often you should take it. If this medicine is used for a condition that is not cancer, like arthritis or psoriasis, it should be taken  weekly, NOT daily. Taking this medicine more often than directed can cause serious side effects, even death. Talk to your healthcare provider about safe handling and disposal of this medicine. You may need to take special precautions. Talk to your pediatrician regarding the use of this medicine in children. While this drug may be prescribed for selected conditions, precautions do apply. Overdosage: If you think you have taken too much of this medicine contact a poison control center or emergency room at once. NOTE: This medicine is only for you. Do not share this medicine with others. What if I miss a dose? If you miss a dose, talk with your doctor or health care professional. Do not take double or extra doses. What may interact with this medicine? This medicine may interact with the following medication: -acitretin -aspirin and aspirin-like medicines including salicylates -azathioprine -certain antibiotics like penicillins, tetracycline, and chloramphenicol -cyclosporine -gold -hydroxychloroquine -live virus vaccines -NSAIDs, medicines for pain and inflammation, like ibuprofen or naproxen -other cytotoxic agents -penicillamine -phenylbutazone -phenytoin -probenecid -retinoids such as isotretinoin and tretinoin -steroid medicines like prednisone or cortisone -sulfonamides like sulfasalazine and trimethoprim/sulfamethoxazole -theophylline This list may not describe all possible interactions. Give your health care provider a list of all the medicines, herbs, non-prescription drugs, or dietary supplements you use. Also tell them if you smoke, drink alcohol, or use illegal drugs. Some items may interact with your medicine. What should I watch for while using this medicine? Avoid alcoholic drinks. This medicine can make you more sensitive to the sun. Keep out of the sun. If  you cannot avoid being in the sun, wear protective clothing and use sunscreen. Do not use sun lamps or tanning  beds/booths. You may need blood work done while you are taking this medicine. Call your doctor or health care professional for advice if you get a fever, chills or sore throat, or other symptoms of a cold or flu. Do not treat yourself. This drug decreases your body's ability to fight infections. Try to avoid being around people who are sick. This medicine may increase your risk to bruise or bleed. Call your doctor or health care professional if you notice any unusual bleeding. Check with your doctor or health care professional if you get an attack of severe diarrhea, nausea and vomiting, or if you sweat a lot. The loss of too much body fluid can make it dangerous for you to take this medicine. Talk to your doctor about your risk of cancer. You may be more at risk for certain types of cancers if you take this medicine. Both men and women must use effective birth control with this medicine. Do not become pregnant while taking this medicine or until at least 1 normal menstrual cycle has occurred after stopping it. Women should inform their doctor if they wish to become pregnant or think they might be pregnant. Men should not father a child while taking this medicine and for 3 months after stopping it. There is a potential for serious side effects to an unborn child. Talk to your health care professional or pharmacist for more information. Do not breast-feed an infant while taking this medicine. What side effects may I notice from receiving this medicine? Side effects that you should report to your doctor or health care professional as soon as possible: -allergic reactions like skin rash, itching or hives, swelling of the face, lips, or tongue -breathing problems or shortness of breath -diarrhea -dry, nonproductive cough -low blood counts - this medicine may decrease the number of white blood cells, red blood cells and platelets. You may be at increased risk for infections and bleeding. -mouth  sores -redness, blistering, peeling or loosening of the skin, including inside the mouth -signs of infection - fever or chills, cough, sore throat, pain or trouble passing urine -signs and symptoms of bleeding such as bloody or black, tarry stools; red or dark-brown urine; spitting up blood or brown material that looks like coffee grounds; red spots on the skin; unusual bruising or bleeding from the eye, gums, or nose -signs and symptoms of kidney injury like trouble passing urine or change in the amount of urine -signs and symptoms of liver injury like dark yellow or brown urine; general ill feeling or flu-like symptoms; light-colored stools; loss of appetite; nausea; right upper belly pain; unusually weak or tired; yellowing of the eyes or skin Side effects that usually do not require medical attention (report to your doctor or health care professional if they continue or are bothersome): -dizziness -hair loss -tiredness -upset stomach -vomiting This list may not describe all possible side effects. Call your doctor for medical advice about side effects. You may report side effects to FDA at 1-800-FDA-1088. Where should I keep my medicine? Keep out of the reach of children. Store at room temperature between 20 and 25 degrees C (68 and 77 degrees F). Protect from light. Throw away any unused medicine after the expiration date. NOTE: This sheet is a summary. It may not cover all possible information. If you have questions about this medicine, talk to your doctor,  pharmacist, or health care provider.  2018 Elsevier/Gold Standard (2015-06-15 05:39:22)

## 2017-02-08 ENCOUNTER — Ambulatory Visit: Payer: BLUE CROSS/BLUE SHIELD | Admitting: Rheumatology

## 2017-02-13 ENCOUNTER — Other Ambulatory Visit: Payer: Self-pay | Admitting: Physician Assistant

## 2017-02-14 ENCOUNTER — Other Ambulatory Visit: Payer: Self-pay | Admitting: Adult Health

## 2017-02-14 ENCOUNTER — Encounter: Payer: Self-pay | Admitting: Family Medicine

## 2017-02-14 NOTE — Telephone Encounter (Signed)
May fill 

## 2017-02-14 NOTE — Telephone Encounter (Signed)
Dr. Durene Cal - Please advise on refill request for Clonazepam. Thanks!

## 2017-02-15 ENCOUNTER — Other Ambulatory Visit: Payer: Self-pay

## 2017-02-15 MED ORDER — HYDROCODONE-ACETAMINOPHEN 5-325 MG PO TABS: 1.0000 | ORAL_TABLET | Freq: Four times a day (QID) | ORAL | 0 refills | Status: DC | PRN

## 2017-02-15 MED ORDER — CLONAZEPAM 0.5 MG PO TABS: 0.2500 mg | ORAL_TABLET | Freq: Every evening | ORAL | 1 refills | Status: DC | PRN

## 2017-02-15 NOTE — Telephone Encounter (Signed)
Chart reviewed, patient with history of severe migraine headaches, does use Narco 5/325 mg 1 tablet as needed, last refill was on January 10 2017, she got10 tablets of Narco.  Will refill, limit 10 tabs each month

## 2017-02-27 ENCOUNTER — Ambulatory Visit (INDEPENDENT_AMBULATORY_CARE_PROVIDER_SITE_OTHER): Payer: BLUE CROSS/BLUE SHIELD | Admitting: Family Medicine

## 2017-02-27 ENCOUNTER — Encounter: Payer: Self-pay | Admitting: Family Medicine

## 2017-02-27 VITALS — BP 132/82 | HR 85 | Temp 98.2°F | Ht 62.5 in | Wt 218.0 lb

## 2017-02-27 DIAGNOSIS — E119 Type 2 diabetes mellitus without complications: Secondary | ICD-10-CM | POA: Diagnosis not present

## 2017-02-27 NOTE — Progress Notes (Signed)
Subjective:  [name] is a 41 y.o. year old very pleasant female patient who presents for/with See problem oriented charting ROS- total hair loss. No chest pain or shortness of breath. Headaches seem somewhat better. Joint pain somewhat better.    Past Medical History-  Patient Active Problem List   Diagnosis Date Noted  . Leucocytosis 12/09/2015    Priority: High  . Inflammatory arthritis 12/09/2015    Priority: High  . Diabetes mellitus type II, controlled (HCC) 03/25/2012    Priority: High  . Hyperprolactinemia (HCC) 03/25/2012    Priority: High  . 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 Migraine headache 03/25/2012    Priority: High  . Hyperlipidemia 08/29/2016    Priority: Medium  . Elevated uric acid in blood 05/10/2015    Priority: Medium  . Anemia 01/01/2014    Priority: Medium  . Insomnia 01/04/2013    Priority: Medium  . Polycystic ovaries 03/25/2012    Priority: Medium  . Adult body mass index 37.0-37.9 03/25/2012    Priority: Low  . Cushing's syndrome-iatrogenic 03/25/2012    Priority: Low  . Allergic rhinitis due to pollen 03/25/2012    Priority: Low  . GERD (gastroesophageal reflux disease) 03/25/2012    Priority: Low  . Primary osteoarthritis of both hands 01/18/2017  . Primary osteoarthritis of both knees 01/18/2017  . Primary osteoarthritis of both feet 01/18/2017  . Plantar fasciitis 01/18/2017  . High risk medication use 01/18/2017    Medications- reviewed and updated Current Outpatient Prescriptions  Medication Sig Dispense Refill  . aspirin-acetaminophen-caffeine (EXCEDRIN MIGRAINE) 250-250-65 MG tablet Take 2 tablets by mouth every 6 (six) hours as needed for headache.    . Bepotastine Besilate (BEPREVE) 1.5 % SOLN Place 1 drop into both eyes as needed (for allergies).     . cetirizine (ZYRTEC) 10 MG tablet Take 10 mg by mouth at bedtime as needed for allergies.     . clonazePAM (KLONOPIN) 0.5 MG tablet Take  0.5 tablets (0.25 mg total) by mouth at bedtime as needed for anxiety. 60 tablet 1  . cyclobenzaprine (FLEXERIL) 10 MG tablet Take 1 tablet (10 mg total) by mouth at bedtime. 30 tablet 5  . Diclofenac Potassium 50 MG PACK Take 50 mg by mouth as needed (for migraines).    . dihydroergotamine (MIGRANAL) 4 MG/ML nasal spray Place 1 spray into the nose as needed for migraine. Use in one nostril as directed.  No more than 4 sprays in one hour    . eletriptan (RELPAX) 40 MG tablet Take 1 tablet (40 mg total) by mouth as needed for migraine or headache. May repeat in 2 hours if headache persists or recurs. 10 tablet 11  . Fe Fum-Vit C-Vit B12-FA (TRIGELS-F) 460-60-0.01-1 MG CAPS capsule Take 1 capsule by mouth every other day.    . folic acid (FOLVITE) 1 MG tablet Take 2 tablets (2 mg total) by mouth daily. 180 tablet 3  . HYDROcodone-acetaminophen (NORCO/VICODIN) 5-325 MG tablet Take 1 tablet by mouth every 6 (six) hours as needed for moderate pain. 10 tablet 0  . methotrexate (RHEUMATREX) 2.5 MG tablet Take 4 tab once weekly for two wks  6 tablets PO weekly for two weeks, then  8 tablets PO weekly.  Caution:Chemotherapy. Protect from light. 84 tablet 0  . Multiple Vitamin (MULITIVITAMIN WITH MINERALS) TABS Take 1 tablet by mouth daily.    . Norethindrone-Ethinyl Estradiol-Fe Biphas (LO LOESTRIN FE) 1 MG-10 MCG / 10 MCG tablet  Take 1 tablet by mouth daily.    . nortriptyline (PAMELOR) 10 MG capsule Take 3 capsules (30 mg total) by mouth at bedtime. 90 capsule 5  . omeprazole (PRILOSEC) 20 MG capsule Take 20 mg by mouth at bedtime.     . ondansetron (ZOFRAN) 4 MG tablet Take 1 tablet (4 mg total) by mouth every 8 (eight) hours as needed for nausea or vomiting. 20 tablet 5  . propranolol ER (INDERAL LA) 80 MG 24 hr capsule Take 1 capsule (80 mg total) by mouth daily. (Patient taking differently: Take 80 mg by mouth at bedtime. ) 30 capsule 11  . sulfaSALAzine (AZULFIDINE) 500 MG tablet Take 2 tablets (1,000  mg total) by mouth 2 (two) times daily. Takes at 12 noon and midnight 120 tablet 0   No current facility-administered medications for this visit.     Objective: BP 132/82 (BP Location: Left Arm, Patient Position: Sitting, Cuff Size: Large)   Pulse 85   Temp 98.2 F (36.8 C) (Oral)   Ht 5' 2.5" (1.588 m)   Wt 218 lb (98.9 kg)   LMP 02/20/2017   SpO2 97%   BMI 39.24 kg/m  Gen: NAD, resting comfortably CV: RRR no murmurs rubs or gallops Lungs: CTAB no crackles, wheeze, rhonchi Abdomen: soft/nontender/nondistended/normal bowel sounds. No rebound or guarding.  Ext: no edema Skin: warm, dry, total hair loss noted  Assessment/Plan:  Diabetes mellitus type II, controlled (HCC) S: hopefully controlled. On no medicine. Previously thought to be steroid related but now off steroids and persisted CBGs- does not check frequently. 120s fasting.  Exercise and diet- down 8 lbs- very pleased with these results.feels like birth control pills holding her back slightly. Exercise limited by  Lab Results  Component Value Date   HGBA1C 7.1 (H) 12/21/2016   HGBA1C 6.7 (H) 09/12/2016   HGBA1C 6.7 07/08/2015   A/P: thrilled with weight loss. Encouraged her to continue efforts. Update a1c June 1st. Add cbc, cmp as also needed per rheumatology. Counseling provided on weight loss- she declines diabetes education for now. Agreed to 8 as upper limit for a1c- for trial lifestyle changes  Return in about 6 months (around 08/30/2017) for physical.  Orders Placed This Encounter  Procedures  . Comprehensive metabolic panel    [place]    Standing Status:   Future    Standing Expiration Date:   02/27/2018  . CBC with Differential/Platelet    Standing Status:   Future    Standing Expiration Date:   02/27/2018  . Hemoglobin A1c    [place]    Standing Status:   Future    Standing Expiration Date:   02/27/2018    Meds ordered this encounter  Medications  . dihydroergotamine (MIGRANAL) 4 MG/ML nasal spray     Sig: Place 1 spray into the nose as needed for migraine. Use in one nostril as directed.  No more than 4 sprays in one hour   The duration of face-to-face time during this visit was greater than 15 minutes. Greater than 50% of this time was spent in counseling, explanation of diagnosis, planning of further management, and/or coordination of care including diet/exercise/a1c goals.   Return precautions advised.  Tana Conch, MD

## 2017-02-27 NOTE — Patient Instructions (Addendum)
Schedule a lab visit at the check out desk for June 1st or later. Return for future fasting labs meaning nothing but water after midnight please. Ok to take your medications with water.   If there are additional labs Dr. Corliss Skains wants- happy to add those under code she would like- just let us know  [place] job losing 8 lbs! Keep up the good work. As long as a1c is under 8 we can keep trying without meds but hopeful going in right direction in June

## 2017-02-27 NOTE — Assessment & Plan Note (Signed)
S: hopefully controlled. On no medicine. Previously thought to be steroid related but now off steroids and persisted CBGs- does not check frequently. 120s fasting.  Exercise and diet- down 8 lbs- very pleased with these results.feels like birth control pills holding her back slightly. Exercise limited by  Lab Results  Component Value Date   HGBA1C 7.1 (H) 12/21/2016   HGBA1C 6.7 (H) 09/12/2016   HGBA1C 6.7 07/08/2015   A/P: thrilled with weight loss. Encouraged her to continue efforts. Update a1c June 1st. Add cbc, cmp as also needed per rheumatology. Counseling provided on weight loss- she declines diabetes education for now. Agreed to 8 as upper limit for a1c- for trial lifestyle changes

## 2017-02-27 NOTE — Progress Notes (Signed)
Pre visit review using our clinic review tool, if applicable. No additional management support is needed unless otherwise documented below in the visit note. 

## 2017-03-22 IMAGING — DX DG ELBOW COMPLETE 3+V*L*
4 series · 4 of 4 positions shown · non-contrast
Comparison: None.

CLINICAL DATA: Pain, no known injury, question lateral
epicondylitis

EXAM:
LEFT ELBOW - COMPLETE 3+ VIEW

[elbow ap]
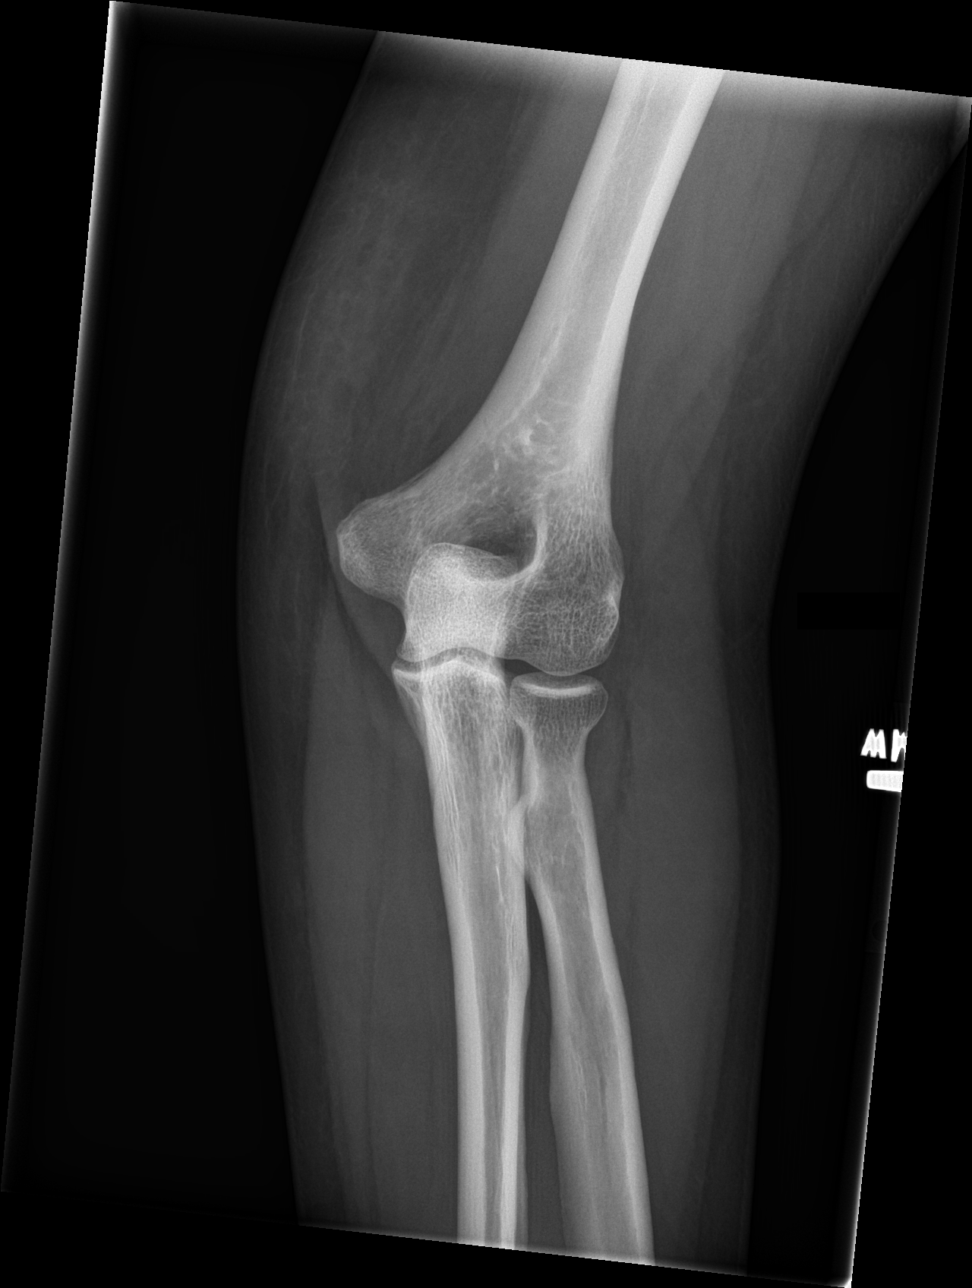

[elbow obl (1 of 2)]
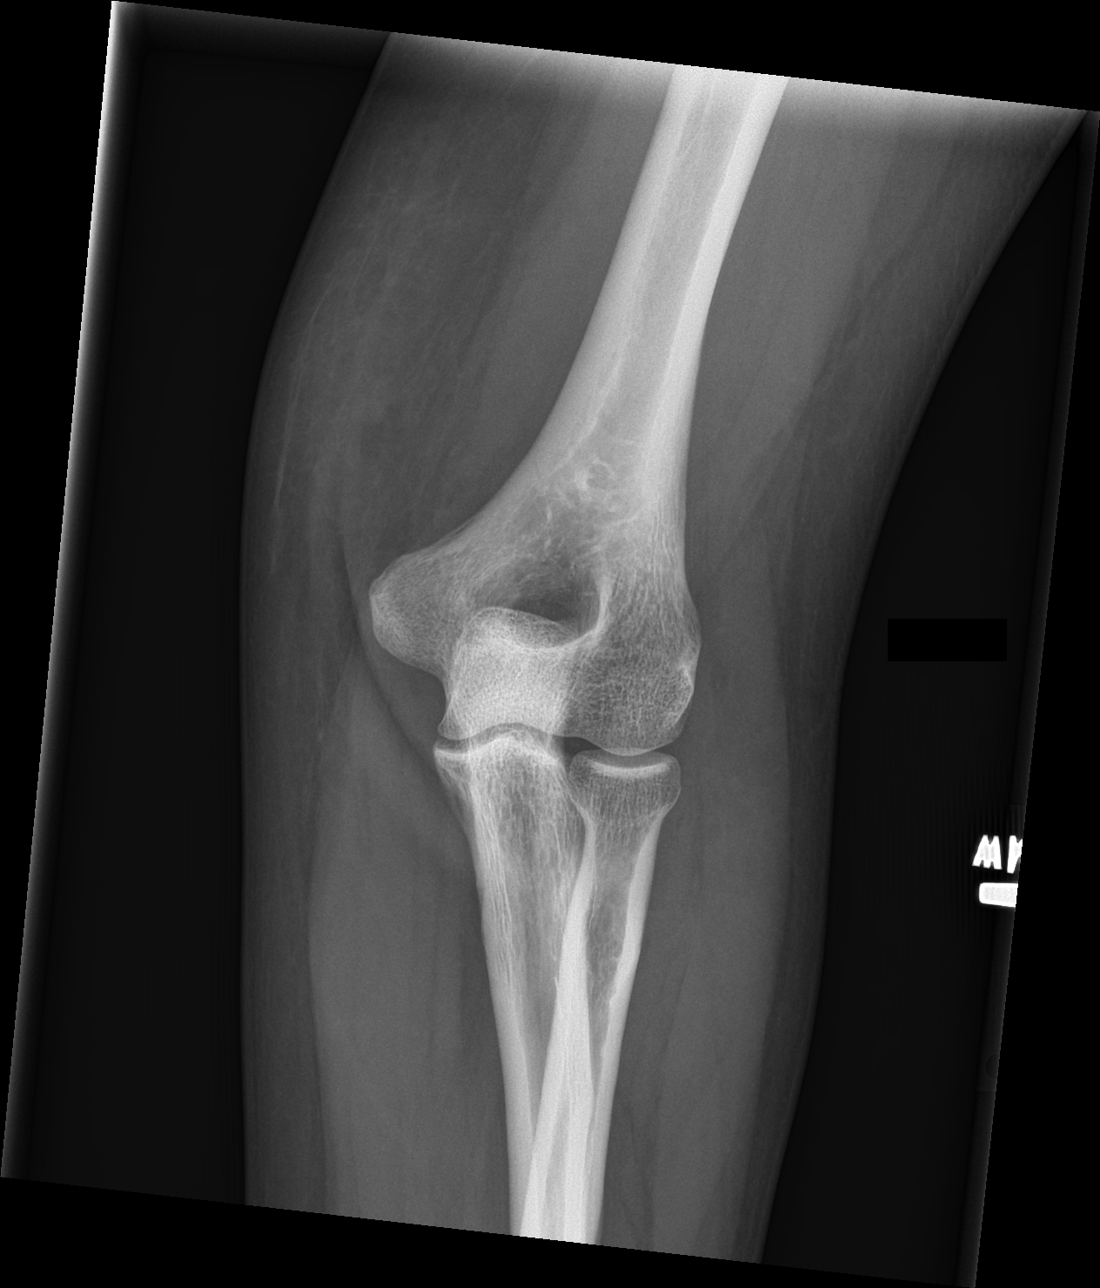

[elbow obl (2 of 2)]
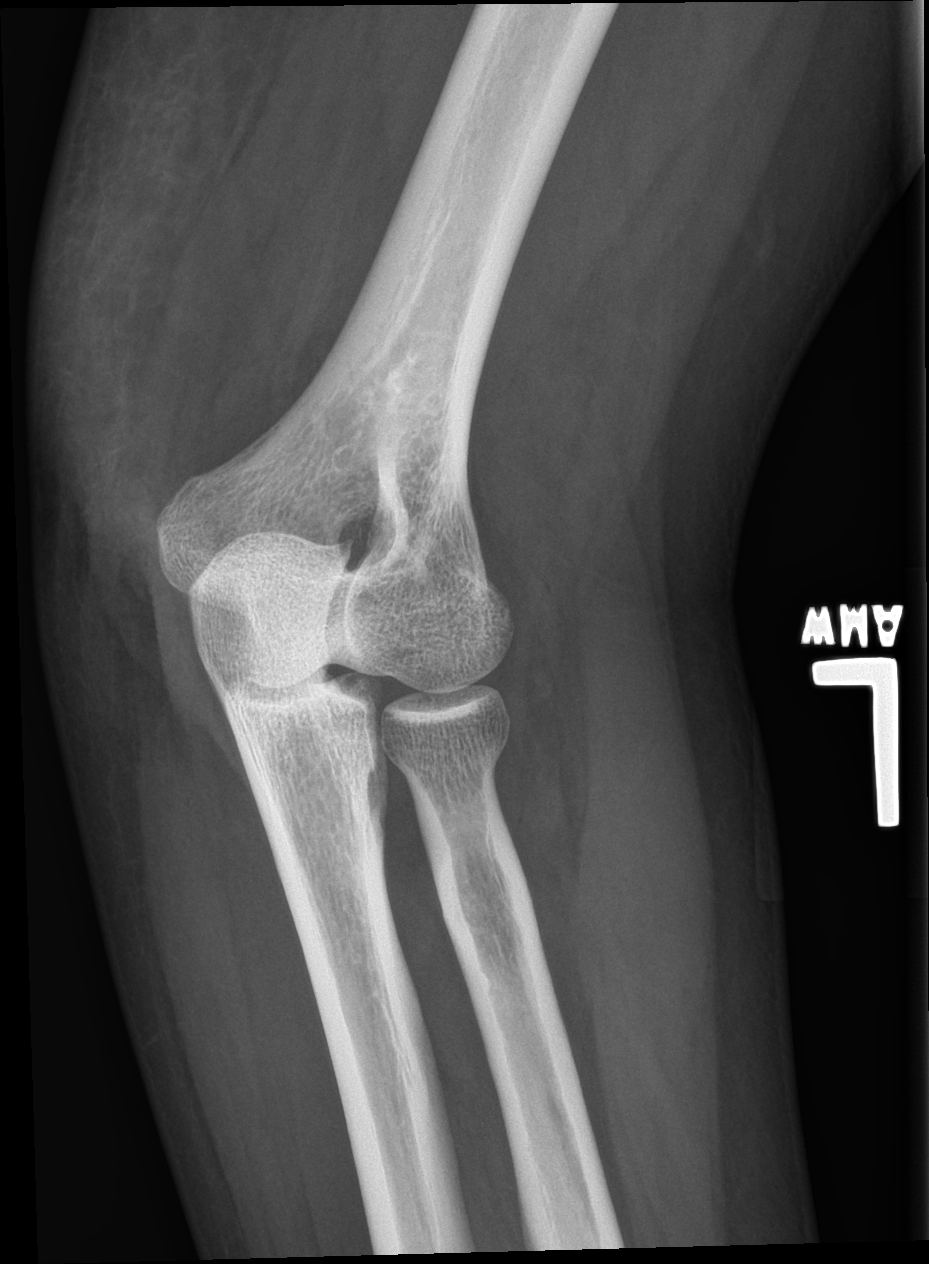

[elbow lat]
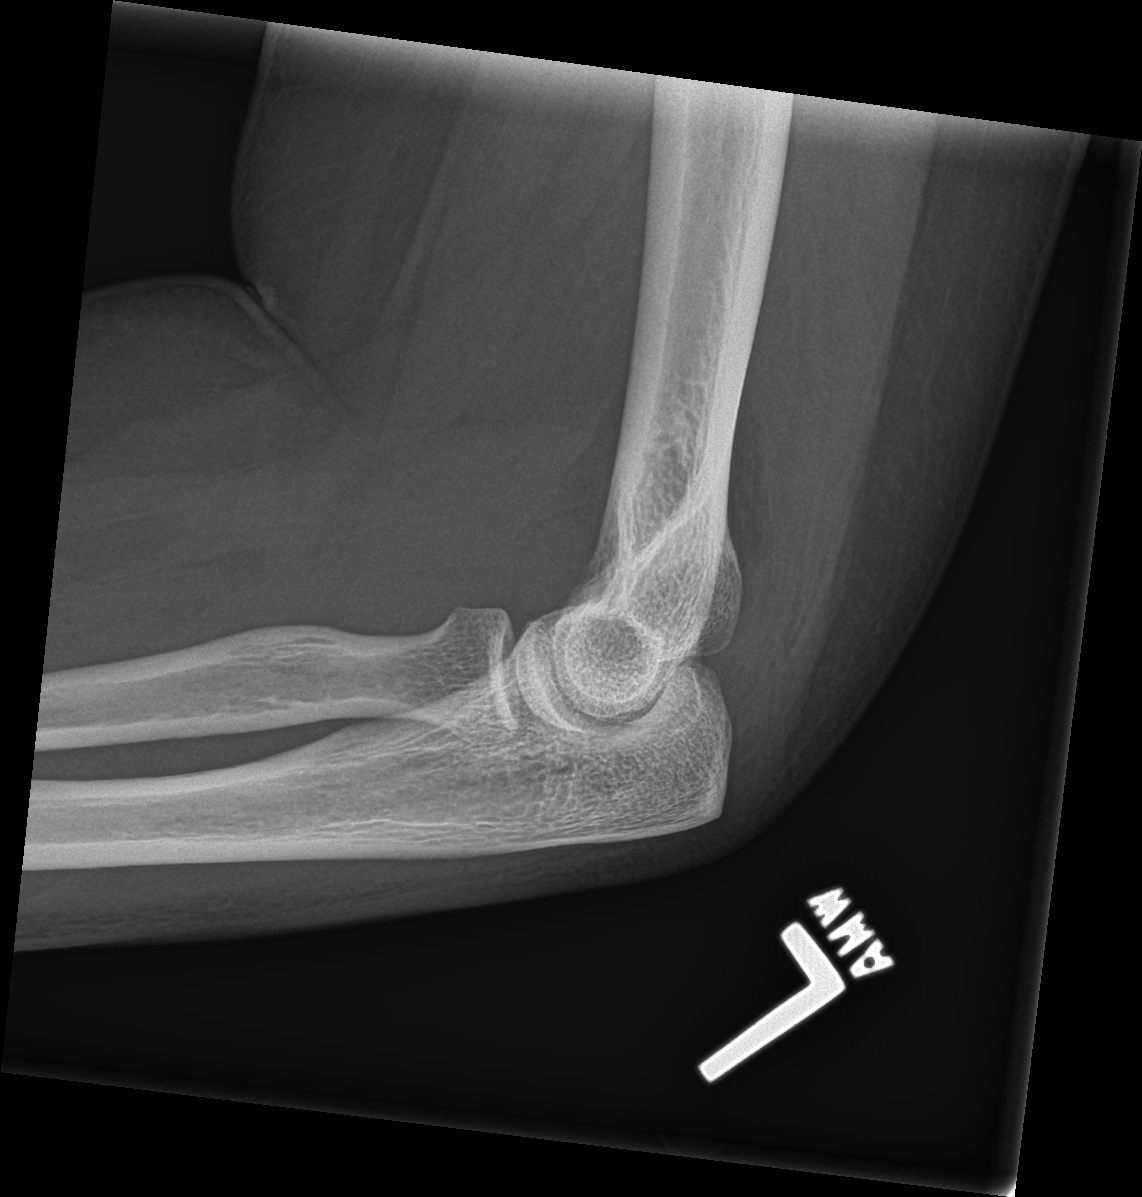

[4 of 4 positions shown; findings below may reference images not displayed]

FINDINGS: Four views of the left elbow submitted. No acute fracture or
subluxation. There is small joint effusion.
IMPRESSION: No acute fracture or subluxation.  Small joint effusion.

## 2017-03-24 ENCOUNTER — Other Ambulatory Visit: Payer: Self-pay | Admitting: Neurology

## 2017-03-24 MED ORDER — HYDROCODONE-ACETAMINOPHEN 5-325 MG PO TABS: 1.0000 | ORAL_TABLET | Freq: Four times a day (QID) | ORAL | 0 refills | Status: DC | PRN

## 2017-03-24 NOTE — Telephone Encounter (Signed)
Has appt with MM/NP 05/15/17. Last filled 02-15-17.

## 2017-03-27 ENCOUNTER — Other Ambulatory Visit (INDEPENDENT_AMBULATORY_CARE_PROVIDER_SITE_OTHER): Payer: BLUE CROSS/BLUE SHIELD

## 2017-03-27 DIAGNOSIS — E119 Type 2 diabetes mellitus without complications: Secondary | ICD-10-CM | POA: Diagnosis not present

## 2017-03-27 LAB — CBC WITH DIFFERENTIAL/PLATELET
BASOS PCT: 0.5 % (ref 0.0–3.0)
Basophils Absolute: 0.1 10*3/uL (ref 0.0–0.1)
EOS ABS: 0.3 10*3/uL (ref 0.0–0.7)
Eosinophils Relative: 2.3 % (ref 0.0–5.0)
HEMATOCRIT: 34.1 % — AB (ref 36.0–46.0)
Hemoglobin: 10.9 g/dL — ABNORMAL LOW (ref 12.0–15.0)
LYMPHS ABS: 3.6 10*3/uL (ref 0.7–4.0)
Lymphocytes Relative: 26.2 % (ref 12.0–46.0)
MCHC: 32 g/dL (ref 30.0–36.0)
MCV: 72 fl — ABNORMAL LOW (ref 78.0–100.0)
Monocytes Absolute: 0.4 10*3/uL (ref 0.1–1.0)
Monocytes Relative: 2.9 % — ABNORMAL LOW (ref 3.0–12.0)
NEUTROS ABS: 9.3 10*3/uL — AB (ref 1.4–7.7)
Neutrophils Relative %: 68.1 % (ref 43.0–77.0)
PLATELETS: 370 10*3/uL (ref 150.0–400.0)
RBC: 4.74 Mil/uL (ref 3.87–5.11)
RDW: 18.7 % — AB (ref 11.5–15.5)
WBC: 13.6 10*3/uL — ABNORMAL HIGH (ref 4.0–10.5)

## 2017-03-27 LAB — COMPREHENSIVE METABOLIC PANEL
ALBUMIN: 4.2 g/dL (ref 3.5–5.2)
ALT: 11 U/L (ref 0–35)
AST: 11 U/L (ref 0–37)
Alkaline Phosphatase: 89 U/L (ref 39–117)
BUN: 16 mg/dL (ref 6–23)
CHLORIDE: 103 meq/L (ref 96–112)
CO2: 26 mEq/L (ref 19–32)
CREATININE: 0.75 mg/dL (ref 0.40–1.20)
Calcium: 9.6 mg/dL (ref 8.4–10.5)
GFR: 90.1 mL/min (ref >60.00)
Glucose, Bld: 142 mg/dL — ABNORMAL HIGH (ref 70–99)
POTASSIUM: 4.8 meq/L (ref 3.5–5.1)
SODIUM: 138 meq/L (ref 135–145)
TOTAL PROTEIN: 6.7 g/dL (ref 6.0–8.3)
Total Bilirubin: 0.4 mg/dL (ref 0.2–1.2)

## 2017-03-27 LAB — HEMOGLOBIN A1C: HEMOGLOBIN A1C: 7 % — AB (ref 4.6–6.5)

## 2017-03-30 ENCOUNTER — Encounter: Payer: Self-pay | Admitting: Rheumatology

## 2017-04-08 ENCOUNTER — Other Ambulatory Visit: Payer: Self-pay | Admitting: Rheumatology

## 2017-04-09 ENCOUNTER — Other Ambulatory Visit: Payer: Self-pay | Admitting: Neurology

## 2017-04-10 NOTE — Telephone Encounter (Signed)
ok 

## 2017-04-10 NOTE — Telephone Encounter (Signed)
Last Visit: 01/25/17 Next Visit: 04/27/17 Labs: 03/27/17 stable   Okay to refill MTX?

## 2017-04-18 ENCOUNTER — Encounter: Payer: Self-pay | Admitting: Adult Health

## 2017-04-24 ENCOUNTER — Other Ambulatory Visit: Payer: Self-pay | Admitting: Neurology

## 2017-04-24 NOTE — Progress Notes (Signed)
Office Visit Note  Patient: [name]             Date of Birth: 05/23/1976           MRN: [id number]             PCP: Shelva Majestic, MD Referring: Shelva Majestic, MD Visit Date: 04/27/2017 Occupation: @GUAROCC @    Subjective:  Pain in multiple joints.   History of Present Illness: [name] is a 41 y.o. female with history of seronegative inflammatory arthritis. She states she's not having a good response to methotrexate. She tried sulfasalazine but stopped it as she felt it was raising her hemoglobin A1c. She's been on methotrexate for 2 months now. She continues to have pain and discomfort in her bilateral shoulders her left elbow bilateral hands hips and knee joints. She has noticed some swelling in her bilateral hands and bilateral feet.   Activities of Daily Living:  Patient reports morning stiffness for 1 hour.   Patient Denies nocturnal pain.  Difficulty dressing/grooming: Reports Difficulty climbing stairs: Reports Difficulty getting out of chair: Reports Difficulty using hands for taps, buttons, cutlery, and/or writing: Denies   Review of Systems  Constitutional: Positive for fatigue. Negative for night sweats, weight gain, weight loss and weakness.  HENT: Positive for mouth sores and mouth dryness. Negative for trouble swallowing, trouble swallowing and nose dryness.   Eyes: Positive for dryness. Negative for pain, redness and visual disturbance.  Respiratory: Negative.  Negative for cough, shortness of breath and difficulty breathing.   Cardiovascular: Negative.  Negative for chest pain, palpitations, hypertension, irregular heartbeat and swelling in legs/feet.  Gastrointestinal: Negative.  Negative for blood in stool, constipation and diarrhea.  Endocrine: Negative.  Negative for increased urination.  Genitourinary: Negative.  Negative for nocturia and vaginal dryness.  Musculoskeletal: Positive for arthralgias, joint pain,  joint swelling, myalgias, muscle weakness and myalgias. Negative for morning stiffness and muscle tenderness.  Skin: Positive for hair loss. Negative for color change, rash, skin tightness, ulcers and sensitivity to sunlight.  Allergic/Immunologic: Negative for susceptible to infections.  Neurological: Negative for dizziness, memory loss and night sweats.  Hematological: Negative.  Negative for swollen glands.  Psychiatric/Behavioral: Negative.  Negative for depressed mood and sleep disturbance. The patient is not nervous/anxious.     PMFS History:  Patient Active Problem List   Diagnosis Date Noted  . Primary osteoarthritis of both hands 01/18/2017  . Primary osteoarthritis of both knees 01/18/2017  . Primary osteoarthritis of both feet 01/18/2017  . Plantar fasciitis 01/18/2017  . High risk medication use 01/18/2017  . Hyperlipidemia 08/29/2016  . Leucocytosis 12/09/2015  . Inflammatory arthritis 12/09/2015  . Elevated uric acid in blood 05/10/2015  . Anemia 01/01/2014  . Insomnia 01/04/2013  . Polycystic ovaries 03/25/2012  . Diabetes mellitus type II, controlled (HCC) 03/25/2012  . Adult body mass index 37.0-37.9 03/25/2012  . Cushing's syndrome-iatrogenic 03/25/2012  . Hyperprolactinemia (HCC) 03/25/2012  . 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 Allergic rhinitis due to pollen 03/25/2012  . GERD (gastroesophageal reflux disease) 03/25/2012  . Migraine headache 03/25/2012    Past Medical History:  Diagnosis Date  . Allergy   . Alopecia areata totalis   . Anemia   . Anxiety   . Arthritis   . Depression    meds for 1 year around loss of father  . GERD (gastroesophageal reflux disease)   . Hypertension   . Migraine   . Polycystic ovary disease  Family History  Problem Relation Age of Onset  . Hypertension Mother   . Hyperlipidemia Mother   . Breast cancer Mother        83  . Hyperlipidemia Maternal Grandmother   . Hypertension Maternal Grandmother   . Heart  failure Maternal Grandmother   . Brain cancer Father        glioblastoma  . Hyperlipidemia Father   . Hypertension Sister    Past Surgical History:  Procedure Laterality Date  . CHOLECYSTECTOMY    . DILATATION & CURETTAGE/HYSTEROSCOPY WITH MYOSURE N/A 08/26/2016   #2  Procedure: DILATATION & CURETTAGE/HYSTEROSCOPY WITH MYOSURE;  Surgeon: Jaymes Graff, MD;  Location: [facility];  Service: Gynecology;  Laterality: N/A;  . DILATION AND CURETTAGE OF UTERUS     #1  . NASAL SEPTUM SURGERY    . WISDOM TOOTH EXTRACTION     Social History   Social History Narrative    Married. No kids. 2 dogs, 3 cats. Lives at home with her husband.      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      Objective: Vital Signs: BP (!) 143/95   Pulse 90   Resp 12   Ht 5\' 2"  (1.575 m)   Wt 217 lb (98.4 kg)   BMI 39.69 kg/m    Physical Exam  Constitutional: She is oriented to person, place, and time. She appears well-developed and well-nourished.  HENT:  Head: Normocephalic and atraumatic.  Eyes: Conjunctivae and EOM are normal.  Neck: Normal range of motion.  Cardiovascular: Normal rate, regular rhythm, normal heart sounds and intact distal pulses.   Pulmonary/Chest: Effort normal and breath sounds normal.  Abdominal: Soft. Bowel sounds are normal.  Lymphadenopathy:    She has no cervical adenopathy.  Neurological: She is alert and oriented to person, place, and time.  Skin: Skin is warm and dry. Capillary refill takes less than 2 seconds.  Alopecia totalis  Psychiatric: She has a normal mood and affect. Her behavior is normal.  Nursing note and vitals reviewed.    Musculoskeletal Exam: C-spine and thoracic lumbar spine good range of motion. Shoulder joints elbow joints wrist joint MCPs PIPs DIPs with good range of motion with no synovitis. Hip joints knee joints ankles MTPs PIPs with good range of motion with no synovitis. She tenderness on palpation over her left third MCP  joint and left ankle joint.  CDAI Exam: CDAI Homunculus Exam:   Tenderness:  Left hand: 3rd MCP LLE: tibiotalar  Joint Counts:  CDAI Tender Joint count: 1 CDAI Swollen Joint count: 0  Global Assessments:  Patient Global Assessment: 6   CDAI Calculated Score: 7    Investigation: No additional findings. CBC Latest Ref Rng & Units 03/27/2017 12/21/2016 09/12/2016  WBC 4.0 - 10.5 K/uL 13.6(H) 13.6(H) 13.0(H)  Hemoglobin 12.0 - 15.0 g/dL 10.9(L) 10.6(L) 10.2(L)  Hematocrit 36.0 - 46.0 % 34.1(L) 34.2(L) 31.9(L)  Platelets 150.0 - 400.0 K/uL 370.0 438.0(H) 392.0    CMP Latest Ref Rng & Units 03/27/2017 12/21/2016 09/12/2016  Glucose 70 - 99 mg/dL 09/14/2016) 355(D) 322(G)  BUN 6 - 23 mg/dL 16 8 10   Creatinine 0.40 - 1.20 mg/dL 254(Y 7.06  Sodium 135 - 145 mEq/L 138 137 141  Potassium 3.5 - 5.1 mEq/L 4.8 4.6 4.5  Chloride 96 - 112 mEq/L 103 103 105  CO2 19 - 32 mEq/L 26 24 24   Calcium 8.4 - 10.5 mg/dL 9.6 9.3 9.1  Total Protein 6.0 - 8.3  g/dL 6.7 7.0 6.5  Total Bilirubin 0.2 - 1.2 mg/dL 0.4 0.4 0.2  Alkaline Phos 39 - 117 U/L 89 97 74  AST 0 - 37 U/L 11 14 11   ALT 0 - 35 U/L 11 12 8      Imaging: No results found.  Speciality Comments: No specialty comments available.    Procedures:  No procedures performed Allergies: Darvocet [propoxyphene n-acetaminophen]; Felodipine er; Prednisone; Imitrex [sumatriptan succinate]; and Latex   Assessment / Plan:     Visit Diagnoses: Inflammatory arthritis: Patient had synovitis in the past. She continues to have arthralgias. I do not see any synovitis on examination today. She believes that methotrexate is not effective she's been taking it for 2 months now. She also discontinued sulfasalazine as it caused elevation of her hemoglobin A1c. She's interested in starting on . As she believes that it may help her with her arthritis and also alopecia totalis. We decided to get a sedimentation rate today and also a total body bone scan if  she has active inflammation I would be more inclined to switch her to . We also discussed a referral to [facility] rheumatology for their opinion. We will proceed with a referral.. Her labs have been stable and we will monitor labs every 3 months.  High risk medication use - Methotrexate , folic acid 2 mg by mouth daily  Alopecia (capitis) totalis  Primary osteoarthritis of both hands: Mild  Primary osteoarthritis of both knees: Chronic pain: Chronic pain  Primary osteoarthritis of both feet  Plantar fasciitis: The salt  Elevated uric acid in blood  Primary insomnia  Other elevated white blood cell (WBC) count  Adult body mass index 37.0-37.9  History of Cushing's syndrome    Orders: Orders Placed This Encounter  Procedures  . NM Bone Scan Whole Body  . Sedimentation rate  . Ambulatory referral to Rheumatology   No orders of the defined types were placed in this encounter.   Face-to-face time spent with patient was 30 minutes. 50% of time was spent in counseling and coordination of care.  Follow-Up Instructions: Return for Rheumatoid arthritis.   [facility], MD  Note - This record has been created using 07-17-1971.  Chart creation errors have been sought, but may not always  have been located. Such creation errors do not reflect on  the standard of medical care.

## 2017-04-25 ENCOUNTER — Other Ambulatory Visit: Payer: Self-pay | Admitting: *Deleted

## 2017-04-25 MED ORDER — HYDROCODONE-ACETAMINOPHEN 5-325 MG PO TABS: 1.0000 | ORAL_TABLET | Freq: Four times a day (QID) | ORAL | 0 refills | Status: DC | PRN

## 2017-04-27 ENCOUNTER — Encounter: Payer: Self-pay | Admitting: Rheumatology

## 2017-04-27 ENCOUNTER — Ambulatory Visit (INDEPENDENT_AMBULATORY_CARE_PROVIDER_SITE_OTHER): Payer: BLUE CROSS/BLUE SHIELD | Admitting: Rheumatology

## 2017-04-27 VITALS — BP 143/95 | HR 90 | Resp 12 | Ht 62.0 in | Wt 217.0 lb

## 2017-04-27 DIAGNOSIS — M722 Plantar fascial fibromatosis: Secondary | ICD-10-CM | POA: Diagnosis not present

## 2017-04-27 DIAGNOSIS — L630 Alopecia (capitis) totalis: Secondary | ICD-10-CM | POA: Diagnosis not present

## 2017-04-27 DIAGNOSIS — Z6837 Body mass index (BMI) 37.0-37.9, adult: Secondary | ICD-10-CM

## 2017-04-27 DIAGNOSIS — M19071 Primary osteoarthritis, right ankle and foot: Secondary | ICD-10-CM | POA: Diagnosis not present

## 2017-04-27 DIAGNOSIS — E790 Hyperuricemia without signs of inflammatory arthritis and tophaceous disease: Secondary | ICD-10-CM

## 2017-04-27 DIAGNOSIS — Z79899 Other long term (current) drug therapy: Secondary | ICD-10-CM

## 2017-04-27 DIAGNOSIS — F5101 Primary insomnia: Secondary | ICD-10-CM

## 2017-04-27 DIAGNOSIS — Z8639 Personal history of other endocrine, nutritional and metabolic disease: Secondary | ICD-10-CM

## 2017-04-27 DIAGNOSIS — D72828 Other elevated white blood cell count: Secondary | ICD-10-CM

## 2017-04-27 DIAGNOSIS — M19072 Primary osteoarthritis, left ankle and foot: Secondary | ICD-10-CM

## 2017-04-27 DIAGNOSIS — M1990 Unspecified osteoarthritis, unspecified site: Secondary | ICD-10-CM | POA: Diagnosis not present

## 2017-04-27 DIAGNOSIS — M19041 Primary osteoarthritis, right hand: Secondary | ICD-10-CM | POA: Diagnosis not present

## 2017-04-27 DIAGNOSIS — M170 Bilateral primary osteoarthritis of knee: Secondary | ICD-10-CM

## 2017-04-27 DIAGNOSIS — M19042 Primary osteoarthritis, left hand: Secondary | ICD-10-CM

## 2017-04-28 LAB — SEDIMENTATION RATE: SED RATE: 14 mm/h (ref 0–20)

## 2017-04-28 NOTE — Progress Notes (Signed)
ESR normal

## 2017-05-01 ENCOUNTER — Other Ambulatory Visit: Payer: Self-pay | Admitting: Family Medicine

## 2017-05-11 LAB — HM PAP SMEAR: HM PAP: NORMAL

## 2017-05-15 ENCOUNTER — Ambulatory Visit (INDEPENDENT_AMBULATORY_CARE_PROVIDER_SITE_OTHER): Payer: BLUE CROSS/BLUE SHIELD | Admitting: Adult Health

## 2017-05-15 ENCOUNTER — Encounter: Payer: Self-pay | Admitting: Adult Health

## 2017-05-15 VITALS — BP 131/87 | HR 82 | Wt 216.4 lb

## 2017-05-15 DIAGNOSIS — G43009 Migraine without aura, not intractable, without status migrainosus: Secondary | ICD-10-CM | POA: Diagnosis not present

## 2017-05-15 MED ORDER — NORTRIPTYLINE HCL 10 MG PO CAPS: 40.0000 mg | ORAL_CAPSULE | Freq: Every day | ORAL | 5 refills | Status: DC

## 2017-05-15 NOTE — Progress Notes (Signed)
I have reviewed and agreed above plan. 
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
## 2017-05-15 NOTE — Patient Instructions (Signed)
  Your Plan:  Increase Nortriptyline 40 mg at bedtime Continue Relpax and Vicodin for severe headaches If your symptoms worsen or you develop new symptoms please let us know.     Thank you for coming to see [place] at [facility]. I hope we have been able to provide you high quality care today.  You may receive a patient satisfaction survey over the next few weeks. We would appreciate your feedback and comments so that we may continue to improve ourselves and the health of our patients.

## 2017-05-17 ENCOUNTER — Telehealth: Payer: Self-pay | Admitting: *Deleted

## 2017-05-17 NOTE — Telephone Encounter (Signed)
[employer] on [employer], NP's desk for review, signature.

## 2017-05-18 ENCOUNTER — Encounter: Payer: Self-pay | Admitting: Family Medicine

## 2017-05-19 ENCOUNTER — Encounter: Payer: Self-pay | Admitting: Adult Health

## 2017-05-19 NOTE — Telephone Encounter (Signed)
[employer] completed, signed, sent to MR for processing. This RN replied to my chart message and advised patient of above.

## 2017-05-20 ENCOUNTER — Other Ambulatory Visit: Payer: Self-pay | Admitting: Neurology

## 2017-05-23 ENCOUNTER — Telehealth: Payer: Self-pay | Admitting: *Deleted

## 2017-05-23 NOTE — Telephone Encounter (Signed)
Pt FMLA form faxed on 05/23/17

## 2017-05-24 DIAGNOSIS — Z0289 Encounter for other administrative examinations: Secondary | ICD-10-CM

## 2017-05-25 ENCOUNTER — Other Ambulatory Visit: Payer: Self-pay | Admitting: Neurology

## 2017-05-29 ENCOUNTER — Encounter: Payer: Self-pay | Admitting: Family Medicine

## 2017-05-30 ENCOUNTER — Encounter: Payer: Self-pay | Admitting: Adult Health

## 2017-06-01 ENCOUNTER — Other Ambulatory Visit: Payer: Self-pay | Admitting: Neurology

## 2017-06-01 ENCOUNTER — Encounter: Payer: Self-pay | Admitting: Adult Health

## 2017-06-01 MED ORDER — HYDROCODONE-ACETAMINOPHEN 5-325 MG PO TABS: 1.0000 | ORAL_TABLET | Freq: Four times a day (QID) | ORAL | 0 refills | Status: DC | PRN

## 2017-06-01 NOTE — Telephone Encounter (Signed)
Patient sent my chart message requesting hydrocodone refill. Last refill on 04/25/17.

## 2017-06-01 NOTE — Telephone Encounter (Signed)
Renewed.

## 2017-06-02 ENCOUNTER — Ambulatory Visit (INDEPENDENT_AMBULATORY_CARE_PROVIDER_SITE_OTHER): Payer: BLUE CROSS/BLUE SHIELD | Admitting: Family Medicine

## 2017-06-02 ENCOUNTER — Encounter: Payer: Self-pay | Admitting: Family Medicine

## 2017-06-02 VITALS — BP 124/88 | HR 95 | Temp 97.9°F | Ht 62.0 in | Wt 217.0 lb

## 2017-06-02 DIAGNOSIS — F419 Anxiety disorder, unspecified: Secondary | ICD-10-CM | POA: Diagnosis not present

## 2017-06-02 DIAGNOSIS — R0789 Other chest pain: Secondary | ICD-10-CM

## 2017-06-02 MED ORDER — HYDROCODONE-ACETAMINOPHEN 5-325 MG PO TABS: 1.0000 | ORAL_TABLET | Freq: Four times a day (QID) | ORAL | 0 refills | Status: DC | PRN

## 2017-06-02 NOTE — Progress Notes (Signed)
Subjective:  [name] is a 41 y.o. year old very pleasant female patient who presents for/with See problem oriented charting ROS- No abnormal diaphoresis, no exertional chest pain, no shortness of breath, no left arm or neck pain . Admits to higher levels of stress.   Past Medical History-  Patient Active Problem List   Diagnosis Date Noted  . Leucocytosis 12/09/2015    Priority: High  . Inflammatory arthritis 12/09/2015    Priority: High  . Diabetes mellitus type II, controlled (HCC) 03/25/2012    Priority: High  . Hyperprolactinemia (HCC) 03/25/2012    Priority: High  . 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 Migraine headache 03/25/2012    Priority: High  . Hyperlipidemia 08/29/2016    Priority: Medium  . Elevated uric acid in blood 05/10/2015    Priority: Medium  . Anemia 01/01/2014    Priority: Medium  . Insomnia 01/04/2013    Priority: Medium  . Polycystic ovaries 03/25/2012    Priority: Medium  . Adult body mass index 37.0-37.9 03/25/2012    Priority: Low  . Cushing's syndrome-iatrogenic 03/25/2012    Priority: Low  . Allergic rhinitis due to pollen 03/25/2012    Priority: Low  . GERD (gastroesophageal reflux disease) 03/25/2012    Priority: Low  . Primary osteoarthritis of both hands 01/18/2017  . Primary osteoarthritis of both knees 01/18/2017  . Primary osteoarthritis of both feet 01/18/2017  . Plantar fasciitis 01/18/2017  . High risk medication use 01/18/2017    Medications- reviewed and updated Current Outpatient Prescriptions  Medication Sig Dispense Refill  . aspirin-acetaminophen-caffeine (EXCEDRIN MIGRAINE) 250-250-65 MG tablet Take 2 tablets by mouth every 6 (six) hours as needed for headache.    . Bepotastine Besilate (BEPREVE) 1.5 % SOLN Place 1 drop into both eyes as needed (for allergies).     . cetirizine (ZYRTEC) 10 MG tablet Take 10 mg by mouth at bedtime as needed for allergies.     . clonazePAM  (KLONOPIN) 0.5 MG tablet Take 0.5 tablets (0.25 mg total) by mouth at bedtime as needed for anxiety. 60 tablet 1  . cyclobenzaprine (FLEXERIL) 10 MG tablet TAKE 1 TABLET (10 MG TOTAL) BY MOUTH AT BEDTIME. 30 tablet 5  . Diclofenac Potassium 50 MG PACK Take 50 mg by mouth as needed (for migraines).    . dihydroergotamine (MIGRANAL) 4 MG/ML nasal spray Place 1 spray into the nose as needed for migraine. Use in one nostril as directed.  No more than 4 sprays in one hour    . eletriptan (RELPAX) 40 MG tablet Take 1 tablet (40 mg total) by mouth as needed for migraine or headache. May repeat in 2 hours if headache persists or recurs. 10 tablet 11  . Fe Fum-Vit C-Vit B12-FA (TRIGELS-F) 460-60-0.01-1 MG CAPS capsule Take 1 capsule by mouth every other day.    . folic acid (FOLVITE) 1 MG tablet Take 2 tablets (2 mg total) by mouth daily. 180 tablet 3  . HYDROcodone-acetaminophen (NORCO/VICODIN) 5-325 MG tablet Take 1 tablet by mouth every 6 (six) hours as needed for moderate pain. 10 tablet 0  . HYDROcodone-acetaminophen (NORCO/VICODIN) 5-325 MG tablet Take 1 tablet by mouth every 6 (six) hours as needed for moderate pain. 10 tablet 0  . methotrexate (RHEUMATREX) 2.5 MG tablet TAKE 4 TABLETS ONCE A WEEK FOR 2 WKS, 6 TABS ONCE WK FOR 2WKS, THEN 8 TABS ONCE A WEEK 84 tablet 0  . Multiple Vitamin (MULITIVITAMIN WITH MINERALS) TABS  Take 1 tablet by mouth daily.    . nortriptyline (PAMELOR) 10 MG capsule Take 4 capsules (40 mg total) by mouth at bedtime. 120 capsule 5  . omeprazole (PRILOSEC) 20 MG capsule Take 20 mg by mouth at bedtime.     . ondansetron (ZOFRAN) 4 MG tablet Take 1 tablet (4 mg total) by mouth every 8 (eight) hours as needed for nausea or vomiting. 20 tablet 5  . propranolol ER (INDERAL LA) 80 MG 24 hr capsule TAKE 1 CAPSULE (80 MG TOTAL) BY MOUTH DAILY. 30 capsule 5  . Norethindrone-Ethinyl Estradiol-Fe Biphas (LO LOESTRIN FE) 1 MG-10 MCG / 10 MCG tablet Take 1 tablet by mouth daily.     No  current facility-administered medications for this visit.     Objective: BP 124/88 (BP Location: Left Arm, Patient Position: Sitting, Cuff Size: Large)   Pulse 95   Temp 97.9 F (36.6 C) (Oral)   Ht 5\' 2"  (1.575 m)   Wt 217 lb (98.4 kg)   SpO2 93%   BMI 39.69 kg/m  Gen: NAD, resting comfortably No chest wall pain CV: RRR no murmurs rubs or gallops Lungs: CTAB no crackles, wheeze, rhonchi Abdomen: soft/nontender/nondistended/normal bowel sounds. No rebound or guarding.  Ext: no edema Skin: warm, dry, alopecia noted Normal strength bilateral arms  Assessment/Plan:  Atypical chest pain Anxiety S: upper chest/low throat pain starting about a month ago in the evenings. Has taken an antacid (Tums)- would help short term then would come back. Mildest of pressure- feels like gas in high chest. Seems to be mainly in the evening and after meals.  3/10 pain- not really a burning. Took clonazepam for last 3 days when she gets home and has had no symptoms. Also on nortiptyline 40mg  nightly per neuro. Prilosec at night when she gets home but does not take before her evening meal.   Stressors- work has been very busy and has had to work extra hours. Grandmother in hospice with heart failure- doing relief on weekends for 24 hour care that is being given by family. She is called for medical advice instead of calling hospice nurses. Dog is diabetic- has had low blood sugar. A few more migraines than normal with these stressors. Not sleeping as well. Doing overeating/stress eating.   To try to manage stress-Doing- Massage every other week, accupuncture once a month, aromatherapy at home.   A/P: GAD7 of 16 today and PHQ9 of 15. High probability this is stress related. Relief with tums and location of pain also suggest potential GERD. No exertional symptoms or other preexisting  to suggest CAD (does have DM as risk factor)   I suggested SSRI but she prefers not to take daily medications. She should move  her PPI to before her evening meal (which is often 1-2 PM as she works late shift). She asks about using clonazepam sparingly (has narcotics though small # at home and particularly discussed not mixing these0- I told her this was ok but would prefer that she not use this regularly- and if she is we should use daily SSRI.  Patient Instructions  Move prilosec to before evening meal (so even 1-2 PM)  I think pain is combo of reflux and anxiety.   Can use sparing clonazepam. We could use daily antidepressant using level of stress/anxiety you have but you preferred to hold off for now.   Could also use counseling if you had time.   See back for new or worsening symptoms immediately  Return precautions advised.  Garret Reddish, MD

## 2017-06-02 NOTE — Patient Instructions (Signed)
Move prilosec to before evening meal (so even 1-2 PM)  I think pain is combo of reflux and anxiety.   Can use sparing clonazepam. We could use daily antidepressant using level of stress/anxiety you have but you preferred to hold off for now.   Could also use counseling if you had time.   See [place] back for new or worsening symptoms immediately

## 2017-06-16 LAB — HM DIABETES EYE EXAM

## 2017-06-22 ENCOUNTER — Encounter: Payer: Self-pay | Admitting: Family Medicine

## 2017-07-02 ENCOUNTER — Other Ambulatory Visit: Payer: Self-pay | Admitting: Neurology

## 2017-07-03 ENCOUNTER — Encounter: Payer: Self-pay | Admitting: *Deleted

## 2017-07-03 ENCOUNTER — Other Ambulatory Visit: Payer: Self-pay | Admitting: *Deleted

## 2017-07-03 MED ORDER — HYDROCODONE-ACETAMINOPHEN 5-325 MG PO TABS: 1.0000 | ORAL_TABLET | Freq: Four times a day (QID) | ORAL | 0 refills | Status: DC | PRN

## 2017-07-04 ENCOUNTER — Encounter: Payer: Self-pay | Admitting: Rheumatology

## 2017-07-05 NOTE — Telephone Encounter (Signed)
Ok to wait until her appt. at [facility].

## 2017-07-11 ENCOUNTER — Encounter: Payer: Self-pay | Admitting: Family Medicine

## 2017-07-20 ENCOUNTER — Other Ambulatory Visit: Payer: Self-pay | Admitting: *Deleted

## 2017-07-20 DIAGNOSIS — Z79899 Other long term (current) drug therapy: Secondary | ICD-10-CM

## 2017-07-20 LAB — CBC WITH DIFFERENTIAL/PLATELET
BASOS ABS: 57 {cells}/uL (ref 0–200)
Basophils Relative: 0.4 %
EOS ABS: 611 {cells}/uL — AB (ref 15–500)
Eosinophils Relative: 4.3 %
HEMATOCRIT: 37.9 % (ref 35.0–45.0)
HEMOGLOBIN: 11.7 g/dL (ref 11.7–15.5)
Lymphs Abs: 4856 cells/uL — ABNORMAL HIGH (ref 850–3900)
MCH: 24 pg — AB (ref 27.0–33.0)
MCHC: 30.9 g/dL — AB (ref 32.0–36.0)
MCV: 77.7 fL — AB (ref 80.0–100.0)
MONOS PCT: 4.2 %
MPV: 10.3 fL (ref 7.5–12.5)
NEUTROS ABS: 8080 {cells}/uL — AB (ref 1500–7800)
Neutrophils Relative %: 56.9 %
Platelets: 480 10*3/uL — ABNORMAL HIGH (ref 140–400)
RBC: 4.88 10*6/uL (ref 3.80–5.10)
RDW: 15.7 % — ABNORMAL HIGH (ref 11.0–15.0)
Total Lymphocyte: 34.2 %
WBC: 14.2 10*3/uL — ABNORMAL HIGH (ref 3.8–10.8)
WBCMIX: 596 {cells}/uL (ref 200–950)

## 2017-07-20 LAB — COMPLETE METABOLIC PANEL WITH GFR
AG RATIO: 1.9 (calc) (ref 1.0–2.5)
ALBUMIN MSPROF: 4.3 g/dL (ref 3.6–5.1)
ALKALINE PHOSPHATASE (APISO): 99 U/L (ref 33–115)
ALT: 14 U/L (ref 6–29)
AST: 15 U/L (ref 10–30)
BILIRUBIN TOTAL: 0.4 mg/dL (ref 0.2–1.2)
BUN: 8 mg/dL (ref 7–25)
CHLORIDE: 99 mmol/L (ref 98–110)
CO2: 26 mmol/L (ref 20–32)
Calcium: 9.4 mg/dL (ref 8.6–10.2)
Creat: 0.67 mg/dL (ref 0.50–1.10)
GFR, Est African American: 126 mL/min/{1.73_m2} (ref >=60)
GFR, Est Non African American: 108 mL/min/{1.73_m2} (ref >=60)
GLOBULIN: 2.3 g/dL (ref 1.9–3.7)
Glucose, Bld: 208 mg/dL — ABNORMAL HIGH (ref 65–99)
Potassium: 4.5 mmol/L (ref 3.5–5.3)
SODIUM: 138 mmol/L (ref 135–146)
Total Protein: 6.6 g/dL (ref 6.1–8.1)

## 2017-07-21 ENCOUNTER — Other Ambulatory Visit: Payer: Self-pay | Admitting: Rheumatology

## 2017-07-21 NOTE — Progress Notes (Signed)
WBCs are still elevated ,rest of the labs are within normal limits.

## 2017-07-21 NOTE — Telephone Encounter (Addendum)
Last Visit:: 04/27/17 Next Visit due in October. Left message for patient to advise she needs to schedule follow up appointment.   Labs: 07/20/17 WBCs are still elevated ,rest of the labs are within normal limits.  Okay to refill per Dr. Deveshwar 

## 2017-08-01 ENCOUNTER — Other Ambulatory Visit: Payer: Self-pay | Admitting: Neurology

## 2017-08-03 ENCOUNTER — Other Ambulatory Visit: Payer: Self-pay | Admitting: Neurology

## 2017-08-03 ENCOUNTER — Encounter: Payer: Self-pay | Admitting: Adult Health

## 2017-08-03 MED ORDER — HYDROCODONE-ACETAMINOPHEN 5-325 MG PO TABS: 1.0000 | ORAL_TABLET | Freq: Four times a day (QID) | ORAL | 0 refills | Status: DC | PRN

## 2017-08-29 ENCOUNTER — Encounter: Payer: Self-pay | Admitting: Adult Health

## 2017-08-29 MED ORDER — HYDROCODONE-ACETAMINOPHEN 5-325 MG PO TABS: 1.0000 | ORAL_TABLET | Freq: Four times a day (QID) | ORAL | 0 refills | Status: DC | PRN

## 2017-08-29 NOTE — Telephone Encounter (Signed)
Hydrocodone prescription was printed and the patient can pick up on 11/9.  I did check the [place] drug registry.  The patient is not receiving any other narcotic medications at this time.

## 2017-09-01 ENCOUNTER — Ambulatory Visit: Payer: BLUE CROSS/BLUE SHIELD | Admitting: Family Medicine

## 2017-09-27 ENCOUNTER — Encounter: Payer: Self-pay | Admitting: Family Medicine

## 2017-09-29 ENCOUNTER — Other Ambulatory Visit: Payer: Self-pay | Admitting: Adult Health

## 2017-09-29 MED ORDER — HYDROCODONE-ACETAMINOPHEN 5-325 MG PO TABS: 1.0000 | ORAL_TABLET | Freq: Four times a day (QID) | ORAL | 0 refills | Status: DC | PRN

## 2017-09-29 NOTE — Telephone Encounter (Signed)
LMVM for pt that prescription ready for pick up, vicodin.  Placed up front.

## 2017-10-02 ENCOUNTER — Ambulatory Visit: Payer: Self-pay | Admitting: Family Medicine

## 2017-10-03 ENCOUNTER — Other Ambulatory Visit: Payer: Self-pay | Admitting: Rheumatology

## 2017-10-04 NOTE — Telephone Encounter (Signed)
Last Visit:: 04/27/17 Next Visit due in October. Left message for patient to advise she needs to schedule follow up appointment.   Labs: 07/20/17 WBCs are still elevated ,rest of the labs are within normal limits.  Okay to refill per Dr. Corliss Skains

## 2017-10-06 NOTE — Telephone Encounter (Addendum)
Patient confirmed that she is on 8 tabs per week.

## 2017-10-06 NOTE — Telephone Encounter (Signed)
Attempted to contact the patient. Need to verify dose of MTX before refill medication.

## 2017-10-30 ENCOUNTER — Ambulatory Visit: Payer: BLUE CROSS/BLUE SHIELD | Admitting: Neurology

## 2017-10-31 ENCOUNTER — Telehealth: Payer: Self-pay | Admitting: *Deleted

## 2017-10-31 ENCOUNTER — Other Ambulatory Visit: Payer: Self-pay

## 2017-10-31 ENCOUNTER — Other Ambulatory Visit: Payer: Self-pay | Admitting: Neurology

## 2017-10-31 MED ORDER — CYCLOBENZAPRINE HCL 10 MG PO TABS: 10.0000 mg | ORAL_TABLET | Freq: Every day | ORAL | 5 refills | Status: DC

## 2017-10-31 MED ORDER — HYDROCODONE-ACETAMINOPHEN 5-325 MG PO TABS: 1.0000 | ORAL_TABLET | Freq: Four times a day (QID) | ORAL | 0 refills | Status: DC | PRN

## 2017-10-31 NOTE — Telephone Encounter (Signed)
Received refill request from pharmacy for hydrocodone.  Last rx for #10 filled on 09/29/17.  She is current on appts and no issues noted on [place] narcotic registry.  She is provided #10 per 30 days.  Rx printed, signed and placed up front.  The pharmacy has notified her that it is at our office and ready for pick up.

## 2017-11-01 ENCOUNTER — Encounter: Payer: Self-pay | Admitting: Adult Health

## 2017-11-16 ENCOUNTER — Other Ambulatory Visit: Payer: Self-pay | Admitting: Neurology

## 2017-11-16 ENCOUNTER — Other Ambulatory Visit: Payer: Self-pay | Admitting: Adult Health

## 2017-12-01 ENCOUNTER — Other Ambulatory Visit: Payer: Self-pay | Admitting: *Deleted

## 2017-12-01 ENCOUNTER — Other Ambulatory Visit: Payer: Self-pay | Admitting: Neurology

## 2017-12-01 MED ORDER — HYDROCODONE-ACETAMINOPHEN 5-325 MG PO TABS: 1.0000 | ORAL_TABLET | Freq: Four times a day (QID) | ORAL | 0 refills | Status: DC | PRN

## 2017-12-01 NOTE — Telephone Encounter (Signed)
Hydrocodone refill RX at front desk for pick up.

## 2017-12-01 NOTE — Telephone Encounter (Signed)
Attempted to refill Hydrocodone. Printed on plain paper, not Rx paper. Will route request to Dr Terrace Arabia to refill. Rx will be placed at front desk for pick up after reordered and signed.

## 2017-12-01 NOTE — Telephone Encounter (Signed)
10 tabs per 30 days Norco/Vicodin 5/325mg  one every 6 hours.

## 2017-12-01 NOTE — Addendum Note (Signed)
Addended by: Levert Feinstein on: 12/01/2017 11:29 AM   Modules accepted: Orders

## 2017-12-21 ENCOUNTER — Encounter: Payer: Self-pay | Admitting: Neurology

## 2017-12-21 ENCOUNTER — Telehealth: Payer: Self-pay | Admitting: *Deleted

## 2017-12-21 NOTE — Telephone Encounter (Signed)
Dr. Terrace Arabia reviewed email.  She does not have any appointments next week but she suggested patient be seen by NP.  She has seen Megan in the past.  Returned call to let her know we could schedule an appt to discuss her migraine management.  She has pending appt with Megan on 12/26/17.    Email from patient:  My migraines seem to have worsened the last few months. The only real tangeable change was my new General physician said the Clonazepam .05 medicine was a bad drug and will not rx it anymore. He wouldn't give me an explanation and I had been on it for over 10 years to help with my sleep. So I'm not sure if that is the cause in having 15 or more migraines a month lately or not. I have read about the drug Aimovig 70mg  injection and it has good reviews. I don't know if you allow me to try a drug without seeing you first and I totally understand if you would rather wait and see me in March 12. I do have other things I would like to talk to you about for migraines when I see you. If you need to call me, after 12p is better as I do not get home until after 2a. Messaging if possible would be wonderful. Thanks   March 14

## 2017-12-26 ENCOUNTER — Ambulatory Visit: Payer: BLUE CROSS/BLUE SHIELD | Admitting: Adult Health

## 2017-12-26 ENCOUNTER — Encounter: Payer: Self-pay | Admitting: Adult Health

## 2017-12-26 VITALS — BP 149/110 | HR 97 | Wt 209.8 lb

## 2017-12-26 DIAGNOSIS — G43009 Migraine without aura, not intractable, without status migrainosus: Secondary | ICD-10-CM

## 2017-12-26 MED ORDER — ONDANSETRON HCL 4 MG PO TABS: 4.0000 mg | ORAL_TABLET | Freq: Three times a day (TID) | ORAL | 5 refills | Status: DC | PRN

## 2017-12-26 MED ORDER — HYDROCODONE-ACETAMINOPHEN 5-325 MG PO TABS: 1.0000 | ORAL_TABLET | Freq: Four times a day (QID) | ORAL | 0 refills | Status: DC | PRN

## 2017-12-26 MED ORDER — FREMANEZUMAB-VFRM 225 MG/1.5ML ~~LOC~~ SOSY: 225.0000 mg | PREFILLED_SYRINGE | SUBCUTANEOUS | 5 refills | Status: DC

## 2017-12-26 NOTE — Patient Instructions (Signed)
Your Plan:  Continue nortriptyline and Propranolol Continue Relpax and Vicodin for acute therapy Start Ajovy If your symptoms worsen or you develop new symptoms please let us know.   Thank you for coming to see [place] at [facility]. I hope we have been able to provide you high quality care today.  You may receive a patient satisfaction survey over the next few weeks. We would appreciate your feedback and comments so that we may continue to improve ourselves and the health of our patients.
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
## 2017-12-27 ENCOUNTER — Telehealth: Payer: Self-pay

## 2017-12-27 ENCOUNTER — Telehealth: Payer: Self-pay | Admitting: *Deleted

## 2017-12-27 DIAGNOSIS — Z0289 Encounter for other administrative examinations: Secondary | ICD-10-CM

## 2017-12-27 NOTE — Telephone Encounter (Signed)
[employer] forms on [employer] for review, signature.

## 2017-12-27 NOTE — Telephone Encounter (Signed)
Pt FMLA form on [employer] desk

## 2017-12-27 NOTE — Telephone Encounter (Signed)
We received a prior authorization request for this medication. I have completed and submitted the PA on Cover My Meds.  [id number]:[id number];Review Type:Prior Auth;Coverage Start Date:11/27/2017;Coverage End Date:12/27/2018

## 2017-12-29 NOTE — Progress Notes (Signed)
I have reviewed and agreed above plan. 

## 2018-01-01 ENCOUNTER — Encounter: Payer: Self-pay | Admitting: Adult Health

## 2018-01-02 ENCOUNTER — Ambulatory Visit: Payer: BLUE CROSS/BLUE SHIELD | Admitting: Neurology

## 2018-01-02 NOTE — Telephone Encounter (Signed)
Pt [employer] form faxed to [phone]

## 2018-01-10 ENCOUNTER — Encounter: Payer: Self-pay | Admitting: Adult Health

## 2018-01-15 ENCOUNTER — Encounter: Payer: Self-pay | Admitting: Adult Health

## 2018-01-16 ENCOUNTER — Other Ambulatory Visit: Payer: Self-pay

## 2018-01-16 DIAGNOSIS — Z79899 Other long term (current) drug therapy: Secondary | ICD-10-CM

## 2018-01-16 LAB — COMPLETE METABOLIC PANEL WITH GFR
AG RATIO: 1.8 (calc) (ref 1.0–2.5)
ALKALINE PHOSPHATASE (APISO): 106 U/L (ref 33–115)
ALT: 32 U/L — AB (ref 6–29)
AST: 34 U/L — AB (ref 10–30)
Albumin: 4.4 g/dL (ref 3.6–5.1)
BUN: 7 mg/dL (ref 7–25)
CO2: 26 mmol/L (ref 20–32)
CREATININE: 0.81 mg/dL (ref 0.50–1.10)
Calcium: 9.8 mg/dL (ref 8.6–10.2)
Chloride: 98 mmol/L (ref 98–110)
GFR, Est African American: 104 mL/min/{1.73_m2} (ref >=60)
GFR, Est Non African American: 90 mL/min/{1.73_m2} (ref >=60)
Globulin: 2.5 g/dL (calc) (ref 1.9–3.7)
Glucose, Bld: 296 mg/dL — ABNORMAL HIGH (ref 65–99)
POTASSIUM: 4.9 mmol/L (ref 3.5–5.3)
SODIUM: 135 mmol/L (ref 135–146)
Total Bilirubin: 0.4 mg/dL (ref 0.2–1.2)
Total Protein: 6.9 g/dL (ref 6.1–8.1)

## 2018-01-16 LAB — CBC WITH DIFFERENTIAL/PLATELET
BASOS ABS: 52 {cells}/uL (ref 0–200)
Basophils Relative: 0.4 %
EOS PCT: 3 %
Eosinophils Absolute: 387 cells/uL (ref 15–500)
HEMATOCRIT: 39 % (ref 35.0–45.0)
Hemoglobin: 12.7 g/dL (ref 11.7–15.5)
Lymphs Abs: 3406 cells/uL (ref 850–3900)
MCH: 25.1 pg — ABNORMAL LOW (ref 27.0–33.0)
MCHC: 32.6 g/dL (ref 32.0–36.0)
MCV: 77.2 fL — ABNORMAL LOW (ref 80.0–100.0)
MONOS PCT: 4.4 %
MPV: 10.8 fL (ref 7.5–12.5)
NEUTROS PCT: 65.8 %
Neutro Abs: 8488 cells/uL — ABNORMAL HIGH (ref 1500–7800)
PLATELETS: 464 10*3/uL — AB (ref 140–400)
RBC: 5.05 10*6/uL (ref 3.80–5.10)
RDW: 15.8 % — AB (ref 11.0–15.0)
TOTAL LYMPHOCYTE: 26.4 %
WBC mixed population: 568 cells/uL (ref 200–950)
WBC: 12.9 10*3/uL — AB (ref 3.8–10.8)

## 2018-01-16 NOTE — Addendum Note (Signed)
Addended by: Leighton Roach on: 01/16/2018 03:29 PM   Modules accepted: Orders

## 2018-01-17 ENCOUNTER — Encounter: Payer: Self-pay | Admitting: *Deleted

## 2018-01-17 NOTE — Progress Notes (Signed)
LFTs are mildly elevated.  She should avoid alcohol and NSAIDs.  If she is not taking any of these products then she should decrease her methotrexate to 7 tablets p.o. weekly.

## 2018-01-18 ENCOUNTER — Other Ambulatory Visit: Payer: Self-pay | Admitting: Rheumatology

## 2018-01-18 ENCOUNTER — Encounter: Payer: Self-pay | Admitting: Adult Health

## 2018-01-18 NOTE — Telephone Encounter (Signed)
Received call back from patient who stated that her employer only accepts FMLA for one year, not 6 months. She stated she is unsure if she'll do better on new medications. She is still wanting to have 3-6 episodes/monthly lasting 1-2 days. She stated she drives [address] minutes [address] to work and cannot take Hydrocodone to help relieve migraines if she has to drive.  This RN stated will let NP know. She verbalized understanding.

## 2018-01-18 NOTE — Telephone Encounter (Addendum)
Patient states she does not need prescription refilled.

## 2018-01-18 NOTE — Progress Notes (Signed)
Office Visit Note  Patient: [name]             Date of Birth: [birth year]           MRN: [id number]             PCP: Shelva Majestic, MD Referring: Shelva Majestic, MD Visit Date: 01/22/2018 Occupation: @GUAROCC @    Subjective:  Pain in multiple joints   History of Present Illness: [name] is a 42 y.o. female with history of inflammatory arthritis.  She is been on methotrexate for the treatment of her arthritis.  She went for a second opinion to [facility] in November 2018.  Where she had a thorough evaluation and was advised to get MRI of her left elbow.  Patient states she did not want to go back to [facility] for MRI wanted to get MRI over here.  She was on methotrexate until a week ago.  She continues to have warmth and swelling in her left elbow and bilateral ankles.  She has occasional discomfort in her knee joints.  End of the other joints are painful.     Activities of Daily Living:  Patient reports morning stiffness for 10 minutes.   Patient Denies nocturnal pain.  Difficulty dressing/grooming: Denies Difficulty climbing stairs: Reports Difficulty getting out of chair: Denies Difficulty using hands for taps, buttons, cutlery, and/or writing: Denies   Review of Systems  Constitutional: Positive for fatigue. Negative for night sweats, weight gain and weight loss.  HENT: Positive for mouth dryness. Negative for mouth sores, trouble swallowing, trouble swallowing and nose dryness.        Medication related  Eyes: Negative for pain, redness, visual disturbance and dryness.  Respiratory: Negative for cough, shortness of breath and difficulty breathing.   Cardiovascular: Negative for chest pain, palpitations, hypertension, irregular heartbeat and swelling in legs/feet.  Gastrointestinal: Positive for diarrhea. Negative for blood in stool and constipation.       H/o cholecystectomy  Endocrine: Negative for increased urination.  Genitourinary:  Negative for vaginal dryness.  Musculoskeletal: Positive for arthralgias, joint pain, joint swelling and morning stiffness. Negative for myalgias, muscle weakness, muscle tenderness and myalgias.  Skin: Positive for hair loss. Negative for color change, rash, skin tightness, ulcers and sensitivity to sunlight.  Allergic/Immunologic: Negative for susceptible to infections.  Neurological: Negative for dizziness, memory loss, night sweats and weakness.  Hematological: Negative for swollen glands.  Psychiatric/Behavioral: Positive for sleep disturbance. Negative for depressed mood. The patient is not nervous/anxious.     PMFS History:  Patient Active Problem List   Diagnosis Date Noted  . Primary osteoarthritis of both hands 01/18/2017  . Primary osteoarthritis of both knees 01/18/2017  . Primary osteoarthritis of both feet 01/18/2017  . Plantar fasciitis 01/18/2017  . High risk medication use 01/18/2017  . Hyperlipidemia 08/29/2016  . Leucocytosis 12/09/2015  . Inflammatory arthritis 12/09/2015  . Elevated uric acid in blood 05/10/2015  . Anemia 01/01/2014  . Insomnia 01/04/2013  . Polycystic ovaries 03/25/2012  . Diabetes mellitus type II, controlled (HCC) 03/25/2012  . Adult body mass index 37.0-37.9 03/25/2012  . Cushing's syndrome-iatrogenic 03/25/2012  . Hyperprolactinemia (HCC) 03/25/2012  . 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 Allergic rhinitis due to pollen 03/25/2012  . GERD (gastroesophageal reflux disease) 03/25/2012  . Migraine headache 03/25/2012    Past Medical History:  Diagnosis Date  . Allergy   . Alopecia areata totalis   . Anemia   . Anxiety   .  Arthritis   . Depression    meds for 1 year around loss of father  . GERD (gastroesophageal reflux disease)   . Hypertension   . Migraine   . Polycystic ovary disease     Family History  Problem Relation Age of Onset  . Hypertension Mother   . Hyperlipidemia Mother   . Breast cancer Mother        85  .  Hyperlipidemia Maternal Grandmother   . Hypertension Maternal Grandmother   . Heart failure Maternal Grandmother   . Brain cancer Father        glioblastoma  . Hyperlipidemia Father   . Hypertension Sister    Past Surgical History:  Procedure Laterality Date  . CHOLECYSTECTOMY    . DILATATION & CURETTAGE/HYSTEROSCOPY WITH MYOSURE N/A 08/26/2016   #2  Procedure: DILATATION & CURETTAGE/HYSTEROSCOPY WITH MYOSURE;  Surgeon: Jaymes Graff, MD;  Location: [facility];  Service: Gynecology;  Laterality: N/A;  . DILATION AND CURETTAGE OF UTERUS     #1  . NASAL SEPTUM SURGERY    . WISDOM TOOTH EXTRACTION     Social History   Social History Narrative    Married. No kids. 2 dogs, 3 cats. Lives at home with her husband.      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      Objective: Vital Signs: BP (!) 136/91 (BP Location: Right Arm, Patient Position: Sitting, Cuff Size: Normal)   Pulse 87   Resp 16   Ht 5\' 2"  (1.575 m)   Wt 210 lb 8 oz (95.5 kg)   BMI 38.50 kg/m    Physical Exam  Constitutional: She is oriented to person, place, and time. She appears well-developed and well-nourished.  HENT:  Head: Normocephalic and atraumatic.  Eyes: Conjunctivae and EOM are normal.  Neck: Normal range of motion.  Cardiovascular: Normal rate, regular rhythm, normal heart sounds and intact distal pulses.  Pulmonary/Chest: Effort normal and breath sounds normal.  Abdominal: Soft. Bowel sounds are normal.  Lymphadenopathy:    She has no cervical adenopathy.  Neurological: She is alert and oriented to person, place, and time.  Skin: Skin is warm and dry. Capillary refill takes less than 2 seconds.  Alopecia totalis  Psychiatric: She has a normal mood and affect. Her behavior is normal.  Nursing note and vitals reviewed.    Musculoskeletal Exam: C-spine thoracic lumbar spine good range of motion.  Shoulder joints elbows joints wrist joint MCPs PIPs DIPs were in good range of  motion with no synovitis.  Hip joints knee joints ankles MTPs PIPs DIPs were in good range of motion with no synovitis. CDAI Exam: CDAI Homunculus Exam:   Joint Counts:  CDAI Tender Joint count: 0 CDAI Swollen Joint count: 0  Global Assessments:  Patient Global Assessment: 3 Provider Global Assessment: 3  CDAI Calculated Score: 6    Investigation: No additional findings. CBC Latest Ref Rng & Units 01/16/2018 07/20/2017 03/27/2017  WBC 3.8 - 10.8 Thousand/uL 12.9(H) 14.2(H) 13.6(H)  Hemoglobin 11.7 - 15.5 g/dL 05/27/2017 35.0 10.9(L)  Hematocrit 35.0 - 45.0 % 39.0 37.9 34.1(L)  Platelets 140 - 400 Thousand/uL 464(H) 480(H) 370.0   CMP Latest Ref Rng & Units 01/16/2018 07/20/2017 03/27/2017  Glucose 65 - 99 mg/dL 05/27/2017) 818(E) 993(Z)  BUN 7 - 25 mg/dL 7 8 16   Creatinine 0.50 - 1.10 mg/dL 169(C 7.89  Sodium 135 - 146 mmol/L 135 138 138  Potassium 3.5 - 5.3 mmol/L 4.9 4.5 4.8  Chloride 98 - 110 mmol/L 98 99 103  CO2 20 - 32 mmol/L 26 26 26   Calcium 8.6 - 10.2 mg/dL 9.8 9.4 9.6  Total Protein 6.1 - 8.1 g/dL 6.9 6.6 6.7  Total Bilirubin 0.2 - 1.2 mg/dL 0.4 0.4 0.4  Alkaline Phos 39 - 117 U/L - - 89  AST 10 - 30 U/L 34(H) 15 11  ALT 6 - 29 U/L 32(H) 14 11    Imaging: No results found.  Speciality Comments: No specialty comments available.    Procedures:  No procedures performed Allergies: Darvocet [propoxyphene n-acetaminophen]; Felodipine er; Metformin and related; Prednisone; Imitrex [sumatriptan succinate]; and Latex   Assessment / Plan:     Visit Diagnoses: Inflammatory arthritis: Patient had a history of inflammatory arthritis.  She had elbow joint aspiration by orthopedic surgeon in the past.  She has been on methotrexate for a while now.  She had no synovitis on examination.  She discontinued methotrexate about a week ago.  She was seen by [facility] rheumatology for second opinion who recommended getting MRI of the left elbow.  Patient wants to stay off methotrexate for now so  she can develop arthritis again and she can get MRI.  Patient will notify me if she has a flare.  High risk medication use: She was on methotrexate.  She discontinued the methotrexate about a week ago.  She has only mild elevation of LFTs.  Which she relates to taking anti-inflammatories for migraines.  Primary osteoarthritis of both hands: Mild changes  Primary osteoarthritis of both knees: She does have osteoarthritis of her bilateral knee joints which causes some discomfort.  Primary osteoarthritis of both feet: Proper fitting shoes were discussed.  Alopecia (capitis) totalis: She has not seen a dermatologist at [facility] yet.  Other medical problems are listed as follows:  Primary insomnia  History of Cushing's syndrome  History of diabetes mellitus  History of hyperlipidemia    Orders: No orders of the defined types were placed in this encounter.  No orders of the defined types were placed in this encounter.   Follow-Up Instructions: Return in about 3 months (around 04/23/2018) for inflammatory arthritis.   Mary Savoy, MD  Note - This record has been created using Animal nutritionist.  Chart creation errors have been sought, but may not always  have been located. Such creation errors do not reflect on  the standard of medical care.

## 2018-01-18 NOTE — Telephone Encounter (Signed)
Per NP, LVM asking patient that since she is feeling better and is on new medication can her FMLA be redone to reflect less intermittent time off as in the past? Otherwise NP will only write letter to allow extended FMLA for 6 months, and then she must be re-evaluated.  Requested call back to advise.

## 2018-01-18 NOTE — Telephone Encounter (Signed)
I called the Patient.  After discussing with Dr. Terrace Arabia.  We will be changing her FMLA to reflect that she will get 6 episodes a month lasting 1 day.  I did explain to the patient that if she is having more headaches she should call and let us know.  She has started a Ajovy but is only had one injection.  We will continue to monitor she will call if she has a prolonged migraine.

## 2018-01-18 NOTE — Telephone Encounter (Signed)
Spoke with patient and asked her if her headaches have improved since restarting clonazepam and starting ajovy. She stated she had a 4 day stretch without a migraine soon after restarting clonazepam. She has noted improvement in her headaches, migraines. She also stated that sweating has stopped. This RN advised her the FMLA paper work is still in process, and this RN and NP are aware it's due by 01/25/18. Patient verbalized appreciation.

## 2018-01-19 ENCOUNTER — Encounter: Payer: Self-pay | Admitting: Adult Health

## 2018-01-19 ENCOUNTER — Encounter: Payer: Self-pay | Admitting: *Deleted

## 2018-01-22 ENCOUNTER — Encounter: Payer: Self-pay | Admitting: Rheumatology

## 2018-01-22 ENCOUNTER — Ambulatory Visit: Payer: BLUE CROSS/BLUE SHIELD | Admitting: Rheumatology

## 2018-01-22 VITALS — BP 136/91 | HR 87 | Resp 16 | Ht 62.0 in | Wt 210.5 lb

## 2018-01-22 DIAGNOSIS — Z8639 Personal history of other endocrine, nutritional and metabolic disease: Secondary | ICD-10-CM | POA: Diagnosis not present

## 2018-01-22 DIAGNOSIS — M1990 Unspecified osteoarthritis, unspecified site: Secondary | ICD-10-CM | POA: Diagnosis not present

## 2018-01-22 DIAGNOSIS — M19042 Primary osteoarthritis, left hand: Secondary | ICD-10-CM

## 2018-01-22 DIAGNOSIS — M19041 Primary osteoarthritis, right hand: Secondary | ICD-10-CM | POA: Diagnosis not present

## 2018-01-22 DIAGNOSIS — M170 Bilateral primary osteoarthritis of knee: Secondary | ICD-10-CM

## 2018-01-22 DIAGNOSIS — M19072 Primary osteoarthritis, left ankle and foot: Secondary | ICD-10-CM

## 2018-01-22 DIAGNOSIS — F5101 Primary insomnia: Secondary | ICD-10-CM

## 2018-01-22 DIAGNOSIS — Z79899 Other long term (current) drug therapy: Secondary | ICD-10-CM | POA: Diagnosis not present

## 2018-01-22 DIAGNOSIS — L630 Alopecia (capitis) totalis: Secondary | ICD-10-CM

## 2018-01-22 DIAGNOSIS — M19071 Primary osteoarthritis, right ankle and foot: Secondary | ICD-10-CM

## 2018-01-24 ENCOUNTER — Other Ambulatory Visit: Payer: Self-pay | Admitting: Rheumatology

## 2018-01-24 NOTE — Telephone Encounter (Signed)
Last Visit: 01/22/18 Next Visit: 05/07/18  Okay to refill per Dr. Corliss Skains

## 2018-01-26 ENCOUNTER — Other Ambulatory Visit: Payer: Self-pay | Admitting: Adult Health

## 2018-01-29 MED ORDER — HYDROCODONE-ACETAMINOPHEN 5-325 MG PO TABS: 1.0000 | ORAL_TABLET | Freq: Four times a day (QID) | ORAL | 0 refills | Status: DC | PRN

## 2018-02-26 ENCOUNTER — Other Ambulatory Visit: Payer: Self-pay | Admitting: Adult Health

## 2018-02-26 ENCOUNTER — Encounter: Payer: Self-pay | Admitting: Adult Health

## 2018-02-27 ENCOUNTER — Other Ambulatory Visit: Payer: Self-pay | Admitting: Neurology

## 2018-02-27 MED ORDER — HYDROCODONE-ACETAMINOPHEN 5-325 MG PO TABS: 1.0000 | ORAL_TABLET | Freq: Four times a day (QID) | ORAL | 0 refills | Status: DC | PRN

## 2018-02-27 NOTE — Addendum Note (Signed)
Addended by: Enedina Finner on: 02/27/2018 01:16 PM   Modules accepted: Orders

## 2018-03-12 ENCOUNTER — Other Ambulatory Visit: Payer: Self-pay | Admitting: Adult Health

## 2018-03-13 ENCOUNTER — Ambulatory Visit: Payer: Self-pay | Admitting: Rheumatology

## 2018-03-30 ENCOUNTER — Other Ambulatory Visit: Payer: Self-pay | Admitting: Adult Health

## 2018-04-02 ENCOUNTER — Other Ambulatory Visit: Payer: Self-pay | Admitting: Adult Health

## 2018-04-02 ENCOUNTER — Encounter: Payer: Self-pay | Admitting: Adult Health

## 2018-04-02 MED ORDER — HYDROCODONE-ACETAMINOPHEN 5-325 MG PO TABS: 1.0000 | ORAL_TABLET | Freq: Four times a day (QID) | ORAL | 0 refills | Status: DC | PRN

## 2018-04-02 NOTE — Telephone Encounter (Signed)
[place] drug registry checked. 

## 2018-04-02 NOTE — Telephone Encounter (Signed)
[place] Drug registry checked 

## 2018-04-11 ENCOUNTER — Encounter (HOSPITAL_COMMUNITY): Payer: Self-pay | Admitting: Family Medicine

## 2018-04-11 ENCOUNTER — Ambulatory Visit (HOSPITAL_COMMUNITY)
Admission: EM | Admit: 2018-04-11 | Discharge: 2018-04-11 | Disposition: A | Discharge status: Home / Self Care | Payer: BLUE CROSS/BLUE SHIELD | Attending: Urgent Care | Admitting: Urgent Care

## 2018-04-11 DIAGNOSIS — L600 Ingrowing nail: Secondary | ICD-10-CM

## 2018-04-11 DIAGNOSIS — M79674 Pain in right toe(s): Secondary | ICD-10-CM

## 2018-04-11 MED ORDER — CEPHALEXIN 500 MG PO CAPS: 500.0000 mg | ORAL_CAPSULE | Freq: Three times a day (TID) | ORAL | 0 refills | Status: DC

## 2018-04-11 NOTE — Discharge Instructions (Signed)
You may take 500mg  Tylenol with ibuprofen 400-600mg  every 6 hours for pain and inflammation. Use warm soaks for 15-20 minutes 3 times daily.

## 2018-04-11 NOTE — ED Provider Notes (Signed)
MRN: [id number] DOB: May 19, 1976  Subjective:   [name] is a 42 y.o. female presenting for 1 week history of worsening right great toenail pain, swelling.  Patient tries to keep her nails trimmed.  She feels like something is poking toward the inside of her skin.  Denies fever, nausea, vomiting, belly pain.  She has had an ingrown toenail before that required removal approximately 10 years ago.  No current facility-administered medications for this encounter.   Current Outpatient Medications:  .  Bepotastine Besilate (BEPREVE) 1.5 % SOLN, Place 1 drop into both eyes as needed (for allergies). , Disp: , Rfl:  .  cetirizine (ZYRTEC) 10 MG tablet, Take 10 mg by mouth at bedtime as needed for allergies. , Disp: , Rfl:  .  clonazePAM (KLONOPIN) 0.5 MG tablet, Take 0.5 tablets (0.25 mg total) by mouth at bedtime as needed for anxiety., Disp: 60 tablet, Rfl: 1 .  cyclobenzaprine (FLEXERIL) 10 MG tablet, Take 1 tablet (10 mg total) by mouth at bedtime., Disp: 30 tablet, Rfl: 5 .  Diclofenac Potassium 50 MG PACK, Take 50 mg by mouth as needed (for migraines)., Disp: , Rfl:  .  dihydroergotamine (MIGRANAL) 4 MG/ML nasal spray, Place 1 spray into the nose as needed for migraine. Use in one nostril as directed.  No more than 4 sprays in one hour, Disp: , Rfl:  .  eletriptan (RELPAX) 40 MG tablet, TAKE 1 TABLET BY MOUTH AS NEEDED FOR MIGRAINE/HEADACHE. MAY REPEAT IN 2 HOURS IF HEADACHE PERSISTS, Disp: 10 tablet, Rfl: 7 .  Fe Fum-Vit C-Vit B12-FA (TRIGELS-F) 460-60-0.01-1 MG CAPS capsule, Take 1 capsule by mouth every other day., Disp: , Rfl:  .  folic acid (FOLVITE) 1 MG tablet, TAKE 2 TABLETS (2 MG TOTAL) BY MOUTH DAILY., Disp: 180 tablet, Rfl: 3 .  Fremanezumab-vfrm (AJOVY) 225 MG/1.5ML SOSY, Inject 225 mg into the skin every 30 (thirty) days., Disp: 1.5 mL, Rfl: 5 .  HYDROcodone-acetaminophen (NORCO/VICODIN) 5-325 MG tablet, Take 1 tablet by mouth every 6 (six) hours as needed for moderate  pain., Disp: 10 tablet, Rfl: 0 .  methotrexate (RHEUMATREX) 2.5 MG tablet, Take 8 tablets (20 mg total) by mouth once a week., Disp: 96 tablet, Rfl: 0 .  Multiple Vitamin (MULITIVITAMIN WITH MINERALS) TABS, Take 1 tablet by mouth daily., Disp: , Rfl:  .  Norethindrone-Ethinyl Estradiol-Fe Biphas (LO LOESTRIN FE) 1 MG-10 MCG / 10 MCG tablet, Take 1 tablet by mouth as needed. , Disp: , Rfl:  .  nortriptyline (PAMELOR) 10 MG capsule, TAKE 4 CAPSULES (40 MG TOTAL) BY MOUTH AT BEDTIME., Disp: 360 capsule, Rfl: 1 .  omeprazole (PRILOSEC) 20 MG capsule, Take 20 mg by mouth at bedtime. , Disp: , Rfl:  .  ondansetron (ZOFRAN) 4 MG tablet, Take 1 tablet (4 mg total) by mouth every 8 (eight) hours as needed for nausea or vomiting., Disp: 20 tablet, Rfl: 5 .  propranolol ER (INDERAL LA) 80 MG 24 hr capsule, TAKE ONE CAPSULE BY MOUTH EVERY DAY, Disp: 30 capsule, Rfl: 5   Allergies  Allergen Reactions  . Darvocet [Propoxyphene N-Acetaminophen] Other (See Comments)    Reaction:  Unknown   . Felodipine Er Swelling and Other (See Comments)    Reaction:  Lower extremity swelling   . Metformin And Related     Diarrhea and vomitting   . Prednisone Swelling and Other (See Comments)    Reaction:  Facial swelling   . Imitrex [Sumatriptan Succinate] Palpitations and Other (See Comments)  Reaction:  Chest pain   . 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 Result was a rash.right. Glenard Haring, RN    Past Medical History:  Diagnosis Date  . Allergy   . Alopecia areata totalis   . Anemia   . Anxiety   . Arthritis   . Depression    meds for 1 year around loss of father  . GERD (gastroesophageal reflux disease)   . Hypertension   . Migraine   . Polycystic ovary disease      Past Surgical History:  Procedure Laterality Date  . CHOLECYSTECTOMY    . DILATATION & CURETTAGE/HYSTEROSCOPY WITH MYOSURE N/A 08/26/2016   #2   Procedure: DILATATION & CURETTAGE/HYSTEROSCOPY WITH MYOSURE;  Surgeon: Jaymes Graff, MD;  Location: [facility];  Service: Gynecology;  Laterality: N/A;  . DILATION AND CURETTAGE OF UTERUS     #1  . NASAL SEPTUM SURGERY    . WISDOM TOOTH EXTRACTION      Objective:   Vitals: BP (!) 144/93   Pulse 96   Resp 18   LMP 04/11/2018   SpO2 100%   Physical Exam  Constitutional: She is oriented to person, place, and time. She appears well-developed and well-nourished.  Cardiovascular: Normal rate.  Pulmonary/Chest: Effort normal.  Musculoskeletal:       Right foot: There is tenderness (mild over proximal tissue of right great toenail) and swelling (trace over lateral right great toenail). There is normal range of motion, no bony tenderness, normal capillary refill, no crepitus, no deformity and no laceration.  Neurological: She is alert and oriented to person, place, and time.   PROCEDURE NOTE: Verbal consent obtained.  Digital block performed of right great toe using approximately 5 cc plain lidocaine.  Right great toenail was cleansed with iodine.  A nail left ear was used 2 separate medial aspect of right great toenail.  Iris scissors were used to remove a very small portion of border of ingrown toenail.  Cleansed with alcohol prep pads and dressed.  Assessment and Plan :   Pain around toenail, right foot  Ingrown toenail of right foot  Counseled patient on high risk of infection and injury using equipment available to me today.  Patient insisted that we try instead of presenting to my primary care clinic.  We were minimally successful.  We will start Keflex and use warm soaks at home.  Return to clinic precautions discussed.   Wallis Bamberg, [place] 04/11/18 1275

## 2018-04-11 NOTE — ED Triage Notes (Signed)
Pt here for 1 week of right great toe pain due to an ingrown toe nail.

## 2018-04-23 NOTE — Progress Notes (Signed)
Office Visit Note  Patient: [name]             Date of Birth: 1976-09-02           MRN: [id number]             PCP: Shelva Majestic, MD Referring: Shelva Majestic, MD Visit Date: 05/07/2018 Occupation: @GUAROCC @    Subjective:  Pain in multiple joints   History of Present Illness: [name] is a 42 y.o. female with history of inflammatory arthritis and osteoarthritis.  Patient has been off of methotrexate since March 2019 developed faded LFTs.  [name] states [name] has not noticed much difference since being off of methotrexate.  States [name] continues to have pain in multiple joints including bilateral wrists, bilateral knees, bilateral ankle joints.  [name] states [name] also has swelling in her wrist joints and ankle joints.  [name] states that occasionally her left elbow swell but denies any pain or swelling at this time.  [name] states that [name] uses ice and limits her use when [name] does have discomfort.  Denies any recent joint contractures.  Patient reports that [name] will be following up with [facility] on 05/28/2018 for evaluation.  [name] would like to discuss trying 07/28/2018 for joint pain and hair loss.   [name] continues to have generalized muscle aches and muscle tenderness.  [name] experiences muscle tension, especially at night.  [name] reports taking Flexeril 10 mg at bedtime, which helps with muscle spasms and muscle tension.  [name] also goes for a massage twice a month.  [name] continues to have recurrent migraines.  [name] has had 6 migraines this month.  [name] states [name] was started on Ajovy in March 2019. [name] is having a migraine today and ranks the pain a 3/10.  [name] follows up with her neurologist every 6 months, and her next visit is in September.    Activities of Daily Living:  Patient reports morning stiffness for 0 minutes.   Patient Reports nocturnal pain.  Difficulty dressing/grooming: Denies Difficulty climbing stairs: Denies Difficulty getting out of chair:  Denies Difficulty using hands for taps, buttons, cutlery, and/or writing: Denies   Review of Systems  Constitutional: Positive for fatigue.  HENT: Positive for mouth dryness. Negative for mouth sores and nose dryness.   Eyes: Negative for pain, visual disturbance and dryness.  Respiratory: Negative for cough, hemoptysis, shortness of breath and difficulty breathing.   Cardiovascular: Negative for chest pain, palpitations, hypertension and swelling in legs/feet.  Gastrointestinal: Negative for blood in stool, constipation and diarrhea.  Endocrine: Negative for increased urination.  Genitourinary: Negative for painful urination.  Musculoskeletal: Positive for arthralgias, joint pain, joint swelling, myalgias, morning stiffness and myalgias. Negative for muscle weakness and muscle tenderness.  Skin: Positive for hair loss. Negative for color change, pallor, rash, nodules/bumps, skin tightness, ulcers and sensitivity to sunlight.  Allergic/Immunologic: Negative for susceptible to infections.  Neurological: Positive for headaches (Hx of migraines). Negative for dizziness, numbness and weakness.  Hematological: Negative for swollen glands.  Psychiatric/Behavioral: Negative for depressed mood and sleep disturbance. The patient is not nervous/anxious.     PMFS History:  Patient Active Problem List   Diagnosis Date Noted  . Primary osteoarthritis of both hands 01/18/2017  . Primary osteoarthritis of both knees 01/18/2017  . Primary osteoarthritis of both feet 01/18/2017  . Plantar fasciitis 01/18/2017  . High risk medication use 01/18/2017  . Hyperlipidemia 08/29/2016  . Leucocytosis 12/09/2015  . Inflammatory arthritis 12/09/2015  .  Elevated uric acid in blood 05/10/2015  . Anemia 01/01/2014  . Insomnia 01/04/2013  . Polycystic ovaries 03/25/2012  . Diabetes mellitus type II, controlled (HCC) 03/25/2012  . Adult body mass index 37.0-37.9 03/25/2012  . Cushing's syndrome-iatrogenic  03/25/2012  . Hyperprolactinemia (HCC) 03/25/2012  . 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 Allergic rhinitis due to pollen 03/25/2012  . GERD (gastroesophageal reflux disease) 03/25/2012  . Migraine headache 03/25/2012    Past Medical History:  Diagnosis Date  . Allergy   . Alopecia areata totalis   . Anemia   . Anxiety   . Arthritis   . Depression    meds for 1 year around loss of father  . GERD (gastroesophageal reflux disease)   . Hypertension   . Migraine   . Polycystic ovary disease     Family History  Problem Relation Age of Onset  . Hypertension Mother   . Hyperlipidemia Mother   . Breast cancer Mother        86  . Hyperlipidemia Maternal Grandmother   . Hypertension Maternal Grandmother   . Heart failure Maternal Grandmother   . Brain cancer Father        glioblastoma  . Hyperlipidemia Father   . Hypertension Sister    Past Surgical History:  Procedure Laterality Date  . CHOLECYSTECTOMY    . DILATATION & CURETTAGE/HYSTEROSCOPY WITH MYOSURE N/A 08/26/2016   #2  Procedure: DILATATION & CURETTAGE/HYSTEROSCOPY WITH MYOSURE;  Surgeon: Jaymes Graff, MD;  Location: [facility];  Service: Gynecology;  Laterality: N/A;  . DILATION AND CURETTAGE OF UTERUS     #1  . NASAL SEPTUM SURGERY    . WISDOM TOOTH EXTRACTION     Social History   Social History Narrative    Married. No kids. 2 dogs, 3 cats. Lives at home with her husband.      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      Objective: Vital Signs: BP (!) 132/91 (BP Location: Left Arm, Patient Position: Sitting, Cuff Size: Large)   Pulse 82   Resp 13   Ht 5\' 2"  (1.575 m)   Wt 207 lb (93.9 kg)   LMP 04/11/2018   BMI 37.86 kg/m    Physical Exam  Constitutional: [name] is oriented to person, place, and time. [name] appears well-developed and well-nourished.  HENT:  Head: Normocephalic and atraumatic.  Eyes: Conjunctivae and EOM are normal.  Neck: Normal range of motion.    Cardiovascular: Normal rate, regular rhythm, normal heart sounds and intact distal pulses.  Pulmonary/Chest: Effort normal and breath sounds normal.  Abdominal: Soft. Bowel sounds are normal.  Lymphadenopathy:    [name] has no cervical adenopathy.  Neurological: [name] is alert and oriented to person, place, and time.  Skin: Skin is warm and dry. Capillary refill takes less than 2 seconds.  Psychiatric: [name] has a normal mood and affect. Her behavior is normal.  Nursing note and vitals reviewed.    Musculoskeletal Exam: C-spine, thoracic spine, lumbar spine good range of motion.  No midline spinal tenderness.  Bilateral SI joint tenderness.  Shoulder joints, elbow joints, wrist joints, MCPs, PIPs, DIPs good range of motion with no synovitis.  Complete fist formation bilaterally.  Hip joints, knee joints, ankle joints, MTPs, PIPs, DIPs good range of motion with no synovitis.  No warmth or effusion of bilateral knee joints.  [name] has tenderness along bilateral ankle joint lines but no synovitis was noted.  No tenderness of trochanter bursa bilaterally at  this time.  CDAI Exam: CDAI Homunculus Exam:   Joint Counts:  CDAI Tender Joint count: 0 CDAI Swollen Joint count: 0  Global Assessments:  Patient Global Assessment: 3 Provider Global Assessment: 1  CDAI Calculated Score: 4    Investigation: No additional findings. CBC Latest Ref Rng & Units 01/16/2018 07/20/2017 03/27/2017  WBC 3.8 - 10.8 Thousand/uL 12.9(H) 14.2(H) 13.6(H)  Hemoglobin 11.7 - 15.5 g/dL 16.1 09.6 10.9(L)  Hematocrit 35.0 - 45.0 % 39.0 37.9 34.1(L)  Platelets 140 - 400 Thousand/uL 464(H) 480(H) 370.0   CMP Latest Ref Rng & Units 01/16/2018 07/20/2017 03/27/2017  Glucose 65 - 99 mg/dL 045(W) 098(J) 191(Y)  BUN 7 - 25 mg/dL 7 8 16   Creatinine 0.50 - 1.10 mg/dL 7.82 9.56 2.13  Sodium 135 - 146 mmol/L 135 138 138  Potassium 3.5 - 5.3 mmol/L 4.9 4.5 4.8  Chloride 98 - 110 mmol/L 98 99 103  CO2 20 - 32 mmol/L 26 26 26   Calcium  8.6 - 10.2 mg/dL 9.8 9.4 9.6  Total Protein 6.1 - 8.1 g/dL 6.9 6.6 6.7  Total Bilirubin 0.2 - 1.2 mg/dL 0.4 0.4 0.4  Alkaline Phos 39 - 117 U/L - - 89  AST 10 - 30 U/L 34(H) 15 11  ALT 6 - 29 U/L 32(H) 14 11    Imaging: No results found.  Speciality Comments: No specialty comments available.    Procedures:  No procedures performed Allergies: Darvocet [propoxyphene n-acetaminophen]; Felodipine er; Metformin and related; Prednisone; Imitrex [sumatriptan succinate]; and Latex   Assessment / Plan:     Visit Diagnoses: Inflammatory arthritis: [name] has no active synovitis.  [name] has been having joint pain in bilateral wrists, bilateral knees, bilateral ankles.  [name] has tenderness along bilateral ankle joint lines but no synovitis was noted.  [name] discontinued methotrexate in March 2019 due to elevated LFTs.  [name] will not resume methotrexate at this time due to not having any active synovitis.  [name] will be following up with [facility] on 05/28/2018.  [name] would like to discuss trying to start on Xeljanz for management of alopecia as well as inflammatory arthritis.  We do not feels that [name] needs to be on immunosuppression at this time due to not having active disease.  [name] will return in 6 months.  [name] was advised to notify us if [name] develops increased joint pain or joint swelling.   High risk medication use -[name] discontinued methotrexate in March 2019 due to elevated LFTs. [name] does not clinically need to be on MTX at this time.   Primary osteoarthritis of both hands: [name] has no synovitis on exam.  [name] is complete fist formation bilaterally.  [name] has no discomfort at this time.  Joint protection and muscle strengthening were discussed.  Primary osteoarthritis of both knees: No warmth or effusion.  [name] has good range of motion on exam.  [name] experiences discomfort in bilateral knee joints.  Primary osteoarthritis of both feet: [name] has no synovitis on exam.  [name] has tenderness along bilateral ankle  joint lines.  [name] wears proper fitting shoes.   Alopecia (capitis) totalis: [name] is following up with [facility] on 05/28/18. [name] was advised to notify us if any recommendations are made.    Myalgia: [name] has been having increased muscle aches and muscle tension.  [name] is in the process of switching PCPs and requested a refill of Flexeril.  We provided a 30 day supply and will not refill this medication since [name] will  be establishing care with a PCP.   We advised her to lower her dose if possible and sent in 5 mg instead of 10 mg tablets. [name] will take Flexeril 5 mg at bedtime PRN for muscle spasms.    Primary insomnia: Chronic   Other medical conditions are listed as follows:   History of diabetes mellitus  History of hyperlipidemia  History of Cushing's syndrome    Orders: No orders of the defined types were placed in this encounter.  Meds ordered this encounter  Medications  . cyclobenzaprine (FLEXERIL) 5 MG tablet    Sig: Take 1 tablet (5 mg total) at bedtime as needed for muscle spasms.    Dispense:  30 tablet    Refill:  0    Face-to-face time spent with patient was 30 minutes. Greater than 50% of time was spent in counseling and coordination of care.  Follow-Up Instructions: Return in about 6 months (around 11/07/2018) for Inflammatory arthritis , Osteoarthritis.   Gearldine Bienenstock, [name]  Note - This record has been created using Dragon software.  Chart creation errors have been sought, but may not always  have been located. Such creation errors do not reflect on  the standard of medical care.

## 2018-04-27 ENCOUNTER — Other Ambulatory Visit: Payer: Self-pay | Admitting: Adult Health

## 2018-04-30 NOTE — Telephone Encounter (Signed)
Patient has follow up scheduled. [place] drug registry checked.

## 2018-05-01 ENCOUNTER — Other Ambulatory Visit: Payer: Self-pay | Admitting: Neurology

## 2018-05-01 MED ORDER — HYDROCODONE-ACETAMINOPHEN 5-325 MG PO TABS: 1.0000 | ORAL_TABLET | Freq: Four times a day (QID) | ORAL | 0 refills | Status: DC | PRN

## 2018-05-07 ENCOUNTER — Ambulatory Visit: Payer: BLUE CROSS/BLUE SHIELD | Admitting: Physician Assistant

## 2018-05-07 ENCOUNTER — Encounter: Payer: Self-pay | Admitting: Physician Assistant

## 2018-05-07 VITALS — BP 132/91 | HR 82 | Resp 13 | Ht 62.0 in | Wt 207.0 lb

## 2018-05-07 DIAGNOSIS — Z79899 Other long term (current) drug therapy: Secondary | ICD-10-CM

## 2018-05-07 DIAGNOSIS — M1990 Unspecified osteoarthritis, unspecified site: Secondary | ICD-10-CM | POA: Diagnosis not present

## 2018-05-07 DIAGNOSIS — Z8639 Personal history of other endocrine, nutritional and metabolic disease: Secondary | ICD-10-CM

## 2018-05-07 DIAGNOSIS — M170 Bilateral primary osteoarthritis of knee: Secondary | ICD-10-CM

## 2018-05-07 DIAGNOSIS — M19041 Primary osteoarthritis, right hand: Secondary | ICD-10-CM

## 2018-05-07 DIAGNOSIS — M19072 Primary osteoarthritis, left ankle and foot: Secondary | ICD-10-CM

## 2018-05-07 DIAGNOSIS — M19042 Primary osteoarthritis, left hand: Secondary | ICD-10-CM

## 2018-05-07 DIAGNOSIS — L630 Alopecia (capitis) totalis: Secondary | ICD-10-CM

## 2018-05-07 DIAGNOSIS — M791 Myalgia, unspecified site: Secondary | ICD-10-CM

## 2018-05-07 DIAGNOSIS — M19071 Primary osteoarthritis, right ankle and foot: Secondary | ICD-10-CM

## 2018-05-07 DIAGNOSIS — F5101 Primary insomnia: Secondary | ICD-10-CM

## 2018-05-07 MED ORDER — CYCLOBENZAPRINE HCL 5 MG PO TABS: ORAL_TABLET | ORAL | 0 refills | Status: DC

## 2018-05-12 ENCOUNTER — Encounter: Payer: Self-pay | Admitting: Family Medicine

## 2018-05-14 NOTE — Telephone Encounter (Signed)
Chart updated and Dr. Durene Cal removed as PCP per pt request.

## 2018-05-20 ENCOUNTER — Encounter: Payer: Self-pay | Admitting: Rheumatology

## 2018-05-21 NOTE — Telephone Encounter (Signed)
Pt was prescribed a 30-day supply of Flexeril 5 mg until she was able to establish care with a PCP.  Dr. Corliss Skains recommended Flexeril 5 mg at bedtime, and did not want to refill this medication.  Please ask if she has an appointment scheduled with PCP. She should follow up with PCP to discuss evaluation and management of possible fibromyalgia.

## 2018-05-22 NOTE — Telephone Encounter (Signed)
I had detailed consult conversation with the patient.  She states she would like to get Flexeril 10 mg p.o. nightly until she establishes with a new PCP.  She still have 20 tablets left.  She will double the dose and take 10 mg p.o. nightly.  She will need a prescription for Flexeril in 10 days which we will fill at 10 mg p.o. nightly with 1 refill .  At this time she does not have any arthritis.  She states that she would prefer to follow-up with dermatologist and see if she can start on Xeljanz.  I have advised her to return on PRN basis in case she develops any arthritis.

## 2018-05-23 ENCOUNTER — Encounter: Payer: Self-pay | Admitting: Adult Health

## 2018-05-30 ENCOUNTER — Other Ambulatory Visit: Payer: Self-pay | Admitting: Adult Health

## 2018-05-30 MED ORDER — HYDROCODONE-ACETAMINOPHEN 5-325 MG PO TABS: 1.0000 | ORAL_TABLET | Freq: Four times a day (QID) | ORAL | 0 refills | Status: DC | PRN

## 2018-05-31 ENCOUNTER — Encounter: Payer: Self-pay | Admitting: Rheumatology

## 2018-05-31 MED ORDER — CYCLOBENZAPRINE HCL 10 MG PO TABS: 10.0000 mg | ORAL_TABLET | Freq: Every day | ORAL | 0 refills | Status: DC

## 2018-05-31 NOTE — Telephone Encounter (Signed)
Last Visit: 05/07/18 Next Visit: 11/07/18  Okay to refill per Dr. Deveshwar 

## 2018-06-01 ENCOUNTER — Telehealth: Payer: Self-pay | Admitting: Adult Health

## 2018-06-01 NOTE — Telephone Encounter (Signed)
I relayed to [place], that we are aware of her being on these medications.  She verbalized understanding.

## 2018-06-01 NOTE — Telephone Encounter (Signed)
[facility] [phone] called to advise the pt is already taking clonazePAM (KLONOPIN) 0.5 MG tablet and cyclobenzaprine (FLEXERIL) 10 MG tablet from different providers and these can also have a different effect when mixed with HYDROcodone-acetaminophen (NORCO/VICODIN) 5-325 MG tablet. Please call to advise.

## 2018-06-29 ENCOUNTER — Other Ambulatory Visit: Payer: Self-pay | Admitting: Rheumatology

## 2018-06-29 ENCOUNTER — Encounter: Payer: Self-pay | Admitting: Rheumatology

## 2018-06-29 ENCOUNTER — Other Ambulatory Visit: Payer: Self-pay | Admitting: Adult Health

## 2018-06-29 MED ORDER — HYDROCODONE-ACETAMINOPHEN 5-325 MG PO TABS: 1.0000 | ORAL_TABLET | Freq: Four times a day (QID) | ORAL | 0 refills | Status: DC | PRN

## 2018-06-29 NOTE — Telephone Encounter (Signed)
Last Visit: 05/07/18 Next Visit: 11/07/18  Okay to refill per Dr. Deveshwar 

## 2018-06-29 NOTE — Telephone Encounter (Signed)
Drug Registry checked.  Last fill for #10 on 06-01-18. vicodin 5-325 1 tab every 6 hr prn.

## 2018-07-04 ENCOUNTER — Encounter: Payer: Self-pay | Admitting: Adult Health

## 2018-07-04 ENCOUNTER — Ambulatory Visit: Payer: BLUE CROSS/BLUE SHIELD | Admitting: Adult Health

## 2018-07-04 ENCOUNTER — Other Ambulatory Visit: Payer: Self-pay

## 2018-07-04 VITALS — BP 116/76 | HR 80 | Resp 18 | Ht 62.0 in | Wt 200.5 lb

## 2018-07-04 DIAGNOSIS — G43009 Migraine without aura, not intractable, without status migrainosus: Secondary | ICD-10-CM | POA: Diagnosis not present

## 2018-07-04 DIAGNOSIS — Z5181 Encounter for therapeutic drug level monitoring: Secondary | ICD-10-CM
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
## 2018-07-04 NOTE — Patient Instructions (Addendum)
Your Plan:  Continue AJovy and nortriptyline Continue Relpax Drug screen today  Thank you for coming to see [place] at [facility]. I hope we have been able to provide you high quality care today.  You may receive a patient satisfaction survey over the next few weeks. We would appreciate your feedback and comments so that we may continue to improve ourselves and the health of our patients.

## 2018-07-05 NOTE — Progress Notes (Signed)
I have reviewed and agreed above plan. 

## 2018-07-09 ENCOUNTER — Encounter: Payer: Self-pay | Admitting: Adult Health

## 2018-07-10 DIAGNOSIS — Z0289 Encounter for other administrative examinations: Secondary | ICD-10-CM

## 2018-07-11 ENCOUNTER — Telehealth: Payer: Self-pay | Admitting: *Deleted

## 2018-07-11 NOTE — Telephone Encounter (Signed)
R/c form from [employer] pt form on [employer].

## 2018-07-11 NOTE — Telephone Encounter (Signed)
[employer] on [place] for review, completion, signature.

## 2018-07-12 NOTE — Telephone Encounter (Signed)
[employer] completed, signed by NP. Sent to medical records for processing.

## 2018-07-12 NOTE — Telephone Encounter (Signed)
I faxed pt [employer] form to [phone]

## 2018-07-17 ENCOUNTER — Telehealth: Payer: Self-pay | Admitting: *Deleted

## 2018-07-17 LAB — OXYCODONES,MS,WB/SP RFX
OXYCOCONE: NEGATIVE ng/mL
Oxycodones Confirmation: NEGATIVE
Oxymorphone: NEGATIVE ng/mL

## 2018-07-17 LAB — DRUG SCREEN 10 W/CONF, SERUM
AMPHETAMINES, IA: NEGATIVE ng/mL
BENZODIAZEPINES, IA: NEGATIVE ng/mL
Barbiturates, IA: NEGATIVE ug/mL
Cocaine & Metabolite, IA: NEGATIVE ng/mL
Methadone, IA: NEGATIVE ng/mL
OPIATES, IA: POSITIVE ng/mL — AB
OXYCODONES, IA: NEGATIVE ng/mL
PROPOXYPHENE, IA: NEGATIVE ng/mL
Phencyclidine, IA: NEGATIVE ng/mL
THC(Marijuana) Metabolite, IA: NEGATIVE ng/mL

## 2018-07-17 LAB — OPIATES,MS,WB/SP RFX
6-Acetylmorphine: NEGATIVE
CODEINE: NEGATIVE ng/mL
Dihydrocodeine: NEGATIVE ng/mL
HYDROCODONE: 8.9 ng/mL
Hydromorphone: NEGATIVE ng/mL
MORPHINE: NEGATIVE ng/mL
OPIATE CONFIRMATION: POSITIVE

## 2018-07-17 NOTE — Telephone Encounter (Signed)
Spoke with patient and informed her that her drug screen showed opiates which was expected. She is on hydrocodone as needed. She stated she had told the NP she took a hydrocodone the night before her appointment. She verbalized understanding, appreciation of call.

## 2018-07-27 ENCOUNTER — Other Ambulatory Visit: Payer: Self-pay | Admitting: Rheumatology

## 2018-07-27 NOTE — Telephone Encounter (Signed)
Last Visit: 05/07/18 Next Visit: 11/07/18  Okay to refill per Dr. Deveshwar 

## 2018-07-30 ENCOUNTER — Other Ambulatory Visit: Payer: Self-pay

## 2018-07-30 MED ORDER — HYDROCODONE-ACETAMINOPHEN 5-325 MG PO TABS: 1.0000 | ORAL_TABLET | Freq: Four times a day (QID) | ORAL | 0 refills | Status: DC | PRN

## 2018-07-30 NOTE — Telephone Encounter (Signed)
Rx registry checked. Last fill date is 06/29/18 for #10. Next OV is 01/07/19.

## 2018-08-12 ENCOUNTER — Other Ambulatory Visit: Payer: Self-pay | Admitting: Neurology

## 2018-08-29 ENCOUNTER — Other Ambulatory Visit: Payer: Self-pay | Admitting: Rheumatology

## 2018-08-29 ENCOUNTER — Other Ambulatory Visit: Payer: Self-pay

## 2018-08-29 NOTE — Telephone Encounter (Signed)
Last Visit: 05/07/18 Next Visit: 11/07/18  Okay to refill per Dr. Corliss Skains

## 2018-09-03 ENCOUNTER — Encounter: Payer: Self-pay | Admitting: Adult Health

## 2018-09-03 MED ORDER — HYDROCODONE-ACETAMINOPHEN 5-325 MG PO TABS: 1.0000 | ORAL_TABLET | Freq: Four times a day (QID) | ORAL | 0 refills | Status: DC | PRN

## 2018-09-06 ENCOUNTER — Telehealth: Payer: Self-pay | Admitting: *Deleted

## 2018-09-06 MED ORDER — NORTRIPTYLINE HCL 10 MG PO CAPS: 40.0000 mg | ORAL_CAPSULE | Freq: Every day | ORAL | 1 refills | Status: DC

## 2018-09-06 NOTE — Telephone Encounter (Signed)
Received fax request to refill nortriptyline. Refilled per e scribe x 6 months.

## 2018-10-01 ENCOUNTER — Other Ambulatory Visit: Payer: Self-pay | Admitting: Adult Health

## 2018-10-01 MED ORDER — HYDROCODONE-ACETAMINOPHEN 5-325 MG PO TABS: 1.0000 | ORAL_TABLET | Freq: Four times a day (QID) | ORAL | 0 refills | Status: DC | PRN

## 2018-10-02 NOTE — Telephone Encounter (Signed)
Handle this request directly, please. Not my patient.

## 2018-10-30 ENCOUNTER — Other Ambulatory Visit: Payer: Self-pay

## 2018-10-30 MED ORDER — HYDROCODONE-ACETAMINOPHEN 5-325 MG PO TABS: 1.0000 | ORAL_TABLET | Freq: Four times a day (QID) | ORAL | 0 refills | Status: DC | PRN

## 2018-11-07 ENCOUNTER — Ambulatory Visit: Payer: BLUE CROSS/BLUE SHIELD | Admitting: Physician Assistant

## 2018-11-29 ENCOUNTER — Other Ambulatory Visit: Payer: Self-pay | Admitting: Adult Health

## 2018-11-29 MED ORDER — HYDROCODONE-ACETAMINOPHEN 5-325 MG PO TABS: 1.0000 | ORAL_TABLET | Freq: Four times a day (QID) | ORAL | 0 refills | Status: DC | PRN

## 2018-12-03 ENCOUNTER — Telehealth: Payer: Self-pay | Admitting: Neurology

## 2018-12-03 ENCOUNTER — Ambulatory Visit: Payer: BLUE CROSS/BLUE SHIELD | Admitting: Neurology

## 2018-12-03 ENCOUNTER — Encounter: Payer: Self-pay | Admitting: Neurology

## 2018-12-03 VITALS — BP 173/96 | HR 89 | Ht 62.0 in | Wt 202.0 lb

## 2018-12-03 DIAGNOSIS — G43101 Migraine with aura, not intractable, with status migrainosus: Secondary | ICD-10-CM

## 2018-12-03 MED ORDER — NORTRIPTYLINE HCL 25 MG PO CAPS: 50.0000 mg | ORAL_CAPSULE | Freq: Every day | ORAL | 4 refills | Status: DC

## 2018-12-03 NOTE — Telephone Encounter (Signed)
Patient will be sending FMLA paperwork to complete.

## 2018-12-03 NOTE — Telephone Encounter (Signed)
We can write 6 days off a month for her migraine, it is OK to have two consecutive days off during her severe migraine.
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
## 2018-12-04 DIAGNOSIS — Z0289 Encounter for other administrative examinations: Secondary | ICD-10-CM

## 2018-12-05 NOTE — Telephone Encounter (Signed)
FMLA ppw received, completed and returned to medical records.

## 2018-12-06 ENCOUNTER — Telehealth: Payer: Self-pay | Admitting: *Deleted

## 2018-12-06 NOTE — Telephone Encounter (Signed)
Pt absence return faxed on 12/06/18 to [phone]

## 2018-12-27 ENCOUNTER — Other Ambulatory Visit: Payer: Self-pay

## 2018-12-27 ENCOUNTER — Other Ambulatory Visit: Payer: Self-pay | Admitting: Adult Health

## 2018-12-27 NOTE — Telephone Encounter (Signed)
Per last note, holding on ajovy pending other treatment.

## 2018-12-28 MED ORDER — HYDROCODONE-ACETAMINOPHEN 5-325 MG PO TABS: 1.0000 | ORAL_TABLET | Freq: Four times a day (QID) | ORAL | 0 refills | Status: DC | PRN

## 2018-12-31 ENCOUNTER — Other Ambulatory Visit: Payer: Self-pay | Admitting: Adult Health

## 2019-01-01 ENCOUNTER — Other Ambulatory Visit: Payer: Self-pay | Admitting: Rheumatology

## 2019-01-02 ENCOUNTER — Other Ambulatory Visit: Payer: Self-pay | Admitting: Adult Health

## 2019-01-02 NOTE — Telephone Encounter (Signed)
Patient called in and stated she can take the ajovy with her other meds , and she is ok to ajovy and the refill can be sent in.

## 2019-01-07 ENCOUNTER — Ambulatory Visit: Payer: BLUE CROSS/BLUE SHIELD | Admitting: Neurology

## 2019-01-08 ENCOUNTER — Telehealth: Payer: Self-pay | Admitting: *Deleted

## 2019-01-08 NOTE — Telephone Encounter (Signed)
PA for Ajovy approved by [facility].  XBMW#[id number].  Covermymeds key: [id number].  Valid through 01/07/2020.

## 2019-01-29 ENCOUNTER — Other Ambulatory Visit: Payer: Self-pay

## 2019-01-30 ENCOUNTER — Other Ambulatory Visit: Payer: Self-pay

## 2019-01-30 MED ORDER — HYDROCODONE-ACETAMINOPHEN 5-325 MG PO TABS: 1.0000 | ORAL_TABLET | Freq: Four times a day (QID) | ORAL | 0 refills | Status: DC | PRN

## 2019-01-30 NOTE — Telephone Encounter (Deleted)
[place] Database Verified LR: 01/02/2019 Qty: 10 Pending appointment: 06/03/2019(Sarah Slack, NP)  

## 2019-01-30 NOTE — Telephone Encounter (Signed)
Drug Registry checked. Last fill 01-02-19 # 10.

## 2019-01-30 NOTE — Addendum Note (Signed)
Addended by: Guy Begin on: 01/30/2019 05:04 PM   Modules accepted: Orders

## 2019-01-30 NOTE — Telephone Encounter (Signed)
 Database Verified LR: 01/02/2019 Qty: 10 Pending appointment: 06/03/2019(Sarah Christia Reading, NP)

## 2019-02-05 MED ORDER — HYDROCODONE-ACETAMINOPHEN 5-325 MG PO TABS: 1.0000 | ORAL_TABLET | Freq: Four times a day (QID) | ORAL | 0 refills | Status: DC | PRN

## 2019-02-05 NOTE — Telephone Encounter (Signed)
Called and spoke to pharmacist [facility] [place], pt did not pick up 01-30-19 Dr. Frances Furbish (WID when Dr Terrace Arabia not in office) prescription, they will cancell/disregard.  There is another one from today from Dr. Terrace Arabia will fill this one.

## 2019-02-24 ENCOUNTER — Other Ambulatory Visit: Payer: Self-pay | Admitting: Neurology

## 2019-03-04 ENCOUNTER — Other Ambulatory Visit: Payer: Self-pay

## 2019-03-06 MED ORDER — HYDROCODONE-ACETAMINOPHEN 5-325 MG PO TABS: 1.0000 | ORAL_TABLET | Freq: Four times a day (QID) | ORAL | 0 refills | Status: DC | PRN

## 2019-04-05 ENCOUNTER — Other Ambulatory Visit: Payer: Self-pay

## 2019-04-08 MED ORDER — HYDROCODONE-ACETAMINOPHEN 5-325 MG PO TABS: 1.0000 | ORAL_TABLET | Freq: Four times a day (QID) | ORAL | 0 refills | Status: DC | PRN

## 2019-04-16 ENCOUNTER — Other Ambulatory Visit: Payer: Self-pay | Admitting: Neurology

## 2019-05-07 ENCOUNTER — Other Ambulatory Visit: Payer: Self-pay

## 2019-05-07 MED ORDER — HYDROCODONE-ACETAMINOPHEN 5-325 MG PO TABS: 1.0000 | ORAL_TABLET | Freq: Four times a day (QID) | ORAL | 0 refills | Status: DC | PRN

## 2019-05-07 NOTE — Telephone Encounter (Signed)
[place] Database Verified LR: 04-08-2019 Qty: 10 Pending appointment: 06-03-2019 Butler Denmark, NP)

## 2019-06-03 ENCOUNTER — Ambulatory Visit: Payer: BLUE CROSS/BLUE SHIELD | Admitting: Neurology

## 2019-06-06 ENCOUNTER — Encounter: Payer: Self-pay | Admitting: Neurology

## 2019-06-06 ENCOUNTER — Ambulatory Visit: Payer: BC Managed Care – PPO | Admitting: Neurology

## 2019-06-06 ENCOUNTER — Other Ambulatory Visit: Payer: Self-pay

## 2019-06-06 VITALS — BP 145/95 | HR 98 | Temp 98.2°F | Ht 62.0 in | Wt 219.5 lb

## 2019-06-06 DIAGNOSIS — G43101 Migraine with aura, not intractable, with status migrainosus: Secondary | ICD-10-CM | POA: Diagnosis not present

## 2019-06-06 DIAGNOSIS — Z79899 Other long term (current) drug therapy: Secondary | ICD-10-CM | POA: Insufficient documentation

## 2019-06-06 MED ORDER — NORTRIPTYLINE HCL 25 MG PO CAPS: 50.0000 mg | ORAL_CAPSULE | Freq: Every day | ORAL | 4 refills | Status: DC

## 2019-06-06 MED ORDER — HYDROCODONE-ACETAMINOPHEN 5-325 MG PO TABS: 1.0000 | ORAL_TABLET | Freq: Four times a day (QID) | ORAL | 0 refills | Status: DC | PRN

## 2019-06-06 MED ORDER — PROPRANOLOL HCL ER 80 MG PO CP24: 80.0000 mg | ORAL_CAPSULE | Freq: Every day | ORAL | 4 refills | Status: DC

## 2019-06-06 MED ORDER — ELETRIPTAN HYDROBROMIDE 40 MG PO TABS: ORAL_TABLET | ORAL | 11 refills | Status: DC

## 2019-06-06 MED ORDER — AJOVY 225 MG/1.5ML ~~LOC~~ SOSY: 225.0000 mg | PREFILLED_SYRINGE | SUBCUTANEOUS | 11 refills | Status: DC

## 2019-06-06 MED ORDER — CYCLOBENZAPRINE HCL 10 MG PO TABS: 10.0000 mg | ORAL_TABLET | ORAL | 6 refills | Status: DC | PRN
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
## 2019-06-18 ENCOUNTER — Other Ambulatory Visit: Payer: Self-pay | Admitting: *Deleted

## 2019-06-18 MED ORDER — CYCLOBENZAPRINE HCL 10 MG PO TABS: 10.0000 mg | ORAL_TABLET | Freq: Every day | ORAL | 6 refills | Status: DC | PRN

## 2019-06-28 ENCOUNTER — Encounter: Payer: Self-pay | Admitting: Family Medicine

## 2019-07-08 ENCOUNTER — Other Ambulatory Visit: Payer: Self-pay

## 2019-07-08 MED ORDER — HYDROCODONE-ACETAMINOPHEN 5-325 MG PO TABS: 1.0000 | ORAL_TABLET | Freq: Four times a day (QID) | ORAL | 0 refills | Status: DC | PRN

## 2019-07-08 NOTE — Telephone Encounter (Signed)
[facility] Database Verified LR: 06-06-2019 Qty: 10 Pending appointment: 12-17-2019 Butler Denmark, NP)

## 2019-08-08 ENCOUNTER — Other Ambulatory Visit: Payer: Self-pay

## 2019-08-08 MED ORDER — HYDROCODONE-ACETAMINOPHEN 5-325 MG PO TABS: 1.0000 | ORAL_TABLET | Freq: Four times a day (QID) | ORAL | 0 refills | Status: DC | PRN

## 2019-09-09 ENCOUNTER — Other Ambulatory Visit: Payer: Self-pay

## 2019-09-10 MED ORDER — HYDROCODONE-ACETAMINOPHEN 5-325 MG PO TABS: 1.0000 | ORAL_TABLET | Freq: Four times a day (QID) | ORAL | 0 refills | Status: DC | PRN

## 2019-10-07 ENCOUNTER — Other Ambulatory Visit: Payer: Self-pay

## 2019-10-07 MED ORDER — HYDROCODONE-ACETAMINOPHEN 5-325 MG PO TABS: 1.0000 | ORAL_TABLET | Freq: Four times a day (QID) | ORAL | 0 refills | Status: DC | PRN

## 2019-11-01 ENCOUNTER — Other Ambulatory Visit: Payer: Self-pay

## 2019-11-01 MED ORDER — HYDROCODONE-ACETAMINOPHEN 5-325 MG PO TABS: 1.0000 | ORAL_TABLET | Freq: Four times a day (QID) | ORAL | 0 refills | Status: DC | PRN

## 2019-11-29 ENCOUNTER — Other Ambulatory Visit: Payer: Self-pay | Admitting: Neurology

## 2019-12-08 ENCOUNTER — Other Ambulatory Visit: Payer: Self-pay

## 2019-12-09 MED ORDER — HYDROCODONE-ACETAMINOPHEN 5-325 MG PO TABS: 1.0000 | ORAL_TABLET | Freq: Four times a day (QID) | ORAL | 0 refills | Status: DC | PRN

## 2019-12-10 ENCOUNTER — Telehealth: Payer: Self-pay | Admitting: *Deleted

## 2019-12-10 NOTE — Telephone Encounter (Signed)
PA for Ajovy completed verbally with [facility] [phone]). Pt ZV#[id number]. PP#[id number]. Approved through 12/09/2020.

## 2019-12-17 ENCOUNTER — Ambulatory Visit (INDEPENDENT_AMBULATORY_CARE_PROVIDER_SITE_OTHER): Payer: BC Managed Care – PPO | Admitting: Neurology

## 2019-12-17 ENCOUNTER — Telehealth: Payer: Self-pay

## 2019-12-17 ENCOUNTER — Other Ambulatory Visit: Payer: Self-pay

## 2019-12-17 ENCOUNTER — Encounter: Payer: Self-pay | Admitting: Neurology

## 2019-12-17 VITALS — BP 146/106 | HR 96 | Temp 97.4°F | Ht 62.0 in | Wt 213.0 lb

## 2019-12-17 DIAGNOSIS — G43101 Migraine with aura, not intractable, with status migrainosus: Secondary | ICD-10-CM | POA: Diagnosis not present

## 2019-12-17 MED ORDER — CYCLOBENZAPRINE HCL 10 MG PO TABS: 10.0000 mg | ORAL_TABLET | Freq: Every day | ORAL | 6 refills | Status: DC | PRN

## 2019-12-17 MED ORDER — UBRELVY 100 MG PO TABS: 100.0000 mg | ORAL_TABLET | ORAL | 11 refills | Status: DC | PRN

## 2019-12-17 NOTE — Patient Instructions (Signed)
It was great to meet you today! Continue current medications, try Bernita Raisin as needed for headache, take one with headache, may repeat x once 2 hours later if needed, max is 200 mg daily  See you back in 6 months
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
## 2019-12-17 NOTE — Telephone Encounter (Signed)
Initiated a PA on CMM for Ubrelvy 100mg .  Awaiting response.   

## 2019-12-18 ENCOUNTER — Encounter: Payer: Self-pay | Admitting: Neurology

## 2019-12-18 DIAGNOSIS — Z0289 Encounter for other administrative examinations: Secondary | ICD-10-CM

## 2019-12-19 ENCOUNTER — Telehealth: Payer: Self-pay | Admitting: *Deleted

## 2019-12-19 NOTE — Telephone Encounter (Signed)
[employer] completed, on NP's desk for review, completion, signature.

## 2019-12-19 NOTE — Telephone Encounter (Signed)
[employer] FMLA papers completed, signed, sent to medical records for processing. Patient allowed #6 episodes per month, duration 1 day per episode.

## 2019-12-19 NOTE — Telephone Encounter (Signed)
Paperwork completed, as as done prior by Dr. Terrace Arabia.

## 2019-12-19 NOTE — Telephone Encounter (Signed)
CaseId:[id number];Status:Approved;Review Type:Prior Auth;Coverage Start Date:11/17/2019;Coverage End Date:12/16/2020;    Approved on CMM, sent fax to pt pharmacy.

## 2019-12-25 NOTE — Progress Notes (Signed)
I have reviewed and agreed above plan. 

## 2019-12-30 ENCOUNTER — Encounter: Payer: Self-pay | Admitting: Neurology

## 2020-01-07 ENCOUNTER — Encounter: Payer: Self-pay | Admitting: Neurology

## 2020-01-07 ENCOUNTER — Other Ambulatory Visit: Payer: Self-pay

## 2020-01-08 ENCOUNTER — Telehealth: Payer: Self-pay | Admitting: *Deleted

## 2020-01-08 MED ORDER — HYDROCODONE-ACETAMINOPHEN 5-325 MG PO TABS: 1.0000 | ORAL_TABLET | Freq: Four times a day (QID) | ORAL | 0 refills | Status: DC | PRN

## 2020-01-08 NOTE — Telephone Encounter (Signed)
I spoke to the patient. Her most recent FMLA ppw was written for 6 migraine days per month, lasting 1 day each.  She needs it re-written like her previous ppw from several years ago. Her other FMLA stated 3 migraine days per month, lasting 1-2 days each. She said this just came to her attention as being a problem. She has not required two consecutive days off in a while and her employer gave her a warning this week.  She is going to email a another copy of the FMLA through [place].  This will just be an update to her recent forms.

## 2020-01-08 NOTE — Telephone Encounter (Signed)
Pt returned call. Please call back when available. 

## 2020-01-08 NOTE — Telephone Encounter (Signed)
The patient sent a mychart message concerning her FMLA ppw. I have emailed her back and left a message on her voicemail asking for a call back.

## 2020-01-09 NOTE — Telephone Encounter (Signed)
The ppw has been updated and returned to medical records. We will fax the ppw directly to her employer. A copy has been placed up front for the patient.

## 2020-01-13 ENCOUNTER — Telehealth: Payer: Self-pay | Admitting: *Deleted

## 2020-01-13 NOTE — Telephone Encounter (Signed)
Pt certification form faxed to [phone]

## 2020-02-05 ENCOUNTER — Other Ambulatory Visit: Payer: Self-pay

## 2020-02-05 MED ORDER — HYDROCODONE-ACETAMINOPHEN 5-325 MG PO TABS: 1.0000 | ORAL_TABLET | Freq: Four times a day (QID) | ORAL | 0 refills | Status: DC | PRN

## 2020-02-05 NOTE — Telephone Encounter (Signed)
Pt is due for a refill on norco. Pt is up to date on her appts. [place] Controlled Substance Registry was checked and verified. Pt has been filling norco appropriately.

## 2020-03-09 ENCOUNTER — Other Ambulatory Visit: Payer: Self-pay

## 2020-03-09 MED ORDER — HYDROCODONE-ACETAMINOPHEN 5-325 MG PO TABS: 1.0000 | ORAL_TABLET | Freq: Four times a day (QID) | ORAL | 0 refills | Status: DC | PRN

## 2020-04-07 ENCOUNTER — Other Ambulatory Visit: Payer: Self-pay

## 2020-04-07 NOTE — Telephone Encounter (Signed)
Pt is up to date on her appts and is due for a refill on norco. [place] Controlled Substance Registry checked and is appropriate.

## 2020-04-08 MED ORDER — HYDROCODONE-ACETAMINOPHEN 5-325 MG PO TABS: 1.0000 | ORAL_TABLET | Freq: Four times a day (QID) | ORAL | 0 refills | Status: DC | PRN

## 2020-05-08 ENCOUNTER — Other Ambulatory Visit: Payer: Self-pay

## 2020-05-11 ENCOUNTER — Encounter: Payer: Self-pay | Admitting: Neurology

## 2020-05-12 ENCOUNTER — Other Ambulatory Visit: Payer: Self-pay | Admitting: *Deleted

## 2020-05-12 MED ORDER — HYDROCODONE-ACETAMINOPHEN 5-325 MG PO TABS: 1.0000 | ORAL_TABLET | Freq: Four times a day (QID) | ORAL | 0 refills | Status: DC | PRN

## 2020-06-10 ENCOUNTER — Other Ambulatory Visit: Payer: Self-pay

## 2020-06-10 MED ORDER — HYDROCODONE-ACETAMINOPHEN 5-325 MG PO TABS: 1.0000 | ORAL_TABLET | Freq: Four times a day (QID) | ORAL | 0 refills | Status: DC | PRN

## 2020-06-17 ENCOUNTER — Ambulatory Visit (INDEPENDENT_AMBULATORY_CARE_PROVIDER_SITE_OTHER): Payer: BC Managed Care – PPO | Admitting: Neurology

## 2020-06-17 ENCOUNTER — Other Ambulatory Visit: Payer: Self-pay

## 2020-06-17 ENCOUNTER — Encounter: Payer: Self-pay | Admitting: Neurology

## 2020-06-17 VITALS — BP 149/98 | HR 96 | Ht 62.0 in | Wt 211.0 lb

## 2020-06-17 DIAGNOSIS — G43101 Migraine with aura, not intractable, with status migrainosus: Secondary | ICD-10-CM | POA: Diagnosis not present

## 2020-06-17 MED ORDER — ELETRIPTAN HYDROBROMIDE 40 MG PO TABS: ORAL_TABLET | ORAL | 11 refills | Status: DC

## 2020-06-17 MED ORDER — AJOVY 225 MG/1.5ML ~~LOC~~ SOSY: 225.0000 mg | PREFILLED_SYRINGE | SUBCUTANEOUS | 11 refills | Status: DC

## 2020-06-17 MED ORDER — PROPRANOLOL HCL ER 80 MG PO CP24: 80.0000 mg | ORAL_CAPSULE | Freq: Every day | ORAL | 4 refills | Status: DC

## 2020-06-17 MED ORDER — NORTRIPTYLINE HCL 25 MG PO CAPS: 50.0000 mg | ORAL_CAPSULE | Freq: Every day | ORAL | 4 refills | Status: DC

## 2020-06-17 NOTE — Patient Instructions (Signed)
Great to see you today! °Continue current medications  °See you back in 6 months  °
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
## 2020-07-09 ENCOUNTER — Other Ambulatory Visit: Payer: Self-pay

## 2020-07-10 MED ORDER — HYDROCODONE-ACETAMINOPHEN 5-325 MG PO TABS
1.0000 | ORAL_TABLET | Freq: Four times a day (QID) | ORAL | 0 refills | Status: DC | PRN
Start: 2020-07-10 — End: 2020-08-09

## 2020-07-18 ENCOUNTER — Other Ambulatory Visit: Payer: Self-pay | Admitting: Neurology

## 2020-08-09 ENCOUNTER — Other Ambulatory Visit: Payer: Self-pay

## 2020-08-10 MED ORDER — HYDROCODONE-ACETAMINOPHEN 5-325 MG PO TABS
1.0000 | ORAL_TABLET | Freq: Four times a day (QID) | ORAL | 0 refills | Status: DC | PRN
Start: 2020-08-10 — End: 2020-09-09

## 2020-09-02 ENCOUNTER — Other Ambulatory Visit: Payer: Self-pay | Admitting: Neurology

## 2020-09-09 ENCOUNTER — Other Ambulatory Visit: Payer: Self-pay

## 2020-09-09 MED ORDER — HYDROCODONE-ACETAMINOPHEN 5-325 MG PO TABS
1.0000 | ORAL_TABLET | Freq: Four times a day (QID) | ORAL | 0 refills | Status: DC | PRN
Start: 2020-09-09 — End: 2020-10-07

## 2020-09-14 NOTE — Progress Notes (Signed)
I have reviewed and agreed above plan. 

## 2020-09-28 ENCOUNTER — Other Ambulatory Visit: Payer: Self-pay | Admitting: Neurology

## 2020-10-07 ENCOUNTER — Other Ambulatory Visit: Payer: Self-pay

## 2020-10-07 MED ORDER — HYDROCODONE-ACETAMINOPHEN 5-325 MG PO TABS
1.0000 | ORAL_TABLET | Freq: Four times a day (QID) | ORAL | 0 refills | Status: DC | PRN
Start: 2020-10-07 — End: 2021-11-17

## 2020-11-06 ENCOUNTER — Other Ambulatory Visit: Payer: Self-pay

## 2020-11-10 ENCOUNTER — Telehealth: Payer: Self-pay | Admitting: *Deleted

## 2020-11-10 ENCOUNTER — Encounter: Payer: Self-pay | Admitting: Neurology

## 2020-11-10 NOTE — Telephone Encounter (Signed)
Our office received a refill request for hydrocodone-apap 5-325mg  for this patient. She is typically allowed #10 per 30 days for her migraines. Her last rx for #10 by Dr. Terrace Arabia was filled on 10/07/20.  However, in checking the [place] [facility], it was discovered that she just filled a prescription for hydrocodone-apap 5-325mg , #30, by Dr. Tracey Harries on 11/03/2020.  Her request was denied by our office. I have left her a message for a call back to discuss this issue. Dr. Terrace Arabia has been notified.
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
## 2020-12-16 ENCOUNTER — Ambulatory Visit (INDEPENDENT_AMBULATORY_CARE_PROVIDER_SITE_OTHER): Payer: BC Managed Care – PPO | Admitting: Neurology

## 2020-12-16 ENCOUNTER — Encounter: Payer: Self-pay | Admitting: Neurology

## 2020-12-16 VITALS — BP 151/102 | HR 96 | Ht 62.0 in | Wt 211.0 lb

## 2020-12-16 DIAGNOSIS — G43101 Migraine with aura, not intractable, with status migrainosus: Secondary | ICD-10-CM | POA: Diagnosis not present

## 2020-12-16 MED ORDER — AJOVY 225 MG/1.5ML ~~LOC~~ SOSY
225.0000 mg | PREFILLED_SYRINGE | SUBCUTANEOUS | 11 refills | Status: DC
Start: 2020-12-16 — End: 2021-12-07

## 2020-12-16 MED ORDER — ELETRIPTAN HYDROBROMIDE 40 MG PO TABS: ORAL_TABLET | ORAL | 11 refills | Status: DC

## 2020-12-16 MED ORDER — NORTRIPTYLINE HCL 25 MG PO CAPS: 50.0000 mg | ORAL_CAPSULE | Freq: Every day | ORAL | 4 refills | Status: DC

## 2020-12-16 MED ORDER — ONDANSETRON HCL 4 MG PO TABS: ORAL_TABLET | ORAL | 11 refills | Status: DC

## 2020-12-16 MED ORDER — PROPRANOLOL HCL ER 80 MG PO CP24: 80.0000 mg | ORAL_CAPSULE | Freq: Every day | ORAL | 4 refills | Status: DC

## 2020-12-16 MED ORDER — UBRELVY 100 MG PO TABS: 100.0000 mg | ORAL_TABLET | ORAL | 11 refills | Status: DC | PRN

## 2020-12-16 NOTE — Patient Instructions (Signed)
Continue to recover from the shingles! Continue current medications for migraines  See you back in 1 year

## 2021-01-11 ENCOUNTER — Other Ambulatory Visit: Payer: Self-pay | Admitting: Neurology

## 2021-02-10 ENCOUNTER — Other Ambulatory Visit: Payer: Self-pay | Admitting: Neurology

## 2021-02-11 ENCOUNTER — Other Ambulatory Visit: Payer: Self-pay | Admitting: Neurology

## 2021-03-10 ENCOUNTER — Encounter: Payer: Self-pay | Admitting: *Deleted

## 2021-03-10 ENCOUNTER — Encounter: Payer: Self-pay | Admitting: Neurology

## 2021-03-16 ENCOUNTER — Telehealth: Payer: Self-pay | Admitting: *Deleted

## 2021-03-16 NOTE — Telephone Encounter (Signed)
Received FMLA form along with Return to Work form

## 2021-03-17 NOTE — Telephone Encounter (Signed)
FMLA and return to work form completed for review and signature.

## 2021-03-18 ENCOUNTER — Telehealth: Payer: Self-pay | Admitting: *Deleted

## 2021-03-18 NOTE — Telephone Encounter (Signed)
To medical records.

## 2021-03-18 NOTE — Telephone Encounter (Signed)
Pt [employer] form faxed on 03/18/21

## 2021-04-05 ENCOUNTER — Encounter: Payer: Self-pay | Admitting: Neurology

## 2021-04-06 NOTE — Telephone Encounter (Signed)
FMLA redone.  To SS/NP for signature.

## 2021-04-07 ENCOUNTER — Telehealth: Payer: Self-pay | Admitting: *Deleted

## 2021-04-07 NOTE — Telephone Encounter (Signed)
I faxed pt fmla paper work on 04/07/21

## 2021-04-07 NOTE — Telephone Encounter (Signed)
Signed to MR.  

## 2021-05-12 ENCOUNTER — Encounter: Payer: Self-pay | Admitting: Neurology

## 2021-05-25 ENCOUNTER — Ambulatory Visit: Payer: BC Managed Care – PPO | Admitting: Neurology

## 2021-09-21 ENCOUNTER — Encounter: Payer: Self-pay | Admitting: Neurology

## 2021-09-23 ENCOUNTER — Other Ambulatory Visit: Payer: Self-pay | Admitting: Obstetrics and Gynecology

## 2021-09-27 ENCOUNTER — Other Ambulatory Visit: Payer: Self-pay | Admitting: Obstetrics and Gynecology

## 2021-10-08 ENCOUNTER — Encounter (HOSPITAL_BASED_OUTPATIENT_CLINIC_OR_DEPARTMENT_OTHER): Payer: Self-pay | Admitting: Obstetrics and Gynecology

## 2021-10-11 ENCOUNTER — Encounter (HOSPITAL_BASED_OUTPATIENT_CLINIC_OR_DEPARTMENT_OTHER): Payer: Self-pay | Admitting: Obstetrics and Gynecology

## 2021-10-11 ENCOUNTER — Other Ambulatory Visit: Payer: Self-pay

## 2021-10-11 ENCOUNTER — Encounter (HOSPITAL_COMMUNITY)
Admission: RE | Admit: 2021-10-11 | Discharge: 2021-10-11 | Disposition: A | Discharge status: Home / Self Care | Payer: BC Managed Care – PPO | Source: Ambulatory Visit | Attending: Obstetrics and Gynecology | Admitting: Obstetrics and Gynecology

## 2021-10-11 DIAGNOSIS — N840 Polyp of corpus uteri: Secondary | ICD-10-CM | POA: Diagnosis not present

## 2021-10-11 DIAGNOSIS — E119 Type 2 diabetes mellitus without complications: Secondary | ICD-10-CM | POA: Diagnosis not present

## 2021-10-11 DIAGNOSIS — M069 Rheumatoid arthritis, unspecified: Secondary | ICD-10-CM | POA: Diagnosis not present

## 2021-10-11 DIAGNOSIS — D649 Anemia, unspecified: Secondary | ICD-10-CM | POA: Diagnosis not present

## 2021-10-11 DIAGNOSIS — R519 Headache, unspecified: Secondary | ICD-10-CM | POA: Diagnosis not present

## 2021-10-11 DIAGNOSIS — G4730 Sleep apnea, unspecified: Secondary | ICD-10-CM | POA: Diagnosis not present

## 2021-10-11 DIAGNOSIS — Z01818 Encounter for other preprocedural examination: Secondary | ICD-10-CM | POA: Insufficient documentation

## 2021-10-11 DIAGNOSIS — Z6839 Body mass index (BMI) 39.0-39.9, adult: Secondary | ICD-10-CM | POA: Diagnosis not present

## 2021-10-11 DIAGNOSIS — I10 Essential (primary) hypertension: Secondary | ICD-10-CM | POA: Diagnosis not present

## 2021-10-11 DIAGNOSIS — N939 Abnormal uterine and vaginal bleeding, unspecified: Secondary | ICD-10-CM | POA: Diagnosis not present

## 2021-10-11 DIAGNOSIS — K219 Gastro-esophageal reflux disease without esophagitis: Secondary | ICD-10-CM | POA: Diagnosis not present

## 2021-10-11 LAB — BASIC METABOLIC PANEL
Anion gap: 8 (ref 5–15)
BUN: 11 mg/dL (ref 6–20)
CO2: 20 mmol/L — ABNORMAL LOW (ref 22–32)
Calcium: 8.9 mg/dL (ref 8.9–10.3)
Chloride: 109 mmol/L (ref 98–111)
Creatinine, Ser: 0.72 mg/dL (ref 0.44–1.00)
GFR, Estimated: >60 mL/min (ref >60)
Glucose, Bld: 181 mg/dL — ABNORMAL HIGH (ref 70–99)
Potassium: 3.9 mmol/L (ref 3.5–5.1)
Sodium: 137 mmol/L (ref 135–145)

## 2021-10-11 LAB — CBC
HCT: 33.8 % — ABNORMAL LOW (ref 36.0–46.0)
Hemoglobin: 10.6 g/dL — ABNORMAL LOW (ref 12.0–15.0)
MCH: 24.8 pg — ABNORMAL LOW (ref 26.0–34.0)
MCHC: 31.4 g/dL (ref 30.0–36.0)
MCV: 79 fL — ABNORMAL LOW (ref 80.0–100.0)
Platelets: 446 10*3/uL — ABNORMAL HIGH (ref 150–400)
RBC: 4.28 MIL/uL (ref 3.87–5.11)
RDW: 15.3 % (ref 11.5–15.5)
WBC: 10.1 10*3/uL (ref 4.0–10.5)
nRBC: 0 % (ref 0.0–0.2)

## 2021-10-11 NOTE — Progress Notes (Addendum)
Spoke w/ via phone for pre-op interview--- pt Lab needs dos----  urine preg             Lab results------ pt had lab work done today results in epic CBC/ BMP/ T&S/ EKG COVID test -----patient states asymptomatic no test needed Arrive at ------- 1215 NPO after MN NO Solid Food.  Clear liquids from MN until--- 1115 Med rec completed Medications to take morning of surgery ----- lyrica Diabetic medication ----- do not take rybelsus morning of surgery Patient instructed no nail polish to be worn day of surgery Patient instructed to bring photo id and insurance card day of surgery Patient aware to have Driver (ride ) / caregiver  for 24 hours after surgery --- husband, [name] Patient Special Instructions ----- n/a Pre-Op special Istructions -----  pt uses Libre glucose continuous monitor, on left upper arm Patient verbalized understanding of instructions that were given at this phone interview. Patient denies shortness of breath, chest pain, fever, cough at this phone interview.    Anesthesia Review: per pt monitored by pcp for HTN, no medication since approx 2007;  Chronic migraine;  DM2; RA; moderate OSA no cpap (pt refused per note in care everywhere 09-29-2018) Residual nerve pain from severe shingles this year  PCP: Mary Seller NP (lov 10-01-2021 epic) Endocrinologist:  [name] (lov 04-08-2021 epic) Neurologist:  [name] [name] 12-16-2020 epic) Chest x-ray :  11-10-2012 epic EKG :  10-11-2021 epic Echo : no Stress test: no Cardiac Cath : no Activity level: denies sob w/ any activity Sleep Study/ CPAP : Yes/ No Fasting Blood Sugar :   120--150   / Checks Blood Sugar -- times a day:  4 or more Blood Thinner/ Instructions /Last Dose: no ASA / Instructions/ Last Dose :  no

## 2021-10-12 NOTE — Anesthesia Preprocedure Evaluation (Addendum)
Anesthesia Evaluation  Patient identified by MRN, date of birth, ID band Patient awake    Reviewed: Allergy & Precautions, NPO status , Patient's Chart, lab work & pertinent test results  Airway Mallampati: II  TM Distance: >3 FB Neck ROM: Full    Dental no notable dental hx. (+) Teeth Intact, Dental Advisory Given   Pulmonary sleep apnea ,    Pulmonary exam normal breath sounds clear to auscultation       Cardiovascular hypertension, Pt. on medications Normal cardiovascular exam Rhythm:Regular Rate:Normal     Neuro/Psych  Headaches, Anxiety    GI/Hepatic GERD  Medicated and Controlled,  Endo/Other  diabetesMorbid obesity (BMI 39.14)  Renal/GU      Musculoskeletal  (+) Arthritis , Rheumatoid disorders,    Abdominal (+) + obese,   Peds  Hematology  (+) anemia , Lab Results      Component                Value               Date                      WBC                      10.1                10/11/2021                HGB                      10.6 (L)            10/11/2021                HCT                      33.8 (L)            10/11/2021                MCV                      79.0 (L)            10/11/2021                PLT                      446 (H)             10/11/2021              Anesthesia Other Findings All: felodipine, metformin, latex, summatrtan  Reproductive/Obstetrics                            Anesthesia Physical Anesthesia Plan  ASA: 3  Anesthesia Plan: General   Post-op Pain Management: Toradol IV (intra-op), Dilaudid IV and Tylenol PO (pre-op)   Induction: Intravenous  PONV Risk Score and Plan: 4 or greater and Midazolam, Dexamethasone, Ondansetron and Treatment may vary due to age or medical condition  Airway Management Planned: LMA  Additional Equipment: None  Intra-op Plan:   Post-operative Plan:   Informed Consent: I have reviewed the patients  History and Physical, chart, labs and discussed the procedure including the risks, benefits and alternatives for the proposed anesthesia with the patient or  authorized representative who has indicated his/her understanding and acceptance.     Dental advisory given  Plan Discussed with: CRNA and Anesthesiologist  Anesthesia Plan Comments: (GA)       Anesthesia Quick Evaluation

## 2021-10-13 ENCOUNTER — Encounter (HOSPITAL_BASED_OUTPATIENT_CLINIC_OR_DEPARTMENT_OTHER): Payer: Self-pay | Admitting: Obstetrics and Gynecology

## 2021-10-13 ENCOUNTER — Ambulatory Visit (HOSPITAL_BASED_OUTPATIENT_CLINIC_OR_DEPARTMENT_OTHER): Payer: BC Managed Care – PPO | Admitting: Anesthesiology

## 2021-10-13 ENCOUNTER — Ambulatory Visit (HOSPITAL_BASED_OUTPATIENT_CLINIC_OR_DEPARTMENT_OTHER)
Admission: RE | Admit: 2021-10-13 | Discharge: 2021-10-13 | Disposition: A | Discharge status: Home / Self Care | Payer: BC Managed Care – PPO | Source: Ambulatory Visit | Attending: Obstetrics and Gynecology | Admitting: Obstetrics and Gynecology

## 2021-10-13 ENCOUNTER — Encounter (HOSPITAL_BASED_OUTPATIENT_CLINIC_OR_DEPARTMENT_OTHER)
Admission: RE | Disposition: A | Discharge status: Home / Self Care | Payer: Self-pay | Source: Ambulatory Visit | Attending: Obstetrics and Gynecology

## 2021-10-13 DIAGNOSIS — N939 Abnormal uterine and vaginal bleeding, unspecified: Secondary | ICD-10-CM | POA: Diagnosis not present

## 2021-10-13 DIAGNOSIS — E119 Type 2 diabetes mellitus without complications: Secondary | ICD-10-CM | POA: Insufficient documentation

## 2021-10-13 DIAGNOSIS — M069 Rheumatoid arthritis, unspecified: Secondary | ICD-10-CM | POA: Insufficient documentation

## 2021-10-13 DIAGNOSIS — K219 Gastro-esophageal reflux disease without esophagitis: Secondary | ICD-10-CM | POA: Insufficient documentation

## 2021-10-13 DIAGNOSIS — I10 Essential (primary) hypertension: Secondary | ICD-10-CM | POA: Insufficient documentation

## 2021-10-13 DIAGNOSIS — G4730 Sleep apnea, unspecified: Secondary | ICD-10-CM | POA: Insufficient documentation

## 2021-10-13 DIAGNOSIS — N840 Polyp of corpus uteri: Secondary | ICD-10-CM | POA: Insufficient documentation

## 2021-10-13 DIAGNOSIS — Z6839 Body mass index (BMI) 39.0-39.9, adult: Secondary | ICD-10-CM | POA: Insufficient documentation

## 2021-10-13 DIAGNOSIS — D649 Anemia, unspecified: Secondary | ICD-10-CM | POA: Insufficient documentation

## 2021-10-13 DIAGNOSIS — Z01818 Encounter for other preprocedural examination: Secondary | ICD-10-CM

## 2021-10-13 DIAGNOSIS — R519 Headache, unspecified: Secondary | ICD-10-CM | POA: Insufficient documentation

## 2021-10-13 HISTORY — DX: Unspecified osteoarthritis, unspecified site: M19.90

## 2021-10-13 HISTORY — DX: Type 2 diabetes mellitus without complications: E11.9

## 2021-10-13 HISTORY — DX: Obstructive sleep apnea (adult) (pediatric): G47.33

## 2021-10-13 HISTORY — PX: HYSTEROSCOPY: SHX211

## 2021-10-13 HISTORY — DX: Migraine, unspecified, not intractable, without status migrainosus: G43.909

## 2021-10-13 HISTORY — DX: Other seasonal allergic rhinitis: J30.2

## 2021-10-13 HISTORY — DX: Abnormal uterine and vaginal bleeding, unspecified: N93.9

## 2021-10-13 HISTORY — DX: Polyneuropathy, unspecified: G62.9

## 2021-10-13 HISTORY — DX: Rheumatoid arthritis, unspecified: M06.9

## 2021-10-13 HISTORY — DX: Presence of spectacles and contact lenses: Z97.3

## 2021-10-13 HISTORY — DX: Other postherpetic nervous system involvement: B02.29

## 2021-10-13 LAB — TYPE AND SCREEN
ABO/RH(D): A POS
Antibody Screen: NEGATIVE

## 2021-10-13 LAB — POCT PREGNANCY, URINE: Preg Test, Ur: NEGATIVE

## 2021-10-13 LAB — GLUCOSE, CAPILLARY
Glucose-Capillary: 119 mg/dL — ABNORMAL HIGH (ref 70–99)
Glucose-Capillary: 111 mg/dL — ABNORMAL HIGH (ref 70–99)

## 2021-10-13 LAB — ABO/RH: ABO/RH(D): A POS

## 2021-10-13 SURGERY — ABLATION, ENDOMETRIUM, HYSTEROSCOPIC
Anesthesia: General | Site: Uterus

## 2021-10-13 MED ORDER — OXYCODONE HCL 5 MG PO TABS
ORAL_TABLET | ORAL | Status: AC
  Filled 2021-10-13: qty 1

## 2021-10-13 MED ORDER — ONDANSETRON HCL 4 MG/2ML IJ SOLN
INTRAMUSCULAR | Status: DC | PRN
  Administered 2021-10-13: 4 mg via INTRAVENOUS

## 2021-10-13 MED ORDER — ACETAMINOPHEN 160 MG/5ML PO SOLN: 325.0000 mg | ORAL | Status: DC | PRN

## 2021-10-13 MED ORDER — KETOROLAC TROMETHAMINE 30 MG/ML IJ SOLN
INTRAMUSCULAR | Status: DC | PRN
  Administered 2021-10-13: 30 mg via INTRAVENOUS

## 2021-10-13 MED ORDER — LIDOCAINE 2% (20 MG/ML) 5 ML SYRINGE
INTRAMUSCULAR | Status: AC
  Filled 2021-10-13: qty 20

## 2021-10-13 MED ORDER — LIDOCAINE HCL (CARDIAC) PF 100 MG/5ML IV SOSY
PREFILLED_SYRINGE | INTRAVENOUS | Status: DC | PRN
  Administered 2021-10-13: 100 mg via INTRAVENOUS

## 2021-10-13 MED ORDER — PROPOFOL 10 MG/ML IV BOLUS
INTRAVENOUS | Status: AC
  Filled 2021-10-13: qty 20

## 2021-10-13 MED ORDER — ACETAMINOPHEN 325 MG PO TABS: 325.0000 mg | ORAL_TABLET | ORAL | Status: DC | PRN

## 2021-10-13 MED ORDER — MEPERIDINE HCL 25 MG/ML IJ SOLN: 6.2500 mg | INTRAMUSCULAR | Status: DC | PRN

## 2021-10-13 MED ORDER — MIDAZOLAM HCL 2 MG/2ML IJ SOLN
INTRAMUSCULAR | Status: DC | PRN
  Administered 2021-10-13: 2 mg via INTRAVENOUS

## 2021-10-13 MED ORDER — CEFAZOLIN SODIUM-DEXTROSE 2-4 GM/100ML-% IV SOLN: 2.0000 g | INTRAVENOUS | Status: DC

## 2021-10-13 MED ORDER — OXYCODONE HCL 5 MG PO TABS
5.0000 mg | ORAL_TABLET | Freq: Once | ORAL | Status: AC | PRN
  Administered 2021-10-13: 16:00:00 5 mg via ORAL

## 2021-10-13 MED ORDER — DEXAMETHASONE SODIUM PHOSPHATE 10 MG/ML IJ SOLN
INTRAMUSCULAR | Status: AC
  Filled 2021-10-13: qty 2

## 2021-10-13 MED ORDER — FENTANYL CITRATE (PF) 100 MCG/2ML IJ SOLN
INTRAMUSCULAR | Status: DC | PRN
  Administered 2021-10-13 (×2): 50 ug via INTRAVENOUS

## 2021-10-13 MED ORDER — POVIDONE-IODINE 10 % EX SWAB: 2.0000 "application " | Freq: Once | CUTANEOUS | Status: DC

## 2021-10-13 MED ORDER — LACTATED RINGERS IV SOLN: INTRAVENOUS | Status: DC

## 2021-10-13 MED ORDER — PROPOFOL 10 MG/ML IV BOLUS
INTRAVENOUS | Status: DC | PRN
  Administered 2021-10-13: 150 mg via INTRAVENOUS

## 2021-10-13 MED ORDER — MIDAZOLAM HCL 2 MG/2ML IJ SOLN
INTRAMUSCULAR | Status: AC
  Filled 2021-10-13: qty 2

## 2021-10-13 MED ORDER — FENTANYL CITRATE (PF) 100 MCG/2ML IJ SOLN
INTRAMUSCULAR | Status: AC
  Filled 2021-10-13: qty 2

## 2021-10-13 MED ORDER — ONDANSETRON HCL 4 MG/2ML IJ SOLN
INTRAMUSCULAR | Status: AC
  Filled 2021-10-13: qty 4

## 2021-10-13 MED ORDER — LIDOCAINE HCL 2 % IJ SOLN
INTRAMUSCULAR | Status: DC | PRN
  Administered 2021-10-13: 20 mL

## 2021-10-13 MED ORDER — ONDANSETRON HCL 4 MG/2ML IJ SOLN: 4.0000 mg | Freq: Once | INTRAMUSCULAR | Status: DC | PRN

## 2021-10-13 MED ORDER — KETOROLAC TROMETHAMINE 30 MG/ML IJ SOLN
INTRAMUSCULAR | Status: AC
  Filled 2021-10-13: qty 1

## 2021-10-13 MED ORDER — SODIUM CHLORIDE 0.9 % IR SOLN
Status: DC | PRN
  Administered 2021-10-13: 1500 mL
  Administered 2021-10-13: 3000 mL

## 2021-10-13 MED ORDER — DEXMEDETOMIDINE (PRECEDEX) IN NS 20 MCG/5ML (4 MCG/ML) IV SYRINGE
PREFILLED_SYRINGE | INTRAVENOUS | Status: DC | PRN
  Administered 2021-10-13: 12 ug via INTRAVENOUS

## 2021-10-13 MED ORDER — FENTANYL CITRATE (PF) 100 MCG/2ML IJ SOLN
25.0000 ug | INTRAMUSCULAR | Status: DC | PRN
  Administered 2021-10-13 (×3): 50 ug via INTRAVENOUS

## 2021-10-13 MED ORDER — OXYCODONE HCL 5 MG/5ML PO SOLN: 5.0000 mg | Freq: Once | ORAL | Status: AC | PRN

## 2021-10-13 MED ORDER — CEFAZOLIN SODIUM-DEXTROSE 2-4 GM/100ML-% IV SOLN
INTRAVENOUS | Status: AC
  Filled 2021-10-13: qty 100

## 2021-10-13 SURGICAL SUPPLY — 16 items
CATH ROBINSON RED A/P 16FR (CATHETERS) ×1 IMPLANT
CATH SILICONE 16FRX5CC (CATHETERS) ×2 IMPLANT
DILATOR CANAL MILEX (MISCELLANEOUS) IMPLANT
GAUZE 4X4 16PLY ~~LOC~~+RFID DBL (SPONGE) ×3 IMPLANT
GLOVE SURG ENC MOIS LTX SZ6.5 (GLOVE) ×3 IMPLANT
GLOVE SURG UNDER POLY LF SZ7 (GLOVE) ×3 IMPLANT
GOWN STRL REUS W/TWL LRG LVL3 (GOWN DISPOSABLE) ×3 IMPLANT
IV NS IRRIG 3000ML ARTHROMATIC (IV SOLUTION) ×5 IMPLANT
KIT PROCEDURE FLUENT (KITS) IMPLANT
KIT TURNOVER CYSTO (KITS) ×3 IMPLANT
PACK VAGINAL MINOR WOMEN LF (CUSTOM PROCEDURE TRAY) ×3 IMPLANT
PAD OB MATERNITY 4.3X12.25 (PERSONAL CARE ITEMS) ×3 IMPLANT
SEAL ROD LENS SCOPE MYOSURE (ABLATOR) IMPLANT
SET GENESYS HTA PROCERVA (MISCELLANEOUS) ×3 IMPLANT
TOWEL OR 17X26 10 PK STRL BLUE (TOWEL DISPOSABLE) ×3 IMPLANT
UNDERPAD 30X36 HEAVY ABSORB (UNDERPADS AND DIAPERS) ×3 IMPLANT

## 2021-10-13 NOTE — Discharge Instructions (Addendum)
DISCHARGE INSTRUCTIONS: HYSTEROSCOPY / ENDOMETRIAL ABLATION The following instructions have been prepared to help you care for yourself upon your return home.  Personal hygiene:  Use sanitary pads for vaginal drainage, not tampons.  Shower the day after your procedure.  NO tub baths, pools or Jacuzzis for 2-3 weeks.  Wipe front to back after using the bathroom.  Activity and limitations:  Do NOT drive or operate any equipment for 24 hours. The effects of anesthesia are still present and drowsiness may result.  Do NOT rest in bed all day.  Walking is encouraged.  Walk up and down stairs slowly.  You may resume your normal activity in one to two days or as indicated by your physician. Sexual activity: NO intercourse for at least 2 weeks after the procedure, or as indicated by your Doctor.  Diet: Eat a light meal as desired this evening. You may resume your usual diet tomorrow.  Return to Work: You may resume your work activities in one to two days or as indicated by Therapist, sports.  What to expect after your surgery: Expect to have vaginal bleeding/discharge for 2-3 days and spotting for up to 10 days. It is not unusual to have soreness for up to 1-2 weeks. You may have a slight burning sensation when you urinate for the first day. Mild cramps may continue for a couple of days. You may have a regular period in 2-6 weeks.  Call your doctor for any of the following:  Excessive vaginal bleeding or clotting, saturating and changing one pad every hour.  Inability to urinate 6 hours after discharge from hospital.  Pain not relieved by pain medication.  Fever of 100.4 F or greater.  Unusual vaginal discharge or odor.  Post Anesthesia Home Care Instructions  Activity: Get plenty of rest for the remainder of the day. A responsible individual must stay with you for 24 hours following the procedure.  For the next 24 hours, DO NOT: -Drive a car -Advertising copywriter -Drink alcoholic  beverages -Take any medication unless instructed by your physician -Make any legal decisions or sign important papers.  Meals: Start with liquid foods such as gelatin or soup. Progress to regular foods as tolerated. Avoid greasy, spicy, heavy foods. If nausea and/or vomiting occur, drink only clear liquids until the nausea and/or vomiting subsides. Call your physician if vomiting continues.  Special Instructions/Symptoms: Your throat may feel dry or sore from the anesthesia or the breathing tube placed in your throat during surgery. If this causes discomfort, gargle with warm salt water. The discomfort should disappear within 24 hours.  No ibuprofen, Advil, Aleve, Motrin, ketorolac, meloxicam, naproxen, or other NSAIDS until after 9:15 pm today if needed.

## 2021-10-13 NOTE — Op Note (Signed)
Preop Diagnosis: ABNORMAL UTERINE AND VAGINAL BLEEDING   Postop Diagnosis: ABNORMAL UTERINE AND VAGINAL BLEEDING   Procedure: HYSTEROSCOPY WITH HYDROTHERMAL ABLATION   Anesthesia: Choice   Anesthesiologist: No responsible provider has been recorded for the case.   Attending: Jaymes Graff, MD   Assistant: none  Findings: abundant fluffy endometrium  Pathology: endometrial curettings  Fluids:  UOP: 75cc  EBL: minimal   Complications: none  Procedure: The patient was taken to the operating room after risks benefits and alternatives were discussed with patient, the patient verbalized understanding and consent signed and witnessed. The patient was placed under general anesthesia and prepped and draped in normal sterile fashion. A bivalve speculum was placed in the patient's vagina and the anterior lip of the cervix was grasped with a single-tooth tenaculum. The cervix was dilated for passage of the hysteroscope. The uterus sounded to 7.  The hysteroscope was introduced into the uterine cavity with findings as noted above. A curettage was performed and currettings sent to pathology. The hysteroscope was reintroduced and hydrothermal ablation was performed without difficulty. The tenaculum and bivalve speculum were removed and there was good hemostasis at the tenaculum sites. Sponge lap and needle count was correct. The patient tolerated procedure well and was returned to the recovery room in good condition.

## 2021-10-13 NOTE — H&P (Signed)
Mary Hoglund Grubbs-Livengood45 y.o. female. Who presents with heavy and irreg vaginal bleeding for one year.  .  She has uses 1-2 pads/ tampons every hour while menstruating.  She denies any CP or SOB.  Provera makes it better.  Nothing makes it worse.  Positive dysmenorrhea.  Pt has tried provera  without success. It gives her headaches Pertinent Gynecological History: Contraception: Education given regarding options for contraception, including oral contraceptives. Blood transfusions: na Sexually transmitted diseases: na Previous GYN Procedures: D&C Last mammogram: WNL Last pap: normal Date: WNL OB History: G0   Menstrual History: Menarche age: 45 No LMP recorded (lmp unknown). (Menstrual status: Irregular Periods).    Past Medical History:  Diagnosis Date   Abnormal uterine bleeding (AUB)    Allergic rhinitis, seasonal    Alopecia areata totalis    Anemia    Anxiety    Arthritis    Depression    GERD (gastroesophageal reflux disease)    Hypertension    followed by pcp   (10-11-2021  per pt has taken bp med previously approx. 15 yrs ago (2007)   Nonintractable chronic migraine    neurologist--- dr Terrace Arabia   OA (osteoarthritis)    OSA (obstructive sleep apnea)    study result in care everywhere 09-29-2018, moderate OSA , pt refused cpap   Peripheral neuropathy    Polycystic ovary disease    Post herpetic neuralgia    followed by dr Sherryll Burger--- pain clinic,  had severe shingles 2022   RA (rheumatoid arthritis) [facility])    rheumatologist-- dr m. patel;   multiple sites   Type 2 diabetes mellitus [facility])    endocrinologist-- dr m. levy;   (10-11-2021  pt uses Libre, checks 4 or more times daily;  fasting blood sugar-- 120--150)   Wears contact lenses    Past Surgical History:  Procedure Laterality Date   DILATATION & CURETTAGE/HYSTEROSCOPY WITH MYOSURE N/A 08/26/2016   #2  Procedure: DILATATION & CURETTAGE/HYSTEROSCOPY WITH MYOSURE;  Surgeon: Jaymes Graff, MD;  Location: [facility];   Service: Gynecology;  Laterality: N/A;   HYSTEROSCOPY WITH D & C  11/15/2010   @[facility]    LAPAROSCOPIC CHOLECYSTECTOMY  04/24/2003   @[facility]    NASAL SEPTUM SURGERY  2002   WISDOM TOOTH EXTRACTION      Current Facility-Administered Medications:    [START ON 10/14/2021] ceFAZolin (ANCEF) IVPB 2g/100 mL premix, 2 g, Intravenous, On Call to OR, Lord Lancour, 2003, MD   lactated ringers infusion, , Intravenous, Continuous, Ellender, 10/16/2021, MD, Last Rate: 50 mL/hr at 10/13/21 1308, New Bag at 10/13/21 1308   povidone-iodine 10 % swab 2 application, 2 application, Topical, Once, Cheyan Frees, MD Allergies  Allergen Reactions   Felodipine Er Swelling and Other (See Comments)    Reaction:  Lower extremity swelling    Metformin And Related     Diarrhea and vomitting    Prednisone Swelling and Other (See Comments)    Reaction:  Facial swelling ;  pt stated if takes longer than 10 days will start getting Cushing's syndrome symptoms   Propoxyphene Nausea Only   Enbrel [Etanercept] Rash   Imitrex [Sumatriptan Succinate] Palpitations and Other (See Comments)    Reaction:  Chest pain    Latex Rash    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 Result was a rash.right. 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  HENT:  Head: Normocephalic.  Eyes: Pupils are equal, round, and reactive to light.  Neck: Normal range of motion. Neck supple.  Cardiovascular: Regular rhythm.   Respiratory: Effort normal and breath sounds normal.  GI: Soft.  Genitourinary:v/v WNL  normal size AV uterus . NT no adnexal masses B Musculoskeletal: Normal range of motion.  Neurological: She is alert.  Skin: Skin is  warm.  Psychiatric: She has a normal mood and affect.  Results for orders placed or performed during the hospital encounter of 10/13/21 (from the past 72 hour(s))  Pregnancy, urine POC     Status: None   Collection Time: 10/13/21 12:24 PM  Result Value Ref Range   Preg Test, Ur NEGATIVE NEGATIVE    Comment:        THE SENSITIVITY OF THIS METHODOLOGY IS >24 mIU/mL   Glucose, capillary     Status: Abnormal   Collection Time: 10/13/21  1:05 PM  Result Value Ref Range   Glucose-Capillary 119 (H) 70 - 99 mg/dL    Comment: Glucose reference range applies only to samples taken after fasting for at least 8 hours.   10/15/21 width3  Length7 OvariesWNL  Assessment/Plan: DUB EMBX benign polyp   Plan D&C hysteroscopy polypectomy with hydrothermal ablation  Risks are but not limited to bleeding,  burns, infection, scarring of the uterus and perforation.   BP is elevated may need to stop OCPS  Pt with migraines from provera.   Aubreanna Percle A 09/12/2011, 11:41 AM

## 2021-10-13 NOTE — Anesthesia Postprocedure Evaluation (Signed)
Anesthesia Post Note  Patient: [name]  Procedure(s) Performed: HYSTEROSCOPY WITH HYDROTHERMAL ABLATION (Uterus)     Patient location during evaluation: PACU Anesthesia Type: General Level of consciousness: awake and alert Pain management: pain level controlled Vital Signs Assessment: post-procedure vital signs reviewed and stable Respiratory status: spontaneous breathing, nonlabored ventilation, respiratory function stable and patient connected to nasal cannula oxygen Cardiovascular status: blood pressure returned to baseline and stable Postop Assessment: no apparent nausea or vomiting Anesthetic complications: no   No notable events documented.  Last Vitals:  Vitals:   10/13/21 1545 10/13/21 1645  BP: (!) 142/97 134/84  Pulse: 99 93  Resp: (!) 22 15  Temp:  36.6 C  SpO2: 97% 96%    Last Pain:  Vitals:   10/13/21 1645  TempSrc:   PainSc: 5                  Trevor Iha

## 2021-10-13 NOTE — Anesthesia Procedure Notes (Signed)
Procedure Name: LMA Insertion Date/Time: 10/13/2021 2:35 PM Performed by: Earmon Phoenix, CRNA Pre-anesthesia Checklist: Patient identified, Emergency Drugs available, Suction available, Patient being monitored and Timeout performed Patient Re-evaluated:Patient Re-evaluated prior to induction Oxygen Delivery Method: Circle system utilized Preoxygenation: Pre-oxygenation with 100% oxygen Induction Type: IV induction Ventilation: Mask ventilation without difficulty LMA: LMA inserted LMA Size: 4.0 Number of attempts: 1 Placement Confirmation: positive ETCO2, CO2 detector and breath sounds checked- equal and bilateral Tube secured with: Tape Dental Injury: Teeth and Oropharynx as per pre-operative assessment

## 2021-10-13 NOTE — Transfer of Care (Signed)
Immediate Anesthesia Transfer of Care Note  Patient: Simonne Boulos Grubbs-Livengood  Procedure(s) Performed: HYSTEROSCOPY WITH HYDROTHERMAL ABLATION (Uterus)  Patient Location: PACU  Anesthesia Type:General  Level of Consciousness: awake, alert , oriented and patient cooperative  Airway & Oxygen Therapy: Patient Spontanous Breathing and Patient connected to nasal cannula oxygen  Post-op Assessment: Report given to RN and Post -op Vital signs reviewed and stable  Post vital signs: Reviewed and stable  Last Vitals:  Vitals Value Taken Time  BP    Temp    Pulse 98 10/13/21 1532  Resp 15 10/13/21 1532  SpO2 99 % 10/13/21 1532  Vitals shown include unvalidated device data.  Last Pain:  Vitals:   10/13/21 1248  TempSrc: Oral  PainSc: 3       Patients Stated Pain Goal: 4 (10/13/21 1248)  Complications: No notable events documented.

## 2021-10-14 ENCOUNTER — Encounter (HOSPITAL_BASED_OUTPATIENT_CLINIC_OR_DEPARTMENT_OTHER): Payer: Self-pay | Admitting: Obstetrics and Gynecology

## 2021-10-14 LAB — SURGICAL PATHOLOGY

## 2021-11-16 ENCOUNTER — Encounter: Payer: Self-pay | Admitting: Neurology

## 2021-11-16 ENCOUNTER — Other Ambulatory Visit: Payer: Self-pay | Admitting: Neurology

## 2021-11-17 ENCOUNTER — Encounter: Payer: Self-pay | Admitting: *Deleted

## 2021-12-02 ENCOUNTER — Institutional Professional Consult (permissible substitution): Payer: BC Managed Care – PPO | Admitting: Neurology

## 2021-12-07 ENCOUNTER — Encounter: Payer: Self-pay | Admitting: Neurology

## 2021-12-07 ENCOUNTER — Ambulatory Visit (INDEPENDENT_AMBULATORY_CARE_PROVIDER_SITE_OTHER): Payer: BC Managed Care – PPO | Admitting: Neurology

## 2021-12-07 VITALS — BP 167/103 | HR 90 | Ht 62.0 in | Wt 210.0 lb

## 2021-12-07 DIAGNOSIS — G43101 Migraine with aura, not intractable, with status migrainosus: Secondary | ICD-10-CM | POA: Diagnosis not present

## 2021-12-07 DIAGNOSIS — B0229 Other postherpetic nervous system involvement: Secondary | ICD-10-CM | POA: Diagnosis not present

## 2021-12-07 MED ORDER — AJOVY 225 MG/1.5ML ~~LOC~~ SOSY: 225.0000 mg | PREFILLED_SYRINGE | SUBCUTANEOUS | 3 refills | Status: DC

## 2021-12-07 MED ORDER — DULOXETINE HCL 60 MG PO CPEP: 60.0000 mg | ORAL_CAPSULE | Freq: Every day | ORAL | 11 refills | Status: DC

## 2021-12-07 MED ORDER — PREGABALIN 150 MG PO CAPS: 150.0000 mg | ORAL_CAPSULE | Freq: Three times a day (TID) | ORAL | 5 refills | Status: DC

## 2021-12-07 MED ORDER — ZOLMITRIPTAN 5 MG PO TBDP: 5.0000 mg | ORAL_TABLET | ORAL | 11 refills | Status: DC | PRN

## 2021-12-07 MED ORDER — LIDOCAINE-PRILOCAINE 2.5-2.5 % EX CREA: 1.0000 "application " | TOPICAL_CREAM | CUTANEOUS | 11 refills | Status: AC | PRN

## 2021-12-07 NOTE — Patient Instructions (Addendum)
When you get severe prolonged migraine headaches,  You can start rizatriptan treatment,  Either zomig or Relpax, Combine with Zofran 4 mg as needed Flexeril 10 mg as needed as a muscle relaxant Over-the-counter Aleve 1 to 2 tablets Even Benadryl 25 to 50 mg Heating pad Sleep  If headache is more than 24 to 48 hours, please contact my office during working hours, we can provide nerve block, or IV infusion for migraine treatment,  Stop ibuprofen use for postherpetic neuralgia Increase Lyrica from 100 to 150 mg 3 times a day Add on Cymbalta 60 mg daily   EMLA gel + Voltaren Gel as needed for shingle pain.
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
## 2021-12-10 ENCOUNTER — Encounter: Payer: Self-pay | Admitting: Neurology

## 2021-12-16 ENCOUNTER — Ambulatory Visit: Payer: BC Managed Care – PPO | Admitting: Neurology

## 2021-12-16 ENCOUNTER — Institutional Professional Consult (permissible substitution): Payer: BC Managed Care – PPO | Admitting: Neurology

## 2021-12-27 ENCOUNTER — Encounter: Payer: Self-pay | Admitting: *Deleted

## 2021-12-27 ENCOUNTER — Other Ambulatory Visit: Payer: Self-pay | Admitting: Neurology

## 2021-12-30 ENCOUNTER — Other Ambulatory Visit: Payer: Self-pay | Admitting: Neurology

## 2021-12-30 NOTE — Telephone Encounter (Signed)
Rx refilled for 90 day supply. 

## 2022-01-16 ENCOUNTER — Other Ambulatory Visit: Payer: Self-pay | Admitting: Neurology

## 2022-01-17 ENCOUNTER — Encounter: Payer: Self-pay | Admitting: Neurology

## 2022-01-19 ENCOUNTER — Other Ambulatory Visit: Payer: Self-pay | Admitting: Neurology

## 2022-01-27 ENCOUNTER — Other Ambulatory Visit: Payer: Self-pay | Admitting: Neurology

## 2022-01-27 NOTE — Telephone Encounter (Signed)
Rx refilled.

## 2022-03-06 ENCOUNTER — Other Ambulatory Visit: Payer: Self-pay | Admitting: Neurology

## 2022-03-07 NOTE — Telephone Encounter (Signed)
Rx refilled.

## 2022-03-16 DIAGNOSIS — Z0289 Encounter for other administrative examinations: Secondary | ICD-10-CM

## 2022-05-22 ENCOUNTER — Other Ambulatory Visit: Payer: Self-pay | Admitting: Neurology

## 2022-06-06 ENCOUNTER — Encounter: Payer: Self-pay | Admitting: Neurology

## 2022-06-06 ENCOUNTER — Ambulatory Visit (INDEPENDENT_AMBULATORY_CARE_PROVIDER_SITE_OTHER): Payer: BC Managed Care – PPO | Admitting: Neurology

## 2022-06-06 VITALS — BP 128/78 | HR 80 | Ht 62.0 in | Wt 228.6 lb

## 2022-06-06 DIAGNOSIS — B0229 Other postherpetic nervous system involvement: Secondary | ICD-10-CM

## 2022-06-06 DIAGNOSIS — G43101 Migraine with aura, not intractable, with status migrainosus: Secondary | ICD-10-CM
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
## 2022-06-06 NOTE — Patient Instructions (Signed)
When you do get a migraine headaches,  You can start with Triptan, either Relpax, Zomig as needed,  It is okay to combine it with Ubrelvy, NSAIDs such as ibuprofen or Aleve, Zofran for nausea, Flexeril as needed as muscle relaxant Heating pad Sleep.

## 2022-06-24 ENCOUNTER — Other Ambulatory Visit: Payer: Self-pay | Admitting: Neurology

## 2022-06-24 NOTE — Telephone Encounter (Signed)
Verify Drug Registry For Pregabalin 150 Mg Capsule Last Filled: 05/26/2022 Quantity: 90 capsules for 30 days Last appointment: 06/06/2022 Next appointment: 12/05/2022

## 2022-08-31 ENCOUNTER — Other Ambulatory Visit: Payer: Self-pay | Admitting: Neurology

## 2022-11-02 ENCOUNTER — Other Ambulatory Visit: Payer: Self-pay

## 2022-11-02 ENCOUNTER — Encounter: Payer: Self-pay | Admitting: Neurology

## 2022-11-02 MED ORDER — ELETRIPTAN HYDROBROMIDE 40 MG PO TABS: 40.0000 mg | ORAL_TABLET | ORAL | 0 refills | Status: DC | PRN

## 2022-12-05 ENCOUNTER — Encounter: Payer: Self-pay | Admitting: Neurology

## 2022-12-05 ENCOUNTER — Ambulatory Visit (INDEPENDENT_AMBULATORY_CARE_PROVIDER_SITE_OTHER): Payer: BC Managed Care – PPO | Admitting: Neurology

## 2022-12-05 VITALS — BP 134/88 | HR 84 | Ht 62.0 in | Wt 212.0 lb

## 2022-12-05 DIAGNOSIS — G43101 Migraine with aura, not intractable, with status migrainosus: Secondary | ICD-10-CM

## 2022-12-05 DIAGNOSIS — B0229 Other postherpetic nervous system involvement: Secondary | ICD-10-CM | POA: Diagnosis not present

## 2022-12-05 MED ORDER — ONDANSETRON HCL 4 MG PO TABS: 4.0000 mg | ORAL_TABLET | Freq: Three times a day (TID) | ORAL | 5 refills | Status: DC | PRN

## 2022-12-05 MED ORDER — PROPRANOLOL HCL ER 80 MG PO CP24: 80.0000 mg | ORAL_CAPSULE | Freq: Every day | ORAL | 3 refills | Status: DC

## 2022-12-05 MED ORDER — PREGABALIN 50 MG PO CAPS: 50.0000 mg | ORAL_CAPSULE | Freq: Three times a day (TID) | ORAL | 5 refills | Status: DC

## 2022-12-05 MED ORDER — ELETRIPTAN HYDROBROMIDE 40 MG PO TABS: 40.0000 mg | ORAL_TABLET | ORAL | 11 refills | Status: DC | PRN

## 2022-12-05 MED ORDER — NORTRIPTYLINE HCL 25 MG PO CAPS: 50.0000 mg | ORAL_CAPSULE | Freq: Every day | ORAL | 4 refills | Status: DC

## 2022-12-05 MED ORDER — AJOVY 225 MG/1.5ML ~~LOC~~ SOSY: 225.0000 mg | PREFILLED_SYRINGE | SUBCUTANEOUS | 3 refills | Status: DC

## 2022-12-05 MED ORDER — DULOXETINE HCL 60 MG PO CPEP: 60.0000 mg | ORAL_CAPSULE | Freq: Every day | ORAL | 3 refills | Status: DC
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
## 2023-01-15 ENCOUNTER — Other Ambulatory Visit: Payer: Self-pay | Admitting: Neurology

## 2023-01-19 ENCOUNTER — Other Ambulatory Visit: Payer: Self-pay

## 2023-01-19 ENCOUNTER — Encounter: Payer: Self-pay | Admitting: Neurology

## 2023-01-19 MED ORDER — CYCLOBENZAPRINE HCL 10 MG PO TABS: ORAL_TABLET | ORAL | 11 refills | Status: DC

## 2023-05-14 ENCOUNTER — Other Ambulatory Visit: Payer: Self-pay | Admitting: Neurology

## 2023-05-19 LAB — COLOGUARD

## 2023-06-22 ENCOUNTER — Ambulatory Visit (INDEPENDENT_AMBULATORY_CARE_PROVIDER_SITE_OTHER): Payer: BC Managed Care – PPO | Admitting: Licensed Clinical Social Worker

## 2023-06-22 DIAGNOSIS — F331 Major depressive disorder, recurrent, moderate: Secondary | ICD-10-CM | POA: Diagnosis not present

## 2023-06-22 DIAGNOSIS — F411 Generalized anxiety disorder: Secondary | ICD-10-CM

## 2023-06-22 NOTE — Progress Notes (Signed)
[facility] Counselor/Therapist Progress Note  [name], MRN: [id number]    Date: 06/22/23  Time Spent: 0315  pm - 0415 pm : 60 Minutes  Treatment Type: Individual Therapy.  Reported Symptoms: Symptoms of stress, work is overwhelming  Mental Status Exam: Appearance:  Casual     Behavior: Appropriate  Motor: Normal  Speech/Language:  Clear and Coherent  Affect: Flat  Mood: depressed  Thought process: normal  Thought content:   WNL  Sensory/Perceptual disturbances:   WNL  Orientation: oriented to person, place, time/date, situation, day of week, month of year, and year  Attention: Good  Concentration: Good  Memory: WNL  Fund of knowledge:  Good  Insight:   Good  Judgment:  Good  Impulse Control: Good   Risk Assessment: Danger to Self: NO Self-injurious Behavior: NO Danger to Others: No Duty to Warn:no Physical Aggression / Violence:No  Access to Firearms a concern: No  Gang Involvement:No   Subjective:   [name] participated from office located at [facility] with Clinician present.  [name] consented to treatment.   Presenting Problem Chief Complaint: Stress, medical issues, up tick in migraines, best friend diagnosed with Cancer  What are the main stressors in your life right now, how long? Depression  3, Anxiety   3, Appetite Change   3, Work Problems   3, Memory Problems   3, Loss of Interest   3, Excessive Worrying   3, Low Energy   3, and Obsessive Thoughts   3   Previous mental health services Have you ever been treated for a mental health problem, when, where, by whom? No  NA   Are you currently seeing a therapist or counselor, counselor's name? No NA  Have you ever had a mental health hospitalization, how many times, length of stay? No NA  Have you ever been treated with medication, name, reason, response? Yes ,Clonazepam, Cymbalta  Have you ever had suicidal thoughts or attempted  suicide, when, how? No NA  Risk factors for Suicide Demographic factors:  Divorced or widowed Current mental status: No plan to harm self or others Loss factors: NA Historical factors: NA Risk Reduction factors: Sense of responsibility to family, Employed, Living with another person, especially a relative, and Positive social support Clinical factors:  Severe Anxiety and/or Agitation Cognitive features that contribute to risk: NA    SUICIDE RISK:  Minimal: No identifiable suicidal ideation.  Patients presenting with no risk factors but with morbid ruminations; may be classified as minimal risk based on the severity of the depressive symptoms  Medical history Medical treatment and/or problems, explain: Yes Diabetes, rumatoid Arthrits high Cholesterol Do you have any issues with chronic pain?  Yes Migraines and joint pain Name of primary care physician/last physical exam: Katie Hemberg  Allergies: Yes Medication, reactions? Prednisone, latex, felodipine ER, Propoxyphene, Enbrel, Imitrex, Metphormin   Current medications:    Taking? Last Dose Start Date End Date Provider   Bepotastine Besilate 1.5 % SOLN  Taking  --  --  [provider]   cyclobenzaprine (FLEXERIL) 10 MG tablet  Taking  01/19/23  --  Levert Feinstein, MD   TAKE 1 TABLET BY MOUTH DAILY AS NEEDED FOR MUSCLE SPASMS.   DULoxetine (CYMBALTA) 60 MG capsule  Taking  12/05/22  --  Levert Feinstein, MD   Take 1 capsule (60 mg total) by mouth daily.   eletriptan (RELPAX) 40 MG tablet  Taking  12/05/22  --  Levert Feinstein, MD  Take 1 tablet (40 mg total) by mouth as needed for migraine or headache. May repeat in 2 hours if headache persists or recurs.   Notes:  Do not refill in less than 30 days.Cancel [facility] Rx.   ezetimibe-simvastatin (VYTORIN) 10-40 MG tablet  Taking  --  --  [provider]   folic acid (FOLVITE) 1 MG tablet  Taking  01/24/18  --  Pollyann Savoy, MD   TAKE 2 TABLETS (2 MG TOTAL) BY MOUTH DAILY.    Fremanezumab-vfrm (AJOVY) 225 MG/1.5ML SOSY  Taking  12/05/22  --  Levert Feinstein, MD   Inject 225 mg into the skin every 30 (thirty) days.   HYDROcodone-acetaminophen (NORCO) 7.5-325 MG tablet  Taking  --  --  [provider]   ibuprofen (ADVIL) 800 MG tablet  Taking  --  --  [provider]   levocetirizine (XYZAL) 5 MG tablet  Taking  02/21/18  --  [provider]   lidocaine-prilocaine (EMLA) cream  Not Taking  12/07/21  --  Levert Feinstein, MD   Apply 1 application topically as needed.   Patient not taking: Reported on 06/22/2023   montelukast (SINGULAIR) 10 MG tablet  Taking  02/21/18  --  [provider]   Multiple Vitamin (MULITIVITAMIN WITH MINERALS) TABS  Taking  --  --  [provider]   nortriptyline (PAMELOR) 25 MG capsule  Not Taking  12/05/22  --  Levert Feinstein, MD   Take 2 capsules (50 mg total) by mouth at bedtime.   Patient not taking: Reported on 06/22/2023   Notes:  Supervising Physician: Levert Feinstein, MD NPI: [id number] DEA: [id number]   omeprazole (PRILOSEC) 20 MG capsule  Taking  --  --  [provider]   ondansetron (ZOFRAN) 4 MG tablet  Taking  05/15/23  --  Levert Feinstein, MD   TAKE 1 TABLET BY MOUTH EVERY 8 HOURS AS NEEDED FOR NAUSEA AND VOMITING   pregabalin (LYRICA) 50 MG capsule  Taking  12/05/22  --  Levert Feinstein, MD   Take 1 capsule (50 mg total) by mouth 3 (three) times daily.   propranolol ER (INDERAL LA) 80 MG 24 hr capsule  Taking  12/05/22  --  Levert Feinstein, MD   Take 1 capsule (80 mg total) by mouth daily.   RYBELSUS 14 MG TABS  Taking  05/18/22  --  [provider]   Tofacitinib Citrate ER (XELJANZ XR) 11 MG TB24  Taking  10/14/19  --  [provider]   Consider Medication Status and Discontinue    Taking? Last Dose Start Date End Date Provider   JARDIANCE 10 MG TABS tablet   --  --  --  [provider]   Prescribed by: Marin Olp Is there any history of mental health problems or substance  abuse in your family, whom? No NA Has anyone in your family been hospitalized, who, where, length of stay? No NA  Social/family history Have you been married, how many times?  2  Do you have children?  0  How many pregnancies have you had?  1  Who lives in your current household? Patient and husband Darell  Military history: No NA  Religious/spiritual involvement: Church What religion/faith base are you? Christian  Family of origin (childhood history)  Patient, Mom and Dad sister  Where were you born? [place] Where did you grow up? [place] How many different homes have you lived? 2 Describe the atmosphere of the household  where you grew up: "Very traditional, father had 2 jobs, Paramedic and healthy." Do you have siblings, step/half siblings, list names, relation, sex, age? Yes Sister-Mary 45  Are your parents separated/divorced, when and why? Yes NA  Are your parents alive? Yes Mother alive and Father passed.  Social supports (personal and professional): Husband, sister and Mother  Education How many grades have you completed? some college Did you have any problems in school, what type? No ADD and dyslexia Medications prescribed for these problems? No NA  Employment (financial issues):Full time for [employer], Denied fiancial issues.   Legal history:Denied   Trauma/Abuse history: Have you ever been exposed to any form of abuse, what type? No   Have you ever been exposed to something traumatic, describe? Yes Passing of father  Substance use Do you use Caffeine? Yes Type, frequency? 3 cokes  Do you use Nicotine? No Type, frequency, ppd?    Do you use Alcohol? Yes Type, frequency? Occasional  How old were you went you first tasted alcohol? 17 Was this accepted by your family? No  When was your last drink, type, how much?   Have you ever used illicit drugs or taken more than prescribed, type, frequency, date of last usage? No      Diagnosis AXIS I Generalized Anxiety Disorder, Major Depressive Disorder, recurrent moderate  AXIS II No diagnosis  AXIS III @PMH @  AXIS IV other psychosocial or environmental problems  AXIS V 61-70 mild symptoms   Plan: Treatment Plan:  PT reports stress and anxiety and depression. Patient reports stress with work  Client Abilities/Strengths: "I am a good Comptroller, good at de-escalating, Engineer, building services and caring."  Support System: Husband, Mother and Sister   Psychologist, sport and exercise Therapy  Client Statement of Needs       "I want to improve my coping skills   Treatment Level Every other week/MODERATE  Symptoms: Depression and anxiety,   Goals: "Improve coping skills for stress and grief."  Target Date: 06/21/2024 Frequency: bi weekly  Progress: 0 Modality: individual    Therapist will provide referrals for additional resources as appropriate.  Therapist will provide psycho-education regarding anxiety and depression related to her grief  diagnosis and corresponding treatment approaches and interventions. Licensed Clinical Social Worker, Phyllis Ginger, LCSW will support the patient's ability to achieve the goals identified. will employ CBT, BA, Problem-solving, Solution Focused, Mindfulness,  coping skills, & other evidenced-based practices will be used to promote progress towards healthy functioning to help manage decrease symptoms associated with her diagnosis.   Reduce overall level, frequency, and intensity of the feelings of depression, anxiety and panic evidenced by decreased from 6 to 7 days/week to 0 to 1 days/week per client report for at least 3 consecutive months. Verbally express understanding of the relationship between feelings of depression, anxiety and their impact on thinking patterns and behaviors. Verbalize an understanding of the role that distorted thinking plays in creating fears, excessive worry, and ruminations.             [name] participated in the creation of the treatment plan.  Phyllis Ginger MSW, LCSW  _________________________________________         Interventions: Cognitive Behavioral Therapy  Diagnosis: Major Depressive Disorder, recurrent, moderate   Plan: [name]  is to use CBT, mindfulness and coping skills to help manage decrease symptoms associated with their diagnosis.   Long-term goal:   [name] will reduce overall level, frequency, and intensity of the feelings of depression, anxiety and  panic evidenced by decreased irritability, negative self talk, and helpless feelings from 6 to 7 days/week to 0 to 2  days/week per client report for at least 3 consecutive months.  Short-term goal:  [name] will verbally express understanding of the relationship between feelings of depression, anxiety and their impact on thinking patterns and behaviors. Verbalize an understanding of the role that distorted thinking plays in creating fears, excessive worry, and ruminations.  Phyllis Ginger MSW, LCSW DATE: 06/22/2023

## 2023-06-24 ENCOUNTER — Other Ambulatory Visit: Payer: Self-pay | Admitting: Neurology

## 2023-06-27 NOTE — Telephone Encounter (Signed)
Requested Prescriptions   Pending Prescriptions Disp Refills   pregabalin (LYRICA) 50 MG capsule [Pharmacy Med Name: PREGABALIN 50 MG CAPSULE] 90 capsule     Sig: TAKE 1 CAPSULE BY MOUTH 3 TIMES DAILY.   Last seen 12/05/22, next appt scheduled 09/05/23 Dispenses  Routing to provider to fill Dispensed Days Supply Quantity Provider Pharmacy  PREGABALIN 50 MG CAPSULE 05/15/2023 30 90 each Levert Feinstein, MD [facility] [phone] - [facility]...  PREGABALIN 50 MG CAPSULE 03/24/2023 30 90 each Levert Feinstein, MD [facility] [phone] - [facility]...  PREGABALIN 50 MG CAPSULE 02/13/2023 30 90 each Levert Feinstein, MD [facility] [phone] - [facility]...  PREGABALIN 150 MG CAPSULE 12/19/2022 30 90 each Levert Feinstein, MD [facility] [phone] - [facility]...  PREGABALIN 50 MG CAPSULE 12/05/2022 30 90 each Levert Feinstein, MD [facility] [phone] - [facility]...  PREGABALIN 150 MG CAPSULE 10/31/2022 30 90 each Levert Feinstein, MD [facility] [phone] - [facility]...  PREGABALIN 150 MG CAPSULE 09/29/2022 30 90 each Levert Feinstein, MD [facility] [phone] - [facility]...  PREGABALIN 150 MG CAPSULE 08/25/2022 30 90 each Levert Feinstein, MD [facility] [phone] - [facility]...  PREGABALIN 150 MG CAPSULE 07/27/2022 30 90 each Levert Feinstein, MD [facility] [phone] - [address]

## 2023-06-28 ENCOUNTER — Ambulatory Visit: Payer: BC Managed Care – PPO | Admitting: Licensed Clinical Social Worker

## 2023-07-12 LAB — EXTERNAL GENERIC LAB PROCEDURE

## 2023-07-12 LAB — COLOGUARD

## 2023-07-20 ENCOUNTER — Ambulatory Visit: Payer: BC Managed Care – PPO | Admitting: Licensed Clinical Social Worker

## 2023-07-20 DIAGNOSIS — F331 Major depressive disorder, recurrent, moderate: Secondary | ICD-10-CM

## 2023-07-20 NOTE — Progress Notes (Signed)
[facility] Counselor/Therapist Progress Note  [name], MRN: [id number]    Date: 07/20/23  Time Spent: 103  pm - 153 pm : 50 Minutes  Treatment Type: Individual Therapy.  Reported Symptoms: Symptoms of depression and stress from being stretched in many directions.  Mental Status Exam: Appearance:  Casual     Behavior: Appropriate  Motor: Normal  Speech/Language:  Clear and Coherent  Affect: Appropriate  Mood: normal  Thought process: normal  Thought content:   WNL  Sensory/Perceptual disturbances:   WNL  Orientation: oriented to person, place, time/date, situation, day of week, month of year, and year  Attention: Good  Concentration: Good  Memory: WNL  Fund of knowledge:  Good  Insight:   Good  Judgment:  Good  Impulse Control: Good   Risk Assessment: Danger to Self:  No Self-injurious Behavior: No Danger to Others: No Duty to Warn:no Physical Aggression / Violence:No  Access to Firearms a concern: No  Gang Involvement:No   Subjective:   [name] participated from office located at [facility] with Clinician present. [name] consented to treatment.   [name] presented to her session reporting feeling stressed and tired. Patient reports that  she attempts to work from home and has issues separating work from her home space.  Patient reports that she needs to help with family and friends who are struggling sickness and childcare issues. Clinician processed with patient the idea of not being able to pour from an empty cup. Patient agreed to take time for self-care and to consider setting boundaries with others so she can focus on her self-care.  Interventions: Cognitive Behavioral Therapy  Diagnosis: Major depressive Disorder, recurrent, moderate   Plan: [name] is to use CBT, mindfulness and coping skills to help manage decrease symptoms associated with their diagnosis.   Long-term goal:   [name] will  reduce overall level, frequency, and intensity of the feelings of depression and anxiety  evidenced by       decreased irritability, negative self talk, and helpless feelings from 6 to 7 days/week to 0 to 2 days/week per client report for at least 3 consecutive months.Treatment plan to be reviewed by 06/21/2024.  Short-term goal:  [name] will verbally express understanding of the relationship between feelings of depression, anxiety and their impact on thinking patterns and behaviors. Verbalize an understanding of the role that distorted thinking plays in creating fears, excessive worry, and ruminations.  [name] MSW, LCSW DATE:07/20/2023

## 2023-08-17 ENCOUNTER — Ambulatory Visit: Payer: BC Managed Care – PPO | Admitting: Licensed Clinical Social Worker

## 2023-08-17 DIAGNOSIS — F331 Major depressive disorder, recurrent, moderate: Secondary | ICD-10-CM

## 2023-08-17 DIAGNOSIS — F339 Major depressive disorder, recurrent, unspecified: Secondary | ICD-10-CM | POA: Diagnosis not present

## 2023-08-17 DIAGNOSIS — F411 Generalized anxiety disorder: Secondary | ICD-10-CM

## 2023-08-17 NOTE — Progress Notes (Signed)
[facility] Counselor/Therapist Progress Note  [name], MRN: [id number]    Date: 08/17/23  Time Spent: 0302  pm - 0349 pm : 47 Minutes  Treatment Type: Individual Therapy.  Reported Symptoms: Depression and stress related to possible write up at work.  Mental Status Exam: Appearance:  Casual     Behavior: Appropriate  Motor: Normal  Speech/Language:  Clear and Coherent  Affect: Appropriate  Mood: normal  Thought process: normal  Thought content:   WNL  Sensory/Perceptual disturbances:   WNL  Orientation: oriented to person, place, time/date, situation, day of week, month of year, and year  Attention: Good  Concentration: Good  Memory: WNL  Fund of knowledge:  Good  Insight:   Good  Judgment:  Good  Impulse Control: Good   Risk Assessment: Danger to Self:  No Self-injurious Behavior: No Danger to Others: No Duty to Warn:no Physical Aggression / Violence:No  Access to Firearms a concern: No  Gang Involvement:No   Subjective:   Dollene Primrose participated from office, located at [facility] with Clinician present. Modesta consented to treatment.   Tatyanah presented for her session stating that she has been stressed about some medical issues and testing her husband has been going through. Patient shared that she has also been very stressed about getting wrote up on Monday at work due to not meeting time requirements for her calls with [employer]. Patient has been employed with [employer] for 13 years and has an excellent record. 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 Patient reports having a good record and no complaints on her record. She is very serious about her job and is distressed about the idea of being wrote up. Clinician encouraged patient to make a list of her concerns and  be prepared to present them when given an opportunity. Patient and Clinician discussed the importance of dwelling on the positive and not to jump to conclusions before the need is there. Clinician had patient to share the things she does well in order revert the focus on the positive rather than the negative.     Interventions: Cognitive Behavioral Therapy, Assertiveness/Communication, and Solution-Oriented/Positive Psychology  Diagnosis: Major Depressive Disorder, recurrent, Generalize Anxiety Disorder   Plan: Jenina is to use CBT, mindfulness and coping skills to help manage decrease symptoms associated with their diagnosis.   Long-term goal:   Alexandre will reduce overall level, frequency, and intensity of the feelings of depression and anxiety evidenced by decreased irritability, negative self talk, and helpless feelings from 6 to 7 days/week to 0 to 2 days/week per client report for at least 3 consecutive months. Treatment plan to be reviewed by 06/21/2024.  Short-term goal:  Vader will verbally express understanding of the relationship between feelings of depression, anxiety and their impact on thinking patterns and behaviors. Verbalize an understanding of the role that distorted thinking plays in creating fears, excessive worry, and ruminations.  Phyllis Ginger MSW, LCSW DATE: 08/17/2023

## 2023-08-31 ENCOUNTER — Ambulatory Visit: Payer: BC Managed Care – PPO | Admitting: Licensed Clinical Social Worker
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
## 2023-09-05 ENCOUNTER — Encounter: Payer: Self-pay | Admitting: Neurology

## 2023-09-05 ENCOUNTER — Ambulatory Visit: Payer: BC Managed Care – PPO | Admitting: Neurology

## 2023-09-28 ENCOUNTER — Other Ambulatory Visit: Payer: Self-pay | Admitting: Neurology

## 2023-12-31 ENCOUNTER — Other Ambulatory Visit: Payer: Self-pay | Admitting: Neurology

## 2024-01-02 ENCOUNTER — Other Ambulatory Visit: Payer: Self-pay | Admitting: Neurology

## 2024-01-03 ENCOUNTER — Other Ambulatory Visit: Payer: Self-pay | Admitting: Neurology

## 2024-01-04 ENCOUNTER — Ambulatory Visit: Admitting: Licensed Clinical Social Worker

## 2024-01-11 ENCOUNTER — Ambulatory Visit: Admitting: Licensed Clinical Social Worker

## 2024-01-30 ENCOUNTER — Ambulatory Visit (INDEPENDENT_AMBULATORY_CARE_PROVIDER_SITE_OTHER): Admitting: Neurology

## 2024-01-30 ENCOUNTER — Encounter: Payer: Self-pay | Admitting: Neurology

## 2024-01-30 VITALS — BP 128/86 | HR 78 | Ht 62.0 in | Wt 202.0 lb

## 2024-01-30 DIAGNOSIS — B0229 Other postherpetic nervous system involvement: Secondary | ICD-10-CM | POA: Diagnosis not present

## 2024-01-30 DIAGNOSIS — G43719 Chronic migraine without aura, intractable, without status migrainosus: Secondary | ICD-10-CM | POA: Diagnosis not present

## 2024-01-30 MED ORDER — QULIPTA 60 MG PO TABS: 60.0000 mg | ORAL_TABLET | Freq: Every day | ORAL | 11 refills | Status: DC

## 2024-01-30 MED ORDER — ELETRIPTAN HYDROBROMIDE 40 MG PO TABS: 40.0000 mg | ORAL_TABLET | ORAL | 11 refills | Status: AC | PRN

## 2024-01-30 MED ORDER — ONDANSETRON 4 MG PO TBDP: 4.0000 mg | ORAL_TABLET | Freq: Three times a day (TID) | ORAL | 6 refills | Status: AC | PRN
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
## 2024-03-12 ENCOUNTER — Encounter: Payer: Self-pay | Admitting: Neurology

## 2024-04-17 ENCOUNTER — Encounter: Payer: Self-pay | Admitting: Neurology

## 2024-04-17 ENCOUNTER — Telehealth: Payer: Self-pay | Admitting: Neurology

## 2024-04-17 NOTE — Telephone Encounter (Signed)
 LVM and sent mychart msg informing pt of need to reschedule 08/06/24 appt - MD out

## 2024-04-29 ENCOUNTER — Other Ambulatory Visit: Payer: Self-pay | Admitting: Neurology

## 2024-06-05 ENCOUNTER — Encounter: Payer: Self-pay | Admitting: Neurology

## 2024-06-05 DIAGNOSIS — G43719 Chronic migraine without aura, intractable, without status migrainosus: Secondary | ICD-10-CM

## 2024-06-05 DIAGNOSIS — B0229 Other postherpetic nervous system involvement: Secondary | ICD-10-CM

## 2024-06-12 NOTE — Telephone Encounter (Signed)
 Needs Botox auth. New start.   Chronic Migraine CPT 64615    Botox J0585 Units:200 EMG GUIDANCE: 04125   G43.719

## 2024-06-12 NOTE — Telephone Encounter (Signed)
 Start BOTOX prior authorization, cancel her follow up with me, if approved, ok to put her on NP for injection

## 2024-06-12 NOTE — Addendum Note (Signed)
 Addended by: Rihana Kiddy on: 06/12/2024 03:00 PM   Modules accepted: Orders

## 2024-06-13 ENCOUNTER — Telehealth: Payer: Self-pay | Admitting: Neurology

## 2024-06-13 DIAGNOSIS — G43101 Migraine with aura, not intractable, with status migrainosus: Secondary | ICD-10-CM

## 2024-06-13 NOTE — Telephone Encounter (Signed)
 Neurology referral faxed to [facility] (fax# [phone], phone# [phone])

## 2024-06-13 NOTE — Telephone Encounter (Addendum)
 Submitted benefit verification, will update once results are received.

## 2024-06-14 NOTE — Telephone Encounter (Signed)
 Completed [employer] [place] form and faxed with notes to [phone].

## 2024-07-01 ENCOUNTER — Ambulatory Visit: Admitting: Licensed Clinical Social Worker

## 2024-07-02 NOTE — Telephone Encounter (Signed)
 Called and spoke with [employer] [place] rep Bri[place], she states shara was received but case was not started. I was able to start the case over the phone with her, status is pending with case # [id number].

## 2024-07-04 NOTE — Telephone Encounter (Signed)
 Received fax that Botox was denied. The denial states the notes did not reflect that pt does not have migraines for at least 4 hours for 15 days a month. The note does state 20 migraines a month, but it doesn't state the length of the migraines. I can appeal this decision if you'd like, I have a copy of denial letter scanned under media.

## 2024-07-09 NOTE — Telephone Encounter (Signed)
 Completed appeal form and faxed with pt's response to [phone].

## 2024-07-22 ENCOUNTER — Ambulatory Visit: Admitting: Licensed Clinical Social Worker

## 2024-07-22 NOTE — Telephone Encounter (Signed)
 Called [employer] [place] to check on appeal, spoke with Rankin. He states appeal request was received but is still being worked on. Reference # for the call is [id number].

## 2024-07-30 ENCOUNTER — Encounter: Payer: Self-pay | Admitting: Neurology

## 2024-08-01 MED ORDER — ONABOTULINUMTOXINA 200 UNITS IJ SOLR: INTRAMUSCULAR | 2 refills | Status: AC

## 2024-08-01 NOTE — Addendum Note (Signed)
 Addended by: JOSHUA MAURILIO CROME on: 08/01/2024 01:54 PM   Modules accepted: Orders

## 2024-08-01 NOTE — Telephone Encounter (Signed)
 Called [employer] [place] and spoke with Cam. She states shara has been approved, but the approval letter has not been uploaded yet for her to fax to me. Auth#: [id number] (07/05/24-07/05/25)  Please send rx to [facility], thank you!

## 2024-08-06 ENCOUNTER — Ambulatory Visit (INDEPENDENT_AMBULATORY_CARE_PROVIDER_SITE_OTHER): Admitting: Neurology

## 2024-08-22 ENCOUNTER — Ambulatory Visit: Admitting: Neurology

## 2024-08-22 ENCOUNTER — Encounter: Payer: Self-pay | Admitting: Neurology

## 2024-08-22 VITALS — BP 116/88 | HR 76 | Resp 15

## 2024-08-22 DIAGNOSIS — G43E01 Chronic migraine with aura, not intractable, with status migrainosus: Secondary | ICD-10-CM | POA: Diagnosis not present

## 2024-08-22 DIAGNOSIS — B0229 Other postherpetic nervous system involvement: Secondary | ICD-10-CM

## 2024-08-22 DIAGNOSIS — G43101 Migraine with aura, not intractable, with status migrainosus: Secondary | ICD-10-CM

## 2024-08-22 MED ORDER — DULOXETINE HCL 60 MG PO CPEP: 60.0000 mg | ORAL_CAPSULE | Freq: Every day | ORAL | 3 refills | Status: AC

## 2024-08-22 MED ORDER — ONABOTULINUMTOXINA 200 UNITS IJ SOLR: 200.0000 [IU] | Freq: Once | INTRAMUSCULAR | Status: AC
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
## 2024-08-22 NOTE — Progress Notes (Signed)
 Botox- 200 units x 1 vial Lot: [id number] Expiration: 2027/06 NDC: 0023-3921-02  Bacteriostatic 0.9% Sodium Chloride - 4 mL  Lot: [id number] Expiration: 08/23/25 NDC: 9590803397  Dx: A97.70 , G43.719  S/P  Witnessed by JESUSA Stain CMA

## 2024-08-28 ENCOUNTER — Ambulatory Visit: Admitting: Neurology

## 2024-09-05 ENCOUNTER — Emergency Department (HOSPITAL_COMMUNITY)

## 2024-09-05 ENCOUNTER — Inpatient Hospital Stay (HOSPITAL_COMMUNITY)
Admission: EM | Admit: 2024-09-05 | Discharge: 2024-09-06 | DRG: 917 | Disposition: A | Discharge status: Home / Self Care | Attending: Pulmonary Disease | Admitting: Pulmonary Disease

## 2024-09-05 ENCOUNTER — Other Ambulatory Visit: Payer: Self-pay

## 2024-09-05 DIAGNOSIS — N179 Acute kidney failure, unspecified: Secondary | ICD-10-CM | POA: Diagnosis present

## 2024-09-05 DIAGNOSIS — E875 Hyperkalemia: Secondary | ICD-10-CM

## 2024-09-05 DIAGNOSIS — F32A Depression, unspecified: Secondary | ICD-10-CM | POA: Diagnosis present

## 2024-09-05 DIAGNOSIS — D72829 Elevated white blood cell count, unspecified: Secondary | ICD-10-CM

## 2024-09-05 DIAGNOSIS — E8729 Other acidosis: Principal | ICD-10-CM | POA: Diagnosis present

## 2024-09-05 DIAGNOSIS — G9341 Metabolic encephalopathy: Secondary | ICD-10-CM

## 2024-09-05 DIAGNOSIS — Z8249 Family history of ischemic heart disease and other diseases of the circulatory system: Secondary | ICD-10-CM

## 2024-09-05 DIAGNOSIS — M6282 Rhabdomyolysis: Secondary | ICD-10-CM

## 2024-09-05 DIAGNOSIS — I10 Essential (primary) hypertension: Secondary | ICD-10-CM | POA: Diagnosis present

## 2024-09-05 DIAGNOSIS — Z79622 Long term (current) use of janus kinase inhibitor: Secondary | ICD-10-CM

## 2024-09-05 DIAGNOSIS — J9601 Acute respiratory failure with hypoxia: Secondary | ICD-10-CM | POA: Diagnosis present

## 2024-09-05 DIAGNOSIS — Z79899 Other long term (current) drug therapy: Secondary | ICD-10-CM | POA: Diagnosis not present

## 2024-09-05 DIAGNOSIS — G43909 Migraine, unspecified, not intractable, without status migrainosus: Secondary | ICD-10-CM | POA: Diagnosis present

## 2024-09-05 DIAGNOSIS — F419 Anxiety disorder, unspecified: Secondary | ICD-10-CM | POA: Diagnosis present

## 2024-09-05 DIAGNOSIS — J9602 Acute respiratory failure with hypercapnia: Secondary | ICD-10-CM

## 2024-09-05 DIAGNOSIS — Z808 Family history of malignant neoplasm of other organs or systems: Secondary | ICD-10-CM | POA: Diagnosis not present

## 2024-09-05 DIAGNOSIS — E119 Type 2 diabetes mellitus without complications: Secondary | ICD-10-CM | POA: Diagnosis not present

## 2024-09-05 DIAGNOSIS — M069 Rheumatoid arthritis, unspecified: Secondary | ICD-10-CM | POA: Diagnosis present

## 2024-09-05 DIAGNOSIS — T50901A Poisoning by unspecified drugs, medicaments and biological substances, accidental (unintentional), initial encounter: Secondary | ICD-10-CM

## 2024-09-05 DIAGNOSIS — Z888 Allergy status to other drugs, medicaments and biological substances status: Secondary | ICD-10-CM | POA: Diagnosis not present

## 2024-09-05 DIAGNOSIS — K219 Gastro-esophageal reflux disease without esophagitis: Secondary | ICD-10-CM | POA: Diagnosis present

## 2024-09-05 DIAGNOSIS — Z91040 Latex allergy status: Secondary | ICD-10-CM

## 2024-09-05 DIAGNOSIS — E1140 Type 2 diabetes mellitus with diabetic neuropathy, unspecified: Secondary | ICD-10-CM | POA: Diagnosis present

## 2024-09-05 DIAGNOSIS — T402X1A Poisoning by other opioids, accidental (unintentional), initial encounter: Secondary | ICD-10-CM | POA: Diagnosis present

## 2024-09-05 DIAGNOSIS — Z7984 Long term (current) use of oral hypoglycemic drugs: Secondary | ICD-10-CM

## 2024-09-05 DIAGNOSIS — Z803 Family history of malignant neoplasm of breast: Secondary | ICD-10-CM

## 2024-09-05 DIAGNOSIS — Z83438 Family history of other disorder of lipoprotein metabolism and other lipidemia: Secondary | ICD-10-CM

## 2024-09-05 DIAGNOSIS — N951 Menopausal and female climacteric states: Secondary | ICD-10-CM | POA: Diagnosis present

## 2024-09-05 DIAGNOSIS — Z7985 Long-term (current) use of injectable non-insulin antidiabetic drugs: Secondary | ICD-10-CM

## 2024-09-05 LAB — URINALYSIS, ROUTINE W REFLEX MICROSCOPIC
Bilirubin Urine: NEGATIVE
Glucose, UA: >=500 mg/dL — AB
Hgb urine dipstick: NEGATIVE
Ketones, ur: NEGATIVE mg/dL
Leukocytes,Ua: NEGATIVE
Nitrite: NEGATIVE
Protein, ur: 30 mg/dL — AB
Specific Gravity, Urine: 1.026 (ref 1.005–1.030)
pH: 5 (ref 5.0–8.0)

## 2024-09-05 LAB — BASIC METABOLIC PANEL WITH GFR
Anion gap: 12 (ref 5–15)
BUN: 35 mg/dL — ABNORMAL HIGH (ref 6–20)
CO2: 22 mmol/L (ref 22–32)
Calcium: 9.2 mg/dL (ref 8.9–10.3)
Chloride: 101 mmol/L (ref 98–111)
Creatinine, Ser: 1.26 mg/dL — ABNORMAL HIGH (ref 0.44–1.00)
GFR, Estimated: 52 mL/min — ABNORMAL LOW (ref >60)
Glucose, Bld: 203 mg/dL — ABNORMAL HIGH (ref 70–99)
Potassium: 6 mmol/L — ABNORMAL HIGH (ref 3.5–5.1)
Sodium: 135 mmol/L (ref 135–145)

## 2024-09-05 LAB — BLOOD GAS, VENOUS
Acid-base deficit: 7 mmol/L — ABNORMAL HIGH (ref 0.0–2.0)
Bicarbonate: 24.8 mmol/L (ref 20.0–28.0)
O2 Saturation: 36.7 %
Patient temperature: 37
pCO2, Ven: 78 mmHg (ref 44–60)
pH, Ven: 7.11 — CL (ref 7.25–7.43)
pO2, Ven: <31 mmHg — CL (ref 32–45)

## 2024-09-05 LAB — ACETAMINOPHEN LEVEL: Acetaminophen (Tylenol), Serum: <10 ug/mL — ABNORMAL LOW (ref 10–30)

## 2024-09-05 LAB — CBC WITH DIFFERENTIAL/PLATELET
Abs Immature Granulocytes: 0.12 K/uL — ABNORMAL HIGH (ref 0.00–0.07)
Basophils Absolute: 0 K/uL (ref 0.0–0.1)
Basophils Relative: 0 %
Eosinophils Absolute: 0 K/uL (ref 0.0–0.5)
Eosinophils Relative: 0 %
HCT: 50.2 % — ABNORMAL HIGH (ref 36.0–46.0)
Hemoglobin: 16 g/dL — ABNORMAL HIGH (ref 12.0–15.0)
Immature Granulocytes: 1 %
Lymphocytes Relative: 4 %
Lymphs Abs: 0.7 K/uL (ref 0.7–4.0)
MCH: 29.9 pg (ref 26.0–34.0)
MCHC: 31.9 g/dL (ref 30.0–36.0)
MCV: 93.7 fL (ref 80.0–100.0)
Monocytes Absolute: 1.3 K/uL — ABNORMAL HIGH (ref 0.1–1.0)
Monocytes Relative: 7 %
Neutro Abs: 16.8 K/uL — ABNORMAL HIGH (ref 1.7–7.7)
Neutrophils Relative %: 88 %
Platelets: 392 K/uL (ref 150–400)
RBC: 5.36 MIL/uL — ABNORMAL HIGH (ref 3.87–5.11)
RDW: 13.5 % (ref 11.5–15.5)
WBC: 18.9 K/uL — ABNORMAL HIGH (ref 4.0–10.5)
nRBC: 0.1 % (ref 0.0–0.2)

## 2024-09-05 LAB — PREGNANCY, URINE: Preg Test, Ur: NEGATIVE

## 2024-09-05 LAB — COMPREHENSIVE METABOLIC PANEL WITH GFR
ALT: 36 U/L (ref 0–44)
AST: 62 U/L — ABNORMAL HIGH (ref 15–41)
Albumin: 3.9 g/dL (ref 3.5–5.0)
Alkaline Phosphatase: 127 U/L — ABNORMAL HIGH (ref 38–126)
Anion gap: 12 (ref 5–15)
BUN: 33 mg/dL — ABNORMAL HIGH (ref 6–20)
CO2: 21 mmol/L — ABNORMAL LOW (ref 22–32)
Calcium: 8.7 mg/dL — ABNORMAL LOW (ref 8.9–10.3)
Chloride: 105 mmol/L (ref 98–111)
Creatinine, Ser: 1.25 mg/dL — ABNORMAL HIGH (ref 0.44–1.00)
GFR, Estimated: 53 mL/min — ABNORMAL LOW (ref >60)
Glucose, Bld: 160 mg/dL — ABNORMAL HIGH (ref 70–99)
Potassium: 5 mmol/L (ref 3.5–5.1)
Sodium: 137 mmol/L (ref 135–145)
Total Bilirubin: 0.6 mg/dL (ref 0.0–1.2)
Total Protein: 7 g/dL (ref 6.5–8.1)

## 2024-09-05 LAB — I-STAT CHEM 8, ED
BUN: 58 mg/dL — ABNORMAL HIGH (ref 6–20)
Calcium, Ion: 1.13 mmol/L — ABNORMAL LOW (ref 1.15–1.40)
Chloride: 105 mmol/L (ref 98–111)
Creatinine, Ser: 1.4 mg/dL — ABNORMAL HIGH (ref 0.44–1.00)
Glucose, Bld: 175 mg/dL — ABNORMAL HIGH (ref 70–99)
HCT: 52 % — ABNORMAL HIGH (ref 36.0–46.0)
Hemoglobin: 17.7 g/dL — ABNORMAL HIGH (ref 12.0–15.0)
Potassium: 6.1 mmol/L — ABNORMAL HIGH (ref 3.5–5.1)
Sodium: 136 mmol/L (ref 135–145)
TCO2: 25 mmol/L (ref 22–32)

## 2024-09-05 LAB — CK: Total CK: 598 U/L — ABNORMAL HIGH (ref 38–234)

## 2024-09-05 LAB — URINE DRUG SCREEN
Amphetamines: NEGATIVE
Barbiturates: NEGATIVE
Benzodiazepines: NEGATIVE
Cocaine: NEGATIVE
Fentanyl: NEGATIVE
Methadone Scn, Ur: NEGATIVE
Opiates: POSITIVE — AB
Tetrahydrocannabinol: NEGATIVE

## 2024-09-05 LAB — ETHANOL: Alcohol, Ethyl (B): <15 mg/dL (ref <15)

## 2024-09-05 LAB — GLUCOSE, CAPILLARY
Glucose-Capillary: 205 mg/dL — ABNORMAL HIGH (ref 70–99)
Glucose-Capillary: 182 mg/dL — ABNORMAL HIGH (ref 70–99)

## 2024-09-05 LAB — CBG MONITORING, ED: Glucose-Capillary: 207 mg/dL — ABNORMAL HIGH (ref 70–99)

## 2024-09-05 LAB — D-DIMER, QUANTITATIVE: D-Dimer, Quant: 0.71 ug{FEU}/mL — ABNORMAL HIGH (ref 0.00–0.50)

## 2024-09-05 LAB — MRSA NEXT GEN BY PCR, NASAL: MRSA by PCR Next Gen: NOT DETECTED

## 2024-09-05 LAB — SALICYLATE LEVEL: Salicylate Lvl: <7 mg/dL — ABNORMAL LOW (ref 7.0–30.0)

## 2024-09-05 MED ORDER — LORAZEPAM 2 MG/ML IJ SOLN
1.0000 mg | Freq: Once | INTRAMUSCULAR | Status: AC
  Administered 2024-09-05: 1 mg via INTRAVENOUS
  Filled 2024-09-05: qty 1

## 2024-09-05 MED ORDER — IOHEXOL 350 MG/ML SOLN
75.0000 mL | Freq: Once | INTRAVENOUS | Status: AC | PRN
  Administered 2024-09-05: 75 mL via INTRAVENOUS

## 2024-09-05 MED ORDER — POLYETHYLENE GLYCOL 3350 17 G PO PACK: 17.0000 g | PACK | Freq: Every day | ORAL | Status: DC | PRN

## 2024-09-05 MED ORDER — DOCUSATE SODIUM 100 MG PO CAPS: 100.0000 mg | ORAL_CAPSULE | Freq: Two times a day (BID) | ORAL | Status: DC | PRN

## 2024-09-05 MED ORDER — INSULIN ASPART 100 UNIT/ML IJ SOLN
0.0000 [IU] | Freq: Three times a day (TID) | INTRAMUSCULAR | Status: DC
  Administered 2024-09-06: 4 [IU] via SUBCUTANEOUS
  Filled 2024-09-05: qty 4

## 2024-09-05 MED ORDER — LACTATED RINGERS IV SOLN: INTRAVENOUS | Status: AC

## 2024-09-05 MED ORDER — SODIUM ZIRCONIUM CYCLOSILICATE 10 G PO PACK
10.0000 g | PACK | Freq: Once | ORAL | Status: AC
  Administered 2024-09-05: 10 g via ORAL
  Filled 2024-09-05: qty 1

## 2024-09-05 MED ORDER — NALOXONE HCL 0.4 MG/ML IJ SOLN
INTRAMUSCULAR | Status: AC
  Filled 2024-09-05: qty 3

## 2024-09-05 MED ORDER — CHLORHEXIDINE GLUCONATE CLOTH 2 % EX PADS
6.0000 | MEDICATED_PAD | Freq: Every day | CUTANEOUS | Status: DC
  Administered 2024-09-05: 6 via TOPICAL

## 2024-09-05 MED ORDER — NALOXONE HCL 0.4 MG/ML IJ SOLN
0.4000 mg | Freq: Once | INTRAMUSCULAR | Status: AC
  Administered 2024-09-05: 0.4 mg via INTRAVENOUS
  Filled 2024-09-05: qty 1

## 2024-09-05 MED ORDER — ORAL CARE MOUTH RINSE: 15.0000 mL | OROMUCOSAL | Status: DC | PRN

## 2024-09-05 MED ORDER — SODIUM CHLORIDE 0.9 % IV BOLUS
1000.0000 mL | Freq: Once | INTRAVENOUS | Status: AC
  Administered 2024-09-05: 1000 mL via INTRAVENOUS

## 2024-09-05 MED ORDER — ORAL CARE MOUTH RINSE: 15.0000 mL | OROMUCOSAL | Status: DC

## 2024-09-05 MED ORDER — PANTOPRAZOLE SODIUM 40 MG IV SOLR
40.0000 mg | Freq: Every day | INTRAVENOUS | Status: DC
  Administered 2024-09-05: 40 mg via INTRAVENOUS
  Filled 2024-09-05: qty 10

## 2024-09-05 MED ORDER — NALOXONE HCL 0.4 MG/ML IJ SOLN
0.4000 mg | INTRAMUSCULAR | Status: DC | PRN
  Administered 2024-09-05: 0.4 mg via INTRAVENOUS

## 2024-09-05 MED ORDER — INSULIN ASPART 100 UNIT/ML IJ SOLN
0.0000 [IU] | Freq: Every day | INTRAMUSCULAR | Status: DC
  Administered 2024-09-05: 2 [IU] via SUBCUTANEOUS
  Filled 2024-09-05: qty 2

## 2024-09-05 MED ORDER — HEPARIN SODIUM (PORCINE) 5000 UNIT/ML IJ SOLN
5000.0000 [IU] | Freq: Three times a day (TID) | INTRAMUSCULAR | Status: DC
  Administered 2024-09-05 – 2024-09-06 (×2): 5000 [IU] via SUBCUTANEOUS
  Filled 2024-09-05 (×2): qty 1

## 2024-09-05 NOTE — Progress Notes (Signed)
 Patient unresponsive and long periods of apnea noted. Hypopnea of 4 breaths per minute with constant stimulation and sternal rubs. BiPAP settings manipulated to increase volumes but no success. Vt double digits with 0.5 1.5 L/min Ve. RN and Elink engaged.

## 2024-09-05 NOTE — Progress Notes (Signed)
 eLink Physician-Brief Progress Note Patient Name: [name] DOB: 30-Jun-1976 MRN: [id number]   Date of Service  09/05/2024  HPI/Events of Note  Patient admitted to the ICU for close monitoring due to suspected narcotic overdose with altered mental status.  eICU Interventions  New Patient Evaluation.        [name] 09/05/2024, 9:36 PM

## 2024-09-05 NOTE — ED Triage Notes (Signed)
 BIB by EMS from home. Per EMS pt husband stated he had difficulty waking her up from nap. Pt appears to be very lethargic. Hx of migraines; had treatment yesterday but no Hx of reactions to migraine treatment. Pt is A&O, responsive but difficult to arouse. Takes prescribed Hydrocodone , states she only took her prescribed dose. EMS CBG 245.

## 2024-09-05 NOTE — Progress Notes (Signed)
 Patient transported from ED to room 1235.  Pt remained stable and comfortable on Bipap throughout trip.

## 2024-09-05 NOTE — ED Notes (Signed)
 Pt. Received Narcan & is now more alert. Able to respond to questions

## 2024-09-05 NOTE — ED Provider Notes (Signed)
 [facility] Provider Note   CSN: [id number] Arrival date & time: 09/05/24  1509     Patient presents with: Altered Mental Status and Fatigue   Stephnie Kaitlen Redford is a 48 y.o. female hx of migraines, rheumatoid arthritis, diabetes here presenting with altered mental status.  Per the husband, patient was snoring last day and apparently had some respiratory distress.  She works second shift and he noticed around 6 AM she went to bed and she was snoring a lot.  He had to go to work around 9 AM.  He came back around noon and checked on her and noticed that she is snoring and appears to be cyanotic.  Patient told me that she had headache last night.  She states that she did take 1 dose of her hydrocodone  but not more than usual.  She adamantly denies any overdose.  Patient did take her migraine medicine yesterday as well.      Altered Mental Status Presenting symptoms: confusion        Prior to Admission medications   Medication Sig Start Date End Date Taking? Authorizing Provider  atenolol  (TENORMIN ) 50 MG tablet Take by mouth. 04/07/11   [provider]  Bepotastine Besilate 1.5 % SOLN Place 1 drop into both eyes as needed (for allergies).     [provider]  botulinum toxin Type A (BOTOX) 200 units injection Provider to inject 155 units into the muscles of the head and neck every 3 months. Discard remainder 08/01/24   Onita Duos, MD  clonazePAM  (KLONOPIN ) 0.5 MG tablet Take 0.5 mg by mouth 2 (two) times daily as needed.    [provider]  cyclobenzaprine  (FLEXERIL ) 10 MG tablet TAKE 1 TABLET BY MOUTH DAILY AS NEEDED FOR MUSCLE SPASMS. 01/03/24   Penumalli, Vikram R, MD  DULoxetine  (CYMBALTA ) 60 MG capsule Take 1 capsule (60 mg total) by mouth daily. 08/22/24   Onita Duos, MD  eletriptan  (RELPAX ) 40 MG tablet Take 1 tablet (40 mg total) by mouth as needed for migraine or headache. May repeat in 2 hours if  headache persists or recurs. 01/30/24   Onita Duos, MD  ezetimibe-simvastatin (VYTORIN) 10-40 MG tablet Take 1 tablet by mouth at bedtime.    [provider]  FARXIGA 5 MG TABS tablet Take 5 mg by mouth daily.    [provider]  folic acid  (FOLVITE ) 1 MG tablet TAKE 2 TABLETS (2 MG TOTAL) BY MOUTH DAILY. 01/24/18   Dolphus Reiter, MD  HYDROcodone -acetaminophen  (NORCO) 7.5-325 MG tablet Take 1 tablet by mouth every 6 (six) hours as needed for moderate pain. Getting routinely from PCP.    [provider]  ibuprofen  (ADVIL ) 800 MG tablet Take 800 mg by mouth 3 (three) times daily.    [provider]  levocetirizine (XYZAL) 5 MG tablet Take 5 mg by mouth every evening. 02/21/18   [provider]  lidocaine -prilocaine  (EMLA ) cream Apply 1 application topically as needed. 12/07/21   Onita Duos, MD  montelukast (SINGULAIR) 10 MG tablet Take 10 mg by mouth at bedtime. 02/21/18   [provider]  MOUNJARO 7.5 MG/0.5ML Pen Inject 7.5 mg into the skin once a week. 08/19/24   [provider]  Multiple Vitamin (MULITIVITAMIN WITH MINERALS) TABS Take 1 tablet by mouth daily.    [provider]  omeprazole (PRILOSEC) 20 MG capsule Take 20 mg by mouth at bedtime.    [provider]  ondansetron  (ZOFRAN ) 4  MG tablet TAKE 1 TABLET BY MOUTH EVERY 8 HOURS AS NEEDED FOR NAUSEA AND VOMITING 05/15/23   Onita Duos, MD  ondansetron  (ZOFRAN -ODT) 4 MG disintegrating tablet Take 1 tablet (4 mg total) by mouth every 8 (eight) hours as needed for nausea or vomiting. 01/30/24   Onita Duos, MD  pregabalin  (LYRICA ) 50 MG capsule TAKE 1 CAPSULE BY MOUTH 3 TIMES DAILY. 06/27/23   Onita Duos, MD  propranolol  ER (INDERAL  LA) 80 MG 24 hr capsule TAKE 1 CAPSULE BY MOUTH EVERY DAY 01/01/24   Onita Duos, MD  QULIPTA  60 MG TABS TAKE 1 TABLET BY MOUTH EVERY DAY 04/29/24   Onita Duos, MD  Tofacitinib Citrate ER (XELJANZ XR) 11 MG TB24 Take 1 mg by mouth at bedtime. 10/14/19    [provider]    Allergies: Felodipine, Felodipine er, Metformin and related, Prednisone , Propoxyphene, Sumatriptan succinate, Etanercept, and Latex    Review of Systems  Psychiatric/Behavioral:  Positive for confusion.   All other systems reviewed and are negative.   Updated Vital Signs BP 112/80 (BP Location: Right Arm)   Pulse (!) 107   Temp 98.9 F (37.2 C) (Oral)   Resp (!) 105   LMP  (LMP Unknown)   SpO2 92%   Physical Exam Vitals and nursing note reviewed.  Constitutional:      Comments: Confused and altered  HENT:     Head: Normocephalic.     Nose: Nose normal.     Mouth/Throat:     Mouth: Mucous membranes are dry.  Eyes:     Extraocular Movements: Extraocular movements intact.     Pupils: Pupils are equal, round, and reactive to light.  Cardiovascular:     Rate and Rhythm: Regular rhythm. Tachycardia present.     Pulses: Normal pulses.     Heart sounds: Normal heart sounds.  Pulmonary:     Effort: Pulmonary effort is normal.     Breath sounds: Normal breath sounds.  Abdominal:     General: Abdomen is flat.     Palpations: Abdomen is soft.  Genitourinary:    Comments: I was able to examine her skin there is no obvious fentanyl  patches or skin breakdown Musculoskeletal:        General: Normal range of motion.     Cervical back: Normal range of motion and neck supple.     Comments: No obvious extremity trauma  Skin:    General: Skin is warm.     Capillary Refill: Capillary refill takes less than 2 seconds.  Neurological:     Comments: Patient is altered and confused but moving all extremities.  Patient unable to follow commands  Psychiatric:     Comments: Unable      (all labs ordered are listed, but only abnormal results are displayed) Labs Reviewed  CBC WITH DIFFERENTIAL/PLATELET - Abnormal; Notable for the following components:      Result Value   WBC 18.9 (*)    RBC 5.36 (*)    Hemoglobin 16.0 (*)    HCT 50.2 (*)    Neutro Abs 16.8  (*)    Monocytes Absolute 1.3 (*)    Abs Immature Granulocytes 0.12 (*)    All other components within normal limits  COMPREHENSIVE METABOLIC PANEL WITH GFR - Abnormal; Notable for the following components:   CO2 21 (*)    Glucose, Bld 160 (*)    BUN 33 (*)    Creatinine, Ser 1.25 (*)    Calcium 8.7 (*)  AST 62 (*)    Alkaline Phosphatase 127 (*)    GFR, Estimated 53 (*)    All other components within normal limits  URINE DRUG SCREEN - Abnormal; Notable for the following components:   Opiates POSITIVE (*)    All other components within normal limits  URINALYSIS, ROUTINE W REFLEX MICROSCOPIC - Abnormal; Notable for the following components:   APPearance HAZY (*)    Glucose, UA >=500 (*)    Protein, ur 30 (*)    Bacteria, UA RARE (*)    All other components within normal limits  CK - Abnormal; Notable for the following components:   Total CK 598 (*)    All other components within normal limits  D-DIMER, QUANTITATIVE - Abnormal; Notable for the following components:   D-Dimer, Quant 0.71 (*)    All other components within normal limits  BLOOD GAS, VENOUS - Abnormal; Notable for the following components:   pH, Ven 7.11 (*)    pCO2, Ven 78 (*)    pO2, Ven <31 (*)    Acid-base deficit 7.0 (*)    All other components within normal limits  CBG MONITORING, ED - Abnormal; Notable for the following components:   Glucose-Capillary 207 (*)    All other components within normal limits  I-STAT CHEM 8, ED - Abnormal; Notable for the following components:   Potassium 6.1 (*)    BUN 58 (*)    Creatinine, Ser 1.40 (*)    Glucose, Bld 175 (*)    Calcium, Ion 1.13 (*)    Hemoglobin 17.7 (*)    HCT 52.0 (*)    All other components within normal limits  PREGNANCY, URINE  ETHANOL  SALICYLATE LEVEL  ACETAMINOPHEN  LEVEL  I-STAT CHEM 8, ED    EKG: EKG Interpretation Date/Time:  Thursday September 05 2024 15:33:24 EST Ventricular Rate:  117 PR Interval:  153 QRS Duration:  101 QT  Interval:  330 QTC Calculation: 461 R Axis:   180  Text Interpretation: Sinus tachycardia Left posterior fascicular block Low voltage, precordial leads Consider anterior infarct No significant change since last tracing Confirmed by Patt Alm DEL [phone]) on 09/05/2024 4:02:21 PM  Radiology: CT Angio Chest PE W and/or Wo Contrast Result Date: 09/05/2024 CLINICAL DATA:  Shortness of breath EXAM: CT ANGIOGRAPHY CHEST WITH CONTRAST TECHNIQUE: Multidetector CT imaging of the chest was performed using the standard protocol during bolus administration of intravenous contrast. Multiplanar CT image reconstructions and MIPs were obtained to evaluate the vascular anatomy. RADIATION DOSE REDUCTION: This exam was performed according to the departmental dose-optimization program which includes automated exposure control, adjustment of the mA and/or kV according to patient size and/or use of iterative reconstruction technique. CONTRAST:  75mL OMNIPAQUE  IOHEXOL  350 MG/ML SOLN COMPARISON:  Chest x-ray 09/05/2024, chest CT 11/14/2012 FINDINGS: Cardiovascular: Satisfactory opacification of the pulmonary arteries to the segmental level. No evidence of pulmonary embolism. Nonaneurysmal aorta. No dissection is seen. Borderline cardiomegaly. No pericardial effusion. Mediastinum/Nodes: Patent trachea. No thyroid  mass. No suspicious lymph nodes. Esophagus is within normal limits. Lungs/Pleura: No consolidation, pleural effusion or pneumothorax. Scarring at the right apex. Patchy atelectatic changes in the bilateral lungs. Upper Abdomen: Hepatic steatosis.  No acute finding. Musculoskeletal: No acute osseous abnormality Review of the MIP images confirms the above findings. IMPRESSION: 1. Negative for acute pulmonary embolus or aortic dissection. 2. Borderline cardiomegaly. 3. Hepatic steatosis. Electronically Signed   By: Luke Bun M.D.   On: 09/05/2024 17:43   CT Cervical Spine Wo Contrast  Result Date: 09/05/2024 EXAM: CT  CERVICAL SPINE WITHOUT CONTRAST 09/05/2024 04:13:04 PM TECHNIQUE: CT of the cervical spine was performed without the administration of intravenous contrast. Multiplanar reformatted images are provided for review. Automated exposure control, iterative reconstruction, and/or weight based adjustment of the mA/kV was utilized to reduce the radiation dose to as low as reasonably achievable. COMPARISON: None available. CLINICAL HISTORY: Ataxia, cervical trauma Ataxia, cervical trauma FINDINGS: BONES AND ALIGNMENT: No acute fracture or traumatic malalignment. DEGENERATIVE CHANGES: No significant degenerative changes. SOFT TISSUES: No prevertebral soft tissue swelling. IMPRESSION: 1. No acute abnormality of the cervical spine. Electronically signed by: Norman Gatlin MD 09/05/2024 04:28 PM EST RP Workstation: [id number]   CT HEAD WO CONTRAST ( ) Result Date: 09/05/2024 EXAM: CT HEAD WITHOUT CONTRAST 09/05/2024 04:13:04 PM TECHNIQUE: CT of the head was performed without the administration of intravenous contrast. Automated exposure control, iterative reconstruction, and/or weight based adjustment of the mA/kV was utilized to reduce the radiation dose to as low as reasonably achievable. COMPARISON: 10 / 16 / 07 CLINICAL HISTORY: Mental status change, unknown cause. FINDINGS: BRAIN AND VENTRICLES: No acute hemorrhage. No evidence of acute infarct. No hydrocephalus. No extra-axial collection. No mass effect or midline shift. ORBITS: No acute abnormality. SINUSES: No acute abnormality. SOFT TISSUES AND SKULL: No acute soft tissue abnormality. No skull fracture. IMPRESSION: 1. No acute intracranial abnormality. Electronically signed by: Norman Gatlin MD 09/05/2024 04:27 PM EST RP Workstation: [id number]   DG Chest Port 1 View Result Date: 09/05/2024 EXAM: 1 AP VIEW(S) XRAY OF THE CHEST 09/05/2024 03:49:00 PM COMPARISON: 11/10/2022 CLINICAL HISTORY: SOB FINDINGS: LUNGS AND PLEURA: Low lung volumes. Elevated right  hemidiaphragm. No focal pulmonary opacity. No pleural effusion. No pneumothorax. HEART AND MEDIASTINUM: Prominence of the cardiac silhouette is likely artifactual due to AP technique and low lung volumes. BONES AND SOFT TISSUES: Cholecystectomy clips. No acute osseous abnormality. IMPRESSION: 1. No acute cardiopulmonary abnormality. Electronically signed by: Rogelia Myers MD 09/05/2024 04:15 PM EST RP Workstation: [id number]     Procedures   CRITICAL CARE Performed by: Alm VEAR Cave   Total critical care time: 45 minutes  Critical care time was exclusive of separately billable procedures and treating other patients.  Critical care was necessary to treat or prevent imminent or life-threatening deterioration.  Critical care was time spent personally by me on the following activities: development of treatment plan with patient and/or surrogate as well as nursing, discussions with consultants, evaluation of patient's response to treatment, examination of patient, obtaining history from patient or surrogate, ordering and performing treatments and interventions, ordering and review of laboratory studies, ordering and review of radiographic studies, pulse oximetry and re-evaluation of patient's condition.   Medications Ordered in the ED  naloxone [facility]) injection 0.4 mg (0.4 mg Intravenous Given 09/05/24 1549)  sodium chloride  0.9 % bolus 1,000 mL (1,000 mLs Intravenous New Bag/Given 09/05/24 1548)  iohexol  (OMNIPAQUE ) 350 MG/ML injection 75 mL (75 mLs Intravenous Contrast Given 09/05/24 1714)                                    Medical Decision Making Emberlyn Jilian West is a 48 y.o. female here with altered mental status.  Patient was hypoxic and tachycardic on arrival.  Differential is large.  I am concerned for possible drug overdose versus PE versus ACS versus rhabdomyolysis versus electrolyte abnormality versus pneumonia versus UTI.  Plan to get CBC and CMP and tox and  CT head and  cervical spine and CK level and urinalysis and chest x-ray.  5:54 PM Reviewed patient's labs and patient is positive for opiates.  Patient's pH is 7.1 and CO2 of 78.  Patient's D-dimer is elevated but CTA showed no PE.  Patient is started on CPAP.  Patient was given Narcan with minimal improvement.  Since patient is still altered and requiring CPAP, I consulted ICU to examine the patient.  Concern for drug overdose causing respiratory acidosis.   6:40 PM ICU to admit.   Problems Addressed: Accidental drug overdose, initial encounter: acute illness or injury Respiratory acidosis: acute illness or injury  Amount and/or Complexity of Data Reviewed Labs: ordered. Decision-making details documented in ED Course. Radiology: ordered and independent interpretation performed. Decision-making details documented in ED Course. ECG/medicine tests: ordered and independent interpretation performed. Decision-making details documented in ED Course.  Risk Prescription drug management. Decision regarding hospitalization.     Final diagnoses:  Respiratory acidosis  Accidental drug overdose, initial encounter    ED Discharge Orders     None          Patt Alm Macho, MD 09/05/24 1840

## 2024-09-05 NOTE — Progress Notes (Signed)
   09/05/24 2148  BiPAP/CPAP/SIPAP  BiPAP/CPAP/SIPAP Pt Type Adult  BiPAP/CPAP/SIPAP V60  Mask Type Full face mask  Dentures removed? Not applicable  Mask Size Medium  Set Rate 12 breaths/min  Respiratory Rate 24 breaths/min  IPAP 12 cmH20  EPAP 5 cmH2O  FiO2 (%) 50 %  Minute Ventilation 13.2  Leak 25  Tidal Volume (Vt) 542  Patient Home Machine No  Patient Home Mask No  Patient Home Tubing No  Auto Titrate No  Press High Alarm 30 cmH2O  Press Low Alarm 5 cmH2O  CPAP/SIPAP surface wiped down Yes  Device Plugged into RED Power Outlet Yes  BiPAP/CPAP /SiPAP Vitals  Resp (!) 24  Bilateral Breath Sounds Diminished  MEWS Score/Color  MEWS Score 6  MEWS Score Color Red

## 2024-09-05 NOTE — H&P (Addendum)
 NAME:  [name], MRN:  [id number], DOB:  January 21, 1976, LOS: 0 ADMISSION DATE:  09/05/2024, CONSULTATION DATE:  09/05/24 REFERRING MD:  Dr. Patt, CHIEF COMPLAINT:  Acute Hypercapnic Respiratory Failure   History of Present Illness:   [name] is a 48 y/o F with hx of migraines, rheumatoid arthritis, and diabetes who presents for acute metabolic encephalopathy due to unintentional narcotic overdose found to have acute hypercapnic respiratory failure requiring NIPPV now admitted to ICU for airway and hemodynamic monitoring.  The patient presents with confusion and altered mental status following increased hydrocodone  use for a migraine.  She experienced a severe migraine yesterday, leading her to take three tablets of 10 mg hydrocodone  over a six-hour period. Following this, she developed confusion, incoherence, and a 'dead stare.' She was difficult to rouse and had trouble concentrating, often losing her train of thought, which prompted a call to emergency services by her husband.  She has a history of snoring, particularly when congested, but a sleep study conducted several years ago was negative for sleep apnea.  She is currently going through perimenopause, experiencing hot flashes. No chest pain or shortness of breath.  Upon arrival to the ED, the patient was noted to be altered and lethargic, she was given narcan for suspected narcotic overdose with partial response. She was placed on BIPAP because her ABG showed evidence of acute hypercapnic respiratory failure. Per nursing staff and husband, the patient has been more arousable since those interventions were completed.  Pertinent  Medical History   Past Medical History:  Diagnosis Date   Abnormal uterine bleeding (AUB)    Allergic rhinitis, seasonal    Alopecia areata totalis    Anemia    Anxiety    Arthritis    Depression    GERD (gastroesophageal reflux disease)    Hypertension    followed by pcp    (10-11-2021  per pt has taken bp med previously approx. 15 yrs ago (2007)   Nonintractable chronic migraine    neurologist--- dr onita   OA (osteoarthritis)    OSA (obstructive sleep apnea)    study result in care everywhere 09-29-2018, moderate OSA , pt refused cpap   Peripheral neuropathy    Polycystic ovary disease    Post herpetic neuralgia    followed by dr maree--- pain clinic,  had severe shingles 2022   RA (rheumatoid arthritis) [facility])    rheumatologist-- dr m. patel;   multiple sites   Type 2 diabetes mellitus [facility])    endocrinologist-- dr m. levy;   (10-11-2021  pt uses Libre, checks 4 or more times daily;  fasting blood sugar-- 120--150)   Wears contact lenses    Significant Hospital Events: Including procedures, antibiotic start and stop dates in addition to other pertinent events   As above  Interim History / Subjective:  - As above in H&P  Objective    Blood pressure (!) 141/127, pulse (!) 124, temperature (!) 97.2 F (36.2 C), temperature source Axillary, resp. rate 15, weight 92.3 kg, SpO2 100%.    FiO2 (%):  [30 %-50 %] 50 %  No intake or output data in the 24 hours ending 09/05/24 2016 Filed Weights   09/05/24 2000  Weight: 92.3 kg    Examination: General: arouses to voice easily on my evaluation and able to participate in history taking, intermittently dozes off while talking to her husband HENT: anicteric sclera, well injected conjunctivae, pupils miotic but reactive to lights Lungs: CTAB Cardiovascular: Distant heart sounds  Abdomen: Soft, non-tender Extremities: without edema Neuro: Slightly drowsy, oriented x 3, follows commands, no focal deficits GU: Deferred.  Resolved problem list   Assessment and Plan   #Acute Hypercapnic Respiratory Failure: unintentional narcotic overdose in the setting of migraine headache. - Continue BiPAP overnight - Narcan as needed to meet RR > 10 breaths/min - Admit to [facility] ICU for airway monitoring overnight - Hold  all narcotic medications. - Repeat ABG in AM  #Stress Ulcer PPx: PPI  #Nutrition: NPO while on BiPAP, can do sips/meds  #Rhabdomyolysis: #AKI: likely pre-renal or due to rhabdomyolysis - IVF resuscitation with LR 125 cc/hr - Renally adjust medications - Avoid Nephrotoxins - Strict I/Os - Foley catheter  #Hyperkalemia: - STAT potassium re-check - Lokelma 10 mg x 1 - Temporizing measures as needed  #VTE ppx: Heparin subQ TID  #Leukocytosis: likely reactive due to acute presentation. - CTM  #DM: - Resistant sliding scale  #Acute Metabolic Encephalopathy: likely due to CO2 narcosis and unintentional narcotic overdose. UDS + opiates. - Supportive care as above  Disposition: ICU appropriate for airway watch.  Labs   CBC: Recent Labs  Lab 09/05/24 1625 09/05/24 1626  WBC 18.9*  --   NEUTROABS 16.8*  --   HGB 16.0* 17.7*  HCT 50.2* 52.0*  MCV 93.7  --   PLT 392  --     Basic Metabolic Panel: Recent Labs  Lab 09/05/24 1625 09/05/24 1626  NA 137 136  K 5.0 6.1*  CL 105 105  CO2 21*  --   GLUCOSE 160* 175*  BUN 33* 58*  CREATININE 1.25* 1.40*  CALCIUM 8.7*  --    GFR: CrCl cannot be calculated (Unknown ideal weight.). Recent Labs  Lab 09/05/24 1625  WBC 18.9*    Liver Function Tests: Recent Labs  Lab 09/05/24 1625  AST 62*  ALT 36  ALKPHOS 127*  BILITOT 0.6  PROT 7.0  ALBUMIN 3.9   No results for input(s): LIPASE, AMYLASE in the last 168 hours. No results for input(s): AMMONIA in the last 168 hours.  ABG    Component Value Date/Time   HCO3 24.8 09/05/2024 1625   TCO2 25 09/05/2024 1626   ACIDBASEDEF 7.0 (H) 09/05/2024 1625   O2SAT 36.7 09/05/2024 1625     Coagulation Profile: No results for input(s): INR, PROTIME in the last 168 hours.  Cardiac Enzymes: Recent Labs  Lab 09/05/24 1625  CKTOTAL 598*    HbA1C: Hgb A1c MFr Bld  Date/Time Value Ref Range Status  03/27/2017 11:11 AM 7.0 (H) 4.6 - 6.5 % Final     Comment:    Glycemic Control Guidelines for People with Diabetes:Non Diabetic:  <6%Goal of Therapy: <7%Additional Action Suggested:  >8%   12/21/2016 01:44 PM 7.1 (H) 4.6 - 6.5 % Final    Comment:    Glycemic Control Guidelines for People with Diabetes:Non Diabetic:  <6%Goal of Therapy: <7%Additional Action Suggested:  >8%     CBG: Recent Labs  Lab 09/05/24 1526  GLUCAP 207*    Review of Systems:   Not obtained.  Past Medical History:  She,  has a past medical history of Abnormal uterine bleeding (AUB), Allergic rhinitis, seasonal, Alopecia areata totalis, Anemia, Anxiety, Arthritis, Depression, GERD (gastroesophageal reflux disease), Hypertension, Nonintractable chronic migraine, OA (osteoarthritis), OSA (obstructive sleep apnea), Peripheral neuropathy, Polycystic ovary disease, Post herpetic neuralgia, RA (rheumatoid arthritis) (HCC), Type 2 diabetes mellitus (HCC), and Wears contact lenses.   Surgical History:   Past Surgical History:  Procedure Laterality  Date   DILATATION & CURETTAGE/HYSTEROSCOPY WITH MYOSURE N/A 08/26/2016   #2  Procedure: DILATATION & CURETTAGE/HYSTEROSCOPY WITH MYOSURE;  Surgeon: Ovid All, MD;  Location: [facility];  Service: Gynecology;  Laterality: N/A;   HYSTEROSCOPY N/A 10/13/2021   Procedure: HYSTEROSCOPY WITH HYDROTHERMAL ABLATION;  Surgeon: All Ovid, MD;  Location: [facility];  Service: Gynecology;  Laterality: N/A;   HYSTEROSCOPY WITH D & C  11/15/2010   @[facility]    LAPAROSCOPIC CHOLECYSTECTOMY  04/24/2003   @[facility]    NASAL SEPTUM SURGERY  2002   WISDOM TOOTH EXTRACTION       Social History:   reports that she has never smoked. She has never used smokeless tobacco. She reports that she does not currently use alcohol. She reports that she does not use drugs.   Family History:  Her family history includes Brain cancer in her father; Breast cancer in her mother; Heart failure in her maternal grandmother; Hyperlipidemia in her father,  maternal grandmother, and mother; Hypertension in her maternal grandmother, mother, and sister.   Allergies Allergies  Allergen Reactions   Felodipine     Other Reaction(s): Not available, Unknown  Plendil   Felodipine Er Swelling and Other (See Comments)    Reaction:  Lower extremity swelling    Metformin And Related     Diarrhea and vomitting    Prednisone  Other (See Comments) and Swelling    Reaction:  Facial swelling ;  pt stated if takes longer than 10 days will start getting Cushing's syndrome symptoms  Other Reaction(s): Not available, Unknown  prednisone    Propoxyphene Nausea Only   Sumatriptan Succinate     Other Reaction(s): Unknown   Etanercept Rash    Other Reaction(s): Unknown   Latex Rash    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 Result was a rash.right. FABIENE Ned, RN  Other Reaction(s): Unknown     Home Medications  Prior to Admission medications   Medication Sig Start Date End Date Taking? Authorizing Provider  atenolol  (TENORMIN ) 50 MG tablet Take by mouth. 04/07/11   [provider]  Bepotastine Besilate 1.5 % SOLN Place 1 drop into both eyes as needed (for allergies).     [provider]  botulinum toxin Type A (BOTOX) 200 units injection Provider to inject 155 units into the muscles of the head and neck every 3 months. Discard remainder 08/01/24   Onita Duos, MD  clonazePAM  (KLONOPIN ) 0.5 MG tablet Take 0.5 mg by mouth 2 (two) times daily as needed.    [provider]  cyclobenzaprine  (FLEXERIL ) 10 MG tablet TAKE 1 TABLET BY MOUTH DAILY AS NEEDED FOR MUSCLE SPASMS. 01/03/24   Penumalli, Eduard SAUNDERS, MD  DULoxetine  (CYMBALTA ) 60 MG capsule Take 1 capsule (60 mg total) by mouth daily. 08/22/24   Onita Duos, MD  eletriptan  (RELPAX ) 40 MG tablet Take 1 tablet (40 mg total) by mouth as needed for migraine or headache. May repeat in 2 hours if headache persists or recurs.  01/30/24   Onita Duos, MD  ezetimibe-simvastatin (VYTORIN) 10-40 MG tablet Take 1 tablet by mouth at bedtime.    [provider]  FARXIGA 5 MG TABS tablet Take 5 mg by mouth daily.    [provider]  folic acid  (FOLVITE ) 1 MG tablet TAKE 2 TABLETS (2 MG TOTAL) BY MOUTH DAILY. 01/24/18   Dolphus Reiter, MD  HYDROcodone -acetaminophen  (NORCO) 7.5-325 MG tablet Take 1 tablet by  mouth every 6 (six) hours as needed for moderate pain. Getting routinely from PCP.    [provider]  ibuprofen  (ADVIL ) 800 MG tablet Take 800 mg by mouth 3 (three) times daily.    [provider]  levocetirizine (XYZAL) 5 MG tablet Take 5 mg by mouth every evening. 02/21/18   [provider]  lidocaine -prilocaine  (EMLA ) cream Apply 1 application topically as needed. 12/07/21   Onita Duos, MD  montelukast (SINGULAIR) 10 MG tablet Take 10 mg by mouth at bedtime. 02/21/18   [provider]  MOUNJARO 7.5 MG/0.5ML Pen Inject 7.5 mg into the skin once a week. 08/19/24   [provider]  Multiple Vitamin (MULITIVITAMIN WITH MINERALS) TABS Take 1 tablet by mouth daily.    [provider]  omeprazole (PRILOSEC) 20 MG capsule Take 20 mg by mouth at bedtime.    [provider]  ondansetron  (ZOFRAN ) 4 MG tablet TAKE 1 TABLET BY MOUTH EVERY 8 HOURS AS NEEDED FOR NAUSEA AND VOMITING 05/15/23   Onita Duos, MD  ondansetron  (ZOFRAN -ODT) 4 MG disintegrating tablet Take 1 tablet (4 mg total) by mouth every 8 (eight) hours as needed for nausea or vomiting. 01/30/24   Onita Duos, MD  pregabalin  (LYRICA ) 50 MG capsule TAKE 1 CAPSULE BY MOUTH 3 TIMES DAILY. 06/27/23   Onita Duos, MD  propranolol  ER (INDERAL  LA) 80 MG 24 hr capsule TAKE 1 CAPSULE BY MOUTH EVERY DAY 01/01/24   Onita Duos, MD  QULIPTA  60 MG TABS TAKE 1 TABLET BY MOUTH EVERY DAY 04/29/24   Onita Duos, MD  Tofacitinib Citrate ER (XELJANZ XR) 11 MG TB24 Take 1 mg by mouth at bedtime. 10/14/19   [provider]      Due to a high probability of clinically significant, life threatening deterioration, the patient required my highest level of preparedness to intervene emergently and I personally spent this critical care time directly and personally managing the patient. This critical care time included obtaining a history; examining the patient; pulse oximetry; ordering and review of studies; arranging urgent treatment with development of a management plan; evaluation of patient's response to treatment; frequent reassessment; and, discussions with other providers.  This critical care time was performed to assess and manage the high probability of imminent, life-threatening deterioration that could result in multi-organ failure. It was exclusive of separately billable procedures and treating other patients and teaching time. Critical Care Time: 45 minutes.  Paula Southerly, MD [facility]

## 2024-09-05 NOTE — Progress Notes (Addendum)
 eLink Physician-Brief Progress Note Patient Name: Litzi Binning DOB: Jan 20, 1976 MRN: [id number]   Date of Service  09/05/2024  HPI/Events of Note  Patient unresponsive from suspected narcotic overdose.  eICU Interventions  Narcan 0.4 mg iv Q 5 minutes PRN x 2 doses.     Intervention Category Evaluation Type: New Patient Evaluation  Marcellina RAYMOND Dub 09/05/2024, 9:40 PM

## 2024-09-06 ENCOUNTER — Encounter (HOSPITAL_COMMUNITY): Payer: Self-pay | Admitting: Pulmonary Disease

## 2024-09-06 ENCOUNTER — Other Ambulatory Visit: Payer: Self-pay

## 2024-09-06 DIAGNOSIS — J9601 Acute respiratory failure with hypoxia: Secondary | ICD-10-CM

## 2024-09-06 LAB — BLOOD GAS, ARTERIAL
Acid-base deficit: 7 mmol/L — ABNORMAL HIGH (ref 0.0–2.0)
Bicarbonate: 19.7 mmol/L — ABNORMAL LOW (ref 20.0–28.0)
Delivery systems: POSITIVE
Drawn by: 42246
Expiratory PAP: 5 cmH2O
FIO2: 40 %
Inspiratory PAP: 16 cmH2O
Mode: POSITIVE
O2 Saturation: 98.9 %
Patient temperature: 37.8
pCO2 arterial: 44 mmHg (ref 32–48)
pH, Arterial: 7.26 — ABNORMAL LOW (ref 7.35–7.45)
pO2, Arterial: 102 mmHg (ref 83–108)

## 2024-09-06 LAB — CBC WITH DIFFERENTIAL/PLATELET
Abs Immature Granulocytes: 0.15 K/uL — ABNORMAL HIGH (ref 0.00–0.07)
Basophils Absolute: 0.1 K/uL (ref 0.0–0.1)
Basophils Relative: 0 %
Eosinophils Absolute: 0 K/uL (ref 0.0–0.5)
Eosinophils Relative: 0 %
HCT: 51.8 % — ABNORMAL HIGH (ref 36.0–46.0)
Hemoglobin: 16.6 g/dL — ABNORMAL HIGH (ref 12.0–15.0)
Immature Granulocytes: 1 %
Lymphocytes Relative: 2 %
Lymphs Abs: 0.4 K/uL — ABNORMAL LOW (ref 0.7–4.0)
MCH: 29.5 pg (ref 26.0–34.0)
MCHC: 32 g/dL (ref 30.0–36.0)
MCV: 92 fL (ref 80.0–100.0)
Monocytes Absolute: 1.3 K/uL — ABNORMAL HIGH (ref 0.1–1.0)
Monocytes Relative: 7 %
Neutro Abs: 16.9 K/uL — ABNORMAL HIGH (ref 1.7–7.7)
Neutrophils Relative %: 90 %
Platelets: 351 K/uL (ref 150–400)
RBC: 5.63 MIL/uL — ABNORMAL HIGH (ref 3.87–5.11)
RDW: 13.6 % (ref 11.5–15.5)
WBC: 18.8 K/uL — ABNORMAL HIGH (ref 4.0–10.5)
nRBC: 0.1 % (ref 0.0–0.2)

## 2024-09-06 LAB — COMPREHENSIVE METABOLIC PANEL WITH GFR
ALT: 43 U/L (ref 0–44)
AST: 60 U/L — ABNORMAL HIGH (ref 15–41)
Albumin: 4.1 g/dL (ref 3.5–5.0)
Alkaline Phosphatase: 140 U/L — ABNORMAL HIGH (ref 38–126)
Anion gap: 15 (ref 5–15)
BUN: 30 mg/dL — ABNORMAL HIGH (ref 6–20)
CO2: 18 mmol/L — ABNORMAL LOW (ref 22–32)
Calcium: 9.4 mg/dL (ref 8.9–10.3)
Chloride: 105 mmol/L (ref 98–111)
Creatinine, Ser: 0.96 mg/dL (ref 0.44–1.00)
GFR, Estimated: >60 mL/min (ref >60)
Glucose, Bld: 175 mg/dL — ABNORMAL HIGH (ref 70–99)
Potassium: 4.3 mmol/L (ref 3.5–5.1)
Sodium: 138 mmol/L (ref 135–145)
Total Bilirubin: 1.2 mg/dL (ref 0.0–1.2)
Total Protein: 7.7 g/dL (ref 6.5–8.1)

## 2024-09-06 LAB — HEMOGLOBIN A1C
Hgb A1c MFr Bld: 7.5 % — ABNORMAL HIGH (ref 4.8–5.6)
Mean Plasma Glucose: 168.55 mg/dL

## 2024-09-06 LAB — GLUCOSE, CAPILLARY
Glucose-Capillary: 163 mg/dL — ABNORMAL HIGH (ref 70–99)
Glucose-Capillary: 152 mg/dL — ABNORMAL HIGH (ref 70–99)

## 2024-09-06 LAB — CK: Total CK: 973 U/L — ABNORMAL HIGH (ref 38–234)

## 2024-09-06 LAB — HIV ANTIBODY (ROUTINE TESTING W REFLEX): HIV Screen 4th Generation wRfx: NONREACTIVE

## 2024-09-06 MED ORDER — NALOXONE HCL 4 MG/0.1ML NA LIQD: NASAL | 3 refills | Status: AC

## 2024-09-06 MED ORDER — ACETAMINOPHEN 650 MG RE SUPP: 650.0000 mg | RECTAL | Status: DC | PRN

## 2024-09-06 MED ORDER — ACETAMINOPHEN 325 MG PO TABS
650.0000 mg | ORAL_TABLET | ORAL | Status: DC | PRN
  Administered 2024-09-06: 650 mg via ORAL
  Filled 2024-09-06: qty 2

## 2024-09-06 NOTE — Discharge Instructions (Addendum)
-   Please STOP taking hydromorphone  for headaches - Talk to your neurologist about other options for migraines - I prescribed a narcan nasal spray to use as needed whenever you or your husband are sleepy from pain medication - Please see your primary care provider within two weeks of discharge to re-check your potassium and kidney levels

## 2024-09-06 NOTE — Plan of Care (Signed)

## 2024-09-06 NOTE — Progress Notes (Signed)
   09/06/24 1141  TOC Brief Assessment  Insurance and Status Reviewed  Patient has primary care physician Yes  Home environment has been reviewed Home with spouse  Prior level of function: Independent with ADL's  Prior/Current Home Services No current home services  Social Drivers of Health Review SDOH reviewed no interventions necessary  Readmission risk has been reviewed Yes  Transition of care needs no transition of care needs at this time

## 2024-09-06 NOTE — Discharge Summary (Signed)
 Physician Discharge Summary  [name] MRN: [id number] DOB/AGE: 49-Mar-1977 49 y.o.  Admit date: 09/05/2024 Discharge date: 09/06/2024  Admission Diagnoses:  Discharge Diagnoses:  Principal Problem:   Acute hypoxic respiratory failure [facility])   Discharged Condition: stable  Hospital Course:   Mrs. Dimichele is a 48 y/o F with hx of migraines, rheumatoid arthritis, and diabetes who presents for acute metabolic encephalopathy due to unintentional narcotic overdose found to have acute hypercapnic respiratory failure requiring NIPPV now admitted to ICU for airway and hemodynamic monitoring.   The patient presents with confusion and altered mental status following increased hydrocodone  use for a migraine.   She experienced a severe migraine yesterday, leading her to take three tablets of 10 mg hydrocodone  over a six-hour period. Following this, she developed confusion, incoherence, and a 'dead stare.' She was difficult to rouse and had trouble concentrating, often losing her train of thought, which prompted a call to emergency services by her husband.   She has a history of snoring, particularly when congested, but a sleep study conducted several years ago was negative for sleep apnea.   She is currently going through perimenopause, experiencing hot flashes. No chest pain or shortness of breath.   Upon arrival to the ED, the patient was noted to be altered and lethargic, she was given narcan for suspected narcotic overdose with partial response. She was placed on BIPAP because her ABG showed evidence of acute hypercapnic respiratory failure. Per nursing staff and husband, the patient has been more arousable since those interventions were completed.  She was admitted overnight to ICU for airway monitoring. She did have a period of apnea overnight and alteration of mental status and was given another dose of narcan. She was kept on BiPAP overnight for acute  hypercapnic respiratory failure in the setting of unintentional narcotic overdose. This morning, she is doing well off BiPAP and is more alert and coherent. She passed a swallow test and did well with food intake. Her ABG in the AM showed improvement in PCO2 from 78 to 44. I did discuss with patient that she should hold off on using her narcotic pain medication and discuss with neurologist other options. I also prescribed a narcan nasal spray and instructed patient on how to use it. She did have hyperkalemia and AKI due to mild rhabdomyolysis that has resolved. I encouraged patient to drink plenty of water.  #Acute Hypercapnic Respiratory Failure: unintentional narcotic overdose in the setting of migraine headache. Resolved. - Avoid narcotic medications - See neurologist as an outpatient to discuss other migraine headache options - Narcan nasal spray as needed for drug intoxication   #Rhabdomyolysis: #Hyperkalemia: Resolved #AKI: Resolved. - Encouraged PO intake  - See PCP for repeat labs in two weeks   #DM: - Resume home medications  Consults: pulmonary/intensive care  Significant Diagnostic Studies: N/A  Treatments: as above.  Discharge Exam: Blood pressure 121/67, pulse (!) 122, temperature 98.5 F (36.9 C), temperature source Oral, resp. rate 19, height 5' 2 (1.575 m), weight 92.3 kg, SpO2 95%. General: NAD, alert, WD, WN Eyes: PERRL, no scleral icterus ENMT: oropharynx clear, good dentition, no oral lesions, mallampati score IV Skin: warm, intact, no rashes Neck: JVD flat, ROM and lymph node assessment normal CV: RRR, no MRG, nl S1 and S2, no peripheral edema Resp: clear to auscultation bilaterally, no wheezes, rales, or rhonchi, normal effort, no clubbing/cyanosis Abdom: Normoactive bowel sounds, soft, nontender, nondistended, no hepatosplenomegaly Neuro: Awake alert oriented to person place time  and situation  Disposition: Stable for discharge home.  Allergies as of  09/06/2024       Reactions   Felodipine    Other Reaction(s): Not available, Unknown Plendil   Felodipine Er Swelling, Other (See Comments)   Reaction:  Lower extremity swelling    Metformin And Related    Diarrhea and vomitting    Prednisone  Other (See Comments), Swelling   Reaction:  Facial swelling ;  pt stated if takes longer than 10 days will start getting Cushing's syndrome symptoms Other Reaction(s): Not available, Unknown prednisone    Propoxyphene Nausea Only   Sumatriptan Succinate    Other Reaction(s): Unknown   Etanercept Rash   Other Reaction(s): Unknown   Latex Rash   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 Result was a rash.right. FABIENE Ned, RN Other Reaction(s): Unknown        Medication List     STOP taking these medications    HYDROcodone -acetaminophen  7.5-325 MG tablet Commonly known as: NORCO       TAKE these medications    atenolol  50 MG tablet Commonly known as: TENORMIN  Take by mouth.   Bepotastine Besilate 1.5 % Soln Place 1 drop into both eyes as needed (for allergies).   botulinum toxin Type A 200 units injection Commonly known as: BOTOX Provider to inject 155 units into the muscles of the head and neck every 3 months. Discard remainder   clonazePAM  0.5 MG tablet Commonly known as: KLONOPIN  Take 0.5 mg by mouth 2 (two) times daily as needed.   cyclobenzaprine  10 MG tablet Commonly known as: FLEXERIL  TAKE 1 TABLET BY MOUTH DAILY AS NEEDED FOR MUSCLE SPASMS.   DULoxetine  60 MG capsule Commonly known as: CYMBALTA  Take 1 capsule (60 mg total) by mouth daily.   eletriptan  40 MG tablet Commonly known as: RELPAX  Take 1 tablet (40 mg total) by mouth as needed for migraine or headache. May repeat in 2 hours if headache persists or recurs.   ezetimibe-simvastatin 10-40 MG tablet Commonly known as: VYTORIN Take 1 tablet by mouth at bedtime.   Farxiga 5 MG Tabs  tablet Generic drug: dapagliflozin propanediol Take 5 mg by mouth daily.   folic acid  1 MG tablet Commonly known as: FOLVITE  TAKE 2 TABLETS (2 MG TOTAL) BY MOUTH DAILY.   ibuprofen  800 MG tablet Commonly known as: ADVIL  Take 800 mg by mouth 3 (three) times daily.   levocetirizine 5 MG tablet Commonly known as: XYZAL Take 5 mg by mouth every evening.   lidocaine -prilocaine  cream Commonly known as: EMLA  Apply 1 application topically as needed.   montelukast 10 MG tablet Commonly known as: SINGULAIR Take 10 mg by mouth at bedtime.   Mounjaro 7.5 MG/0.5ML Pen Generic drug: tirzepatide Inject 7.5 mg into the skin once a week.   multivitamin with minerals Tabs tablet Take 1 tablet by mouth daily.   naloxone 4 MG/0.1ML Liqd nasal spray kit Commonly known as: NARCAN Use when sleepy as needed from pain medications   omeprazole 20 MG capsule Commonly known as: PRILOSEC Take 20 mg by mouth at bedtime.   ondansetron  4 MG disintegrating tablet Commonly known as: ZOFRAN -ODT Take 1 tablet (4 mg total) by mouth every 8 (eight) hours as needed for nausea or vomiting.   ondansetron  4 MG tablet Commonly known as: ZOFRAN  TAKE 1 TABLET BY MOUTH EVERY 8 HOURS AS NEEDED FOR NAUSEA AND VOMITING   pregabalin  50 MG capsule  Commonly known as: LYRICA  TAKE 1 CAPSULE BY MOUTH 3 TIMES DAILY.   propranolol  ER 80 MG 24 hr capsule Commonly known as: INDERAL  LA TAKE 1 CAPSULE BY MOUTH EVERY DAY   Qulipta  60 MG Tabs Generic drug: Atogepant  TAKE 1 TABLET BY MOUTH EVERY DAY   Xeljanz XR 11 MG Tb24 Generic drug: Tofacitinib Citrate ER Take 1 mg by mouth at bedtime.        I spent 30 minutes in discharge related activities including discussing outpatient care with patient and charting.  Signed: Sonika Levins 09/06/2024, 11:05 AM

## 2024-11-22 ENCOUNTER — Other Ambulatory Visit: Payer: Self-pay | Admitting: Neurology

## 2024-11-28 ENCOUNTER — Ambulatory Visit: Admitting: Neurology

## 2024-11-28 ENCOUNTER — Telehealth: Payer: Self-pay | Admitting: Neurology

## 2024-11-28 NOTE — Telephone Encounter (Signed)
 Pt called to cancel appt to day due to  car is not working   Appt rescheduled

## 2024-12-10 ENCOUNTER — Ambulatory Visit: Admitting: Neurology

## 2025-03-05 ENCOUNTER — Ambulatory Visit: Admitting: Neurology
